# Patient Record
Sex: Male | Born: 1950
Health system: Southern US, Community
[De-identification: ages and names within clinical notes are randomized; demographics above are authoritative.]

## PROBLEM LIST (undated history)

## (undated) DIAGNOSIS — I639 Cerebral infarction, unspecified: Secondary | ICD-10-CM

## (undated) HISTORY — DX: Cerebral infarction, unspecified: I63.9

---

## 2019-12-31 ENCOUNTER — Emergency Department: Payer: Medicare Other

## 2019-12-31 ENCOUNTER — Inpatient Hospital Stay
Admission: EM | Admit: 2019-12-31 | Discharge: 2020-01-09 | DRG: 064 | Disposition: A | Discharge status: Rehabilitation Facility | Payer: Medicare Other | Source: Skilled Nursing Facility | Attending: Internal Medicine | Admitting: Internal Medicine

## 2019-12-31 ENCOUNTER — Other Ambulatory Visit: Payer: Self-pay

## 2019-12-31 DIAGNOSIS — J9811 Atelectasis: Secondary | ICD-10-CM | POA: Diagnosis present

## 2019-12-31 DIAGNOSIS — Z23 Encounter for immunization: Secondary | ICD-10-CM | POA: Diagnosis not present

## 2019-12-31 DIAGNOSIS — A09 Infectious gastroenteritis and colitis, unspecified: Secondary | ICD-10-CM | POA: Diagnosis present

## 2019-12-31 DIAGNOSIS — N179 Acute kidney failure, unspecified: Secondary | ICD-10-CM | POA: Diagnosis present

## 2019-12-31 DIAGNOSIS — H1032 Unspecified acute conjunctivitis, left eye: Secondary | ICD-10-CM | POA: Diagnosis present

## 2019-12-31 DIAGNOSIS — E872 Acidosis, unspecified: Secondary | ICD-10-CM

## 2019-12-31 DIAGNOSIS — N400 Enlarged prostate without lower urinary tract symptoms: Secondary | ICD-10-CM | POA: Diagnosis not present

## 2019-12-31 DIAGNOSIS — R338 Other retention of urine: Secondary | ICD-10-CM | POA: Diagnosis present

## 2019-12-31 DIAGNOSIS — G463 Brain stem stroke syndrome: Secondary | ICD-10-CM | POA: Diagnosis not present

## 2019-12-31 DIAGNOSIS — I63542 Cerebral infarction due to unspecified occlusion or stenosis of left cerebellar artery: Secondary | ICD-10-CM | POA: Diagnosis present

## 2019-12-31 DIAGNOSIS — R1312 Dysphagia, oropharyngeal phase: Secondary | ICD-10-CM | POA: Diagnosis not present

## 2019-12-31 DIAGNOSIS — N401 Enlarged prostate with lower urinary tract symptoms: Secondary | ICD-10-CM | POA: Diagnosis present

## 2019-12-31 DIAGNOSIS — E876 Hypokalemia: Secondary | ICD-10-CM | POA: Diagnosis present

## 2019-12-31 DIAGNOSIS — H532 Diplopia: Secondary | ICD-10-CM | POA: Diagnosis not present

## 2019-12-31 DIAGNOSIS — G510 Bell's palsy: Secondary | ICD-10-CM | POA: Diagnosis not present

## 2019-12-31 DIAGNOSIS — H9190 Unspecified hearing loss, unspecified ear: Secondary | ICD-10-CM | POA: Diagnosis not present

## 2019-12-31 DIAGNOSIS — H5500 Unspecified nystagmus: Secondary | ICD-10-CM | POA: Diagnosis present

## 2019-12-31 DIAGNOSIS — K5900 Constipation, unspecified: Secondary | ICD-10-CM | POA: Diagnosis present

## 2019-12-31 DIAGNOSIS — G9341 Metabolic encephalopathy: Secondary | ICD-10-CM | POA: Diagnosis present

## 2019-12-31 DIAGNOSIS — R414 Neurologic neglect syndrome: Secondary | ICD-10-CM | POA: Diagnosis present

## 2019-12-31 DIAGNOSIS — I69391 Dysphagia following cerebral infarction: Secondary | ICD-10-CM

## 2019-12-31 DIAGNOSIS — K529 Noninfective gastroenteritis and colitis, unspecified: Secondary | ICD-10-CM

## 2019-12-31 DIAGNOSIS — I2510 Atherosclerotic heart disease of native coronary artery without angina pectoris: Secondary | ICD-10-CM | POA: Diagnosis present

## 2019-12-31 DIAGNOSIS — I639 Cerebral infarction, unspecified: Secondary | ICD-10-CM

## 2019-12-31 DIAGNOSIS — F0150 Vascular dementia without behavioral disturbance: Secondary | ICD-10-CM | POA: Diagnosis not present

## 2019-12-31 DIAGNOSIS — I10 Essential (primary) hypertension: Secondary | ICD-10-CM

## 2019-12-31 DIAGNOSIS — R569 Unspecified convulsions: Secondary | ICD-10-CM | POA: Diagnosis present

## 2019-12-31 DIAGNOSIS — A419 Sepsis, unspecified organism: Secondary | ICD-10-CM

## 2019-12-31 DIAGNOSIS — I69354 Hemiplegia and hemiparesis following cerebral infarction affecting left non-dominant side: Secondary | ICD-10-CM | POA: Diagnosis present

## 2019-12-31 DIAGNOSIS — R471 Dysarthria and anarthria: Secondary | ICD-10-CM | POA: Diagnosis present

## 2019-12-31 DIAGNOSIS — R4182 Altered mental status, unspecified: Secondary | ICD-10-CM

## 2019-12-31 DIAGNOSIS — Z20822 Contact with and (suspected) exposure to covid-19: Secondary | ICD-10-CM | POA: Diagnosis present

## 2019-12-31 DIAGNOSIS — E785 Hyperlipidemia, unspecified: Secondary | ICD-10-CM

## 2019-12-31 DIAGNOSIS — I63531 Cerebral infarction due to unspecified occlusion or stenosis of right posterior cerebral artery: Secondary | ICD-10-CM | POA: Diagnosis not present

## 2019-12-31 DIAGNOSIS — R29724 NIHSS score 24: Secondary | ICD-10-CM | POA: Diagnosis not present

## 2019-12-31 DIAGNOSIS — I6389 Other cerebral infarction: Secondary | ICD-10-CM | POA: Diagnosis not present

## 2019-12-31 DIAGNOSIS — M79671 Pain in right foot: Secondary | ICD-10-CM | POA: Diagnosis not present

## 2019-12-31 DIAGNOSIS — K5901 Slow transit constipation: Secondary | ICD-10-CM | POA: Diagnosis not present

## 2019-12-31 DIAGNOSIS — R110 Nausea: Secondary | ICD-10-CM | POA: Diagnosis not present

## 2019-12-31 DIAGNOSIS — G450 Vertebro-basilar artery syndrome: Secondary | ICD-10-CM | POA: Diagnosis not present

## 2019-12-31 DIAGNOSIS — Z79899 Other long term (current) drug therapy: Secondary | ICD-10-CM

## 2019-12-31 DIAGNOSIS — E860 Dehydration: Secondary | ICD-10-CM

## 2019-12-31 DIAGNOSIS — I69318 Other symptoms and signs involving cognitive functions following cerebral infarction: Secondary | ICD-10-CM | POA: Diagnosis not present

## 2019-12-31 DIAGNOSIS — R739 Hyperglycemia, unspecified: Secondary | ICD-10-CM | POA: Diagnosis present

## 2019-12-31 DIAGNOSIS — Z7982 Long term (current) use of aspirin: Secondary | ICD-10-CM

## 2019-12-31 DIAGNOSIS — Z7902 Long term (current) use of antithrombotics/antiplatelets: Secondary | ICD-10-CM

## 2019-12-31 DIAGNOSIS — H510 Palsy (spasm) of conjugate gaze: Secondary | ICD-10-CM | POA: Diagnosis present

## 2019-12-31 DIAGNOSIS — N39498 Other specified urinary incontinence: Secondary | ICD-10-CM | POA: Diagnosis not present

## 2019-12-31 DIAGNOSIS — R000 Tachycardia, unspecified: Secondary | ICD-10-CM | POA: Diagnosis not present

## 2019-12-31 DIAGNOSIS — I69320 Aphasia following cerebral infarction: Secondary | ICD-10-CM | POA: Diagnosis not present

## 2019-12-31 DIAGNOSIS — G8929 Other chronic pain: Secondary | ICD-10-CM | POA: Diagnosis not present

## 2019-12-31 DIAGNOSIS — R5381 Other malaise: Secondary | ICD-10-CM | POA: Diagnosis present

## 2019-12-31 DIAGNOSIS — I69328 Other speech and language deficits following cerebral infarction: Secondary | ICD-10-CM | POA: Diagnosis not present

## 2019-12-31 DIAGNOSIS — R6520 Severe sepsis without septic shock: Secondary | ICD-10-CM | POA: Diagnosis present

## 2019-12-31 DIAGNOSIS — I69398 Other sequelae of cerebral infarction: Secondary | ICD-10-CM | POA: Diagnosis not present

## 2019-12-31 DIAGNOSIS — A4151 Sepsis due to Escherichia coli [E. coli]: Secondary | ICD-10-CM | POA: Diagnosis present

## 2019-12-31 DIAGNOSIS — H5589 Other irregular eye movements: Secondary | ICD-10-CM | POA: Diagnosis present

## 2019-12-31 DIAGNOSIS — D62 Acute posthemorrhagic anemia: Secondary | ICD-10-CM | POA: Diagnosis not present

## 2019-12-31 DIAGNOSIS — I69322 Dysarthria following cerebral infarction: Secondary | ICD-10-CM | POA: Diagnosis not present

## 2019-12-31 LAB — CBC
HCT: 42.5 % (ref 39.0–52.0)
Hemoglobin: 14.6 g/dL (ref 13.0–17.0)
MCH: 30.6 pg (ref 26.0–34.0)
MCHC: 34.4 g/dL (ref 30.0–36.0)
MCV: 89.1 fL (ref 80.0–100.0)
Platelets: 382 10*3/uL (ref 150–400)
RBC: 4.77 MIL/uL (ref 4.22–5.81)
RDW: 12.8 % (ref 11.5–15.5)
WBC: 10.5 10*3/uL (ref 4.0–10.5)
nRBC: 0 % (ref 0.0–0.2)

## 2019-12-31 LAB — APTT: aPTT: 29 seconds (ref 24–36)

## 2019-12-31 LAB — COMPREHENSIVE METABOLIC PANEL
ALT: 19 U/L (ref 0–44)
AST: 22 U/L (ref 15–41)
Albumin: 3.7 g/dL (ref 3.5–5.0)
Alkaline Phosphatase: 98 U/L (ref 38–126)
Anion gap: 12 (ref 5–15)
BUN: 23 mg/dL (ref 8–23)
CO2: 19 mmol/L — ABNORMAL LOW (ref 22–32)
Calcium: 9.6 mg/dL (ref 8.9–10.3)
Chloride: 108 mmol/L (ref 98–111)
Creatinine, Ser: 1.48 mg/dL — ABNORMAL HIGH (ref 0.61–1.24)
GFR, Estimated: 51 mL/min — ABNORMAL LOW (ref >60)
Glucose, Bld: 169 mg/dL — ABNORMAL HIGH (ref 70–99)
Potassium: 3.7 mmol/L (ref 3.5–5.1)
Sodium: 139 mmol/L (ref 135–145)
Total Bilirubin: 1 mg/dL (ref 0.3–1.2)
Total Protein: 7.9 g/dL (ref 6.5–8.1)

## 2019-12-31 LAB — URINALYSIS, COMPLETE (UACMP) WITH MICROSCOPIC
Bacteria, UA: NONE SEEN
Bilirubin Urine: NEGATIVE
Glucose, UA: 50 mg/dL — AB
Ketones, ur: 20 mg/dL — AB
Leukocytes,Ua: NEGATIVE
Nitrite: NEGATIVE
Protein, ur: NEGATIVE mg/dL
Specific Gravity, Urine: 1.04 — ABNORMAL HIGH (ref 1.005–1.030)
Squamous Epithelial / HPF: NONE SEEN (ref 0–5)
pH: 5 (ref 5.0–8.0)

## 2019-12-31 LAB — LACTIC ACID, PLASMA: Lactic Acid, Venous: 4.4 mmol/L (ref 0.5–1.9)

## 2019-12-31 LAB — RESP PANEL BY RT-PCR (FLU A&B, COVID) ARPGX2
Influenza A by PCR: NEGATIVE
Influenza B by PCR: NEGATIVE
SARS Coronavirus 2 by RT PCR: NEGATIVE

## 2019-12-31 LAB — AMMONIA: Ammonia: 13 umol/L (ref 9–35)

## 2019-12-31 LAB — PROTIME-INR
INR: 1.1 (ref 0.8–1.2)
Prothrombin Time: 14 seconds (ref 11.4–15.2)

## 2019-12-31 LAB — LIPASE, BLOOD: Lipase: 33 U/L (ref 11–51)

## 2019-12-31 MED ORDER — LEVETIRACETAM IN NACL 1000 MG/100ML IV SOLN
1000.0000 mg | Freq: Once | INTRAVENOUS | Status: AC
  Administered 2019-12-31: 1000 mg via INTRAVENOUS
  Filled 2019-12-31: qty 100

## 2019-12-31 MED ORDER — ONDANSETRON HCL 4 MG PO TABS: 4.0000 mg | ORAL_TABLET | Freq: Four times a day (QID) | ORAL | Status: DC | PRN

## 2019-12-31 MED ORDER — METRONIDAZOLE 500 MG PO TABS
500.0000 mg | ORAL_TABLET | Freq: Three times a day (TID) | ORAL | Status: DC
  Administered 2020-01-01: 500 mg via ORAL
  Filled 2019-12-31: qty 1

## 2019-12-31 MED ORDER — LACTATED RINGERS IV BOLUS
1000.0000 mL | Freq: Once | INTRAVENOUS | Status: AC
  Administered 2019-12-31: 1000 mL via INTRAVENOUS

## 2019-12-31 MED ORDER — ACETAMINOPHEN 650 MG RE SUPP: 650.0000 mg | Freq: Four times a day (QID) | RECTAL | Status: DC | PRN

## 2019-12-31 MED ORDER — ERYTHROMYCIN 5 MG/GM OP OINT
TOPICAL_OINTMENT | Freq: Once | OPHTHALMIC | Status: DC
  Filled 2019-12-31: qty 1

## 2019-12-31 MED ORDER — IOHEXOL 300 MG/ML  SOLN
100.0000 mL | Freq: Once | INTRAMUSCULAR | Status: AC | PRN
  Administered 2019-12-31: 100 mL via INTRAVENOUS

## 2019-12-31 MED ORDER — ACETAMINOPHEN 325 MG PO TABS: 650.0000 mg | ORAL_TABLET | Freq: Four times a day (QID) | ORAL | Status: DC | PRN

## 2019-12-31 MED ORDER — SODIUM CHLORIDE 0.9 % IV SOLN
2.0000 g | INTRAVENOUS | Status: AC
  Administered 2020-01-01: 2 g via INTRAVENOUS
  Filled 2019-12-31: qty 2

## 2019-12-31 MED ORDER — SODIUM CHLORIDE 0.9 % IV SOLN: INTRAVENOUS | Status: DC

## 2019-12-31 MED ORDER — ONDANSETRON HCL 4 MG/2ML IJ SOLN: 4.0000 mg | Freq: Four times a day (QID) | INTRAMUSCULAR | Status: DC | PRN

## 2019-12-31 MED ORDER — IPRATROPIUM-ALBUTEROL 0.5-2.5 (3) MG/3ML IN SOLN
3.0000 mL | Freq: Four times a day (QID) | RESPIRATORY_TRACT | Status: DC | PRN
  Filled 2019-12-31 (×2): qty 3

## 2019-12-31 MED ORDER — ENOXAPARIN SODIUM 40 MG/0.4ML ~~LOC~~ SOLN
40.0000 mg | SUBCUTANEOUS | Status: DC
  Administered 2020-01-01 – 2020-01-09 (×9): 40 mg via SUBCUTANEOUS
  Filled 2019-12-31 (×9): qty 0.4

## 2019-12-31 MED ORDER — METRONIDAZOLE IN NACL 5-0.79 MG/ML-% IV SOLN
500.0000 mg | Freq: Once | INTRAVENOUS | Status: AC
  Administered 2020-01-01: 500 mg via INTRAVENOUS
  Filled 2019-12-31: qty 100

## 2019-12-31 NOTE — ED Provider Notes (Addendum)
[facility] Emergency Department Provider Note  ____________________________________________   First MD Initiated Contact with Patient 12/31/19 2039     (approximate)  I have reviewed the triage vital signs and the nursing notes.   HISTORY  Chief Complaint Abdominal Pain and Constipation   HPI Alexander Byrd is a 69 y.o. 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  Patient is altered on arrival and unable to write any verbal history.  He is accompanied by his sister who states that typically his able to speak and only has weakness in his left side.  She states that when he attempted to eat today he had some subsequent vomiting that she thought was little dark but not clearly bloody. No other history is immediately available from patient.         History reviewed. No pertinent past medical history.  Patient Active Problem List   Diagnosis Date Noted  . Sepsis (HCC) 12/31/2019      Prior to Admission medications   Medication Sig Start Date End Date Taking? Authorizing Provider  Amino Acids-Protein Hydrolys (FEEDING SUPPLEMENT, PRO-STAT SUGAR FREE 64,) LIQD Take 30 mLs by mouth daily.   Yes [provider]  amLODipine (NORVASC) 5 MG tablet Take 5 mg by mouth daily.   Yes [provider]  aspirin 81 MG chewable tablet Chew 81 mg by mouth daily.   Yes [provider]  atorvastatin (LIPITOR) 80 MG tablet Take 80 mg by mouth daily at 6 PM.   Yes [provider]  cromolyn (OPTICROM) 4 % ophthalmic solution Place 2 drops into the left eye 4 (four) times daily. 0600, 1200, 1800, 0000 12/28/19 01/12/20 Yes [provider]  fluticasone (FLONASE) 50 MCG/ACT nasal spray Place 2 sprays into both nostrils daily.   Yes [provider]  lisinopril (ZESTRIL) 40 MG tablet Take 40 mg by mouth daily.   Yes [provider]  loratadine (CLARITIN) 10 MG tablet Take 10 mg by mouth daily.   Yes [provider]  polyethylene glycol (MIRALAX / GLYCOLAX) 17 g packet Take 17 g by mouth daily. MIX IN 4-8 OZ OF FLUID   Yes [provider]  senna-docusate (SENOKOT-S) 8.6-50 MG tablet Take 2 tablets by mouth 2 (two) times daily.   Yes [provider]  clopidogrel (PLAVIX) 75 MG tablet Take 75 mg by mouth daily. Patient not taking: Reported on 12/31/2019    [provider]    Allergies Patient has no known allergies.  No family history on file.  Social History Social History   Tobacco Use  . Smoking status: Not on file  Substance Use Topics  . Alcohol use: Not on file  . Drug use: Not on file    Review of Systems  Review of Systems  Unable to perform ROS: Mental status change      ____________________________________________   PHYSICAL EXAM:  VITAL SIGNS: ED Triage Vitals  Enc Vitals Group     BP 12/31/19 2010 (!) 127/59     Pulse Rate 12/31/19 2010 (!) 123     Resp 12/31/19 2010 (!) 42     Temp 12/31/19 2010 98.9 F (37.2 C)     Temp Source 12/31/19 2010 Oral     SpO2 12/31/19 2010 100 %  Weight 12/31/19 2008 165 lb (74.8 kg)     Height --      Head Circumference --      Peak Flow --      Pain Score --      Pain Loc --      Pain Edu? --      Excl. in GC? --    Vitals:   01/01/20 1015 01/01/20 1030  BP: (!) 145/88   Pulse: 92   Resp: (!) 22 20  Temp:    SpO2: 99%    Physical Exam Vitals and nursing note reviewed.  Constitutional:      Appearance: He is well-developed. He is ill-appearing.  HENT:     Head: Normocephalic and atraumatic.     Right Ear: External ear normal.     Left Ear: External ear normal.     Nose: Nose normal.     Mouth/Throat:     Mouth: Mucous membranes are dry.  Eyes:     Conjunctiva/sclera: Conjunctivae  normal.  Cardiovascular:     Rate and Rhythm: Regular rhythm. Tachycardia present.     Heart sounds: No murmur heard.   Pulmonary:     Effort: Pulmonary effort is normal. No respiratory distress.     Breath sounds: Normal breath sounds.  Abdominal:     General: There is distension.     Tenderness: There is generalized abdominal tenderness. There is no right CVA tenderness or left CVA tenderness.     Hernia: No hernia is present.  Musculoskeletal:     Cervical back: Neck supple.     Right lower leg: No edema.     Left lower leg: No edema.  Skin:    General: Skin is warm and dry.     Capillary Refill: Capillary refill takes 2 to 3 seconds.  Neurological:     Mental Status: He is lethargic.     Cranial Nerves: Cranial nerve deficit present.     PERRLA. EOMI. left-sided facial droop which is sister states is chronic. Patient does have some purulent drainage from his left eye. He withdraws extremities to noxious stimuli and will moan to sternal rub but does not otherwise participate in neuro exam. ____________________________________________   LABS (all labs ordered are listed, but only abnormal results are displayed)  Labs Reviewed  GASTROINTESTINAL PANEL BY PCR, STOOL (REPLACES STOOL CULTURE) - Abnormal; Notable for the following components:      Result Value   Enteropathogenic E coli (EPEC) DETECTED (*)    All other components within normal limits  COMPREHENSIVE METABOLIC PANEL - Abnormal; Notable for the following components:   CO2 19 (*)    Glucose, Bld 169 (*)    Creatinine, Ser 1.48 (*)    GFR, Estimated 51 (*)    All other components within normal limits  URINALYSIS, COMPLETE (UACMP) WITH MICROSCOPIC - Abnormal; Notable for the following components:   Color, Urine YELLOW (*)    APPearance HAZY (*)    Specific Gravity, Urine 1.040 (*)    Glucose, UA 50 (*)    Hgb urine dipstick SMALL (*)    Ketones, ur 20 (*)    All other components within normal limits  LACTIC  ACID, PLASMA - Abnormal; Notable for the following components:   Lactic Acid, Venous 4.4 (*)    All other components within normal limits  LACTIC ACID, PLASMA - Abnormal; Notable for the following components:   Lactic Acid, Venous 6.0 (*)    All other components within  normal limits  COMPREHENSIVE METABOLIC PANEL - Abnormal; Notable for the following components:   CO2 18 (*)    Glucose, Bld 126 (*)    Creatinine, Ser 1.31 (*)    Albumin 3.1 (*)    GFR, Estimated 59 (*)    All other components within normal limits  CBC - Abnormal; Notable for the following components:   RBC 4.14 (*)    Hemoglobin 12.6 (*)    HCT 36.6 (*)    All other components within normal limits  LACTIC ACID, PLASMA - Abnormal; Notable for the following components:   Lactic Acid, Venous 4.5 (*)    All other components within normal limits  LACTIC ACID, PLASMA - Abnormal; Notable for the following components:   Lactic Acid, Venous 2.6 (*)    All other components within normal limits  RESP PANEL BY RT-PCR (FLU A&B, COVID) ARPGX2  C DIFFICILE QUICK SCREEN W PCR REFLEX  URINE CULTURE  CBC  LIPASE, BLOOD  PROTIME-INR  APTT  AMMONIA  HIV ANTIBODY (ROUTINE TESTING W REFLEX)   ____________________________________________  EKG  Sinus tachycardia with a ventricular rate of 119, normal axis, unremarkable intervals, PVCs, no clear evidence of acute ischemia. ____________________________________________  RADIOLOGY  ED MD interpretation: CT head is unremarkable for evidence of acute intracranial process.  CT abdomen pelvis shows evidence of intra-Tritus without other clear acute intra-abdominal pathology including evidence of pyelonephritis, SBO, appendicitis, pancreatitis or acute cholecystitis.  Chest x-ray shows no evidence of pneumonia or other acute intrathoracic process.  Official radiology report(s): CT Head Wo Contrast  Result Date: 12/31/2019 CLINICAL DATA:  Seizure nontraumatic. Unresponsive. Rhythmic  eye movements. EXAM: CT HEAD WITHOUT CONTRAST TECHNIQUE: Contiguous axial images were obtained from the base of the skull through the vertex without intravenous contrast. COMPARISON:  CT head 12/09/2019. FINDINGS: Brain: Patchy and confluent areas of decreased attenuation are noted throughout the deep and periventricular white matter of the cerebral hemispheres bilaterally, compatible with chronic microvascular ischemic disease. Redemonstration of a left lacunar infarction. No evidence of large-territorial acute infarction. No parenchymal hemorrhage. No mass lesion. No extra-axial collection. No mass effect or midline shift. No hydrocephalus. Basilar cisterns are patent. Vascular: No hyperdense vessel. Skull: No acute fracture or focal lesion. Sinuses/Orbits: Paranasal sinuses and mastoid air cells are clear. The orbits are unremarkable. Other: None. IMPRESSION: No acute intracranial abnormality. Electronically Signed   By: Tish Frederickson M.D.   On: 12/31/2019 21:50   MR BRAIN WO CONTRAST  Result Date: 01/01/2020 CLINICAL DATA:  Initial evaluation for acute seizure. EXAM: MRI HEAD WITHOUT CONTRAST TECHNIQUE: Multiplanar, multiecho pulse sequences of the brain and surrounding structures were obtained without intravenous contrast. COMPARISON:  Prior head CT from 12/31/2019. FINDINGS: Brain: Examination moderately degraded by motion artifact. Diffuse prominence of the CSF containing spaces compatible generalized age-related cerebral atrophy. Six patchy and confluent T2/FLAIR hyperintensity within the periventricular deep white matter both cerebral hemispheres most consistent with chronic small vessel ischemic disease, moderate in nature. Few scatter remote lacunar infarcts present about the left basal ganglia/corona radiata. There is subtly increased diffusion signal abnormality involving the pons, with extension into the left middle cerebellar peduncle (series 5, image 13). Additionally, there is subtly increased  diffuse diffusion abnormality within the adjacent left cerebellar hemisphere (series 5, images 11, 9). Subtly increased associated FLAIR signal abnormality seen as well (series 13, images 8, 7, 6). No significant regional mass effect or edema. Adjacent fourth ventricle remains widely patent as do the basilar cisterns. No associated susceptibility artifact  to suggest hemorrhage. Findings are nonspecific. No other abnormal foci of restricted diffusion to suggest acute or subacute ischemia. Gray-white matter differentiation otherwise maintained. No foci of susceptibility artifact to suggest acute or chronic intracranial hemorrhage. No mass lesion, mass effect, or midline shift. Ventricles normal size without hydrocephalus. No extra-axial fluid collection. Pituitary gland and suprasellar region within normal limits. Midline structures grossly intact. Vascular: Major intracranial vascular flow voids are grossly maintained at the skull base. Diminutive vertebrobasilar system noted, suggesting fetal type origin of the PCAs. Skull and upper cervical spine: Craniocervical junction grossly within normal limits. No focal marrow replacing lesion. No scalp soft tissue abnormality. Sinuses/Orbits: Globes and orbital soft tissues within normal limits. Paranasal sinuses are clear. No mastoid effusion. Inner ear structures grossly normal. Other: None. IMPRESSION: 1. Technically limited exam due to extensive motion artifact. 2. Subtly increased diffusion abnormality involving the pons, left middle cerebellar peduncle, and adjacent left cerebellar hemisphere as above. Findings are nonspecific, but could reflect changes related to acute seizure given provided history. Correlation with EEG suggested. Differential considerations include changes related to acute/subacute ischemia versus a nonspecific cerebritis/encephalitis. Correlation with LP and CSF values also suggested as clinically warranted. Changes of PRES are considered, although  felt to be less likely given the relative lack of additional changes elsewhere within the brain. 3. No other acute intracranial abnormality. 4. Age-related cerebral atrophy with moderate chronic small vessel ischemic disease, with a few scattered remote lacunar infarcts about the left basal ganglia/corona radiata. Electronically Signed   By: Rise Mu M.D.   On: 01/01/2020 01:56   CT ABDOMEN PELVIS W CONTRAST  Result Date: 12/31/2019 CLINICAL DATA:  Abdominal distension. EXAM: CT ABDOMEN AND PELVIS WITH CONTRAST TECHNIQUE: Multidetector CT imaging of the abdomen and pelvis was performed using the standard protocol following bolus administration of intravenous contrast. CONTRAST:  OMNIPAQUE IOHEXOL 300 MG/ML  SOLN COMPARISON:  None. FINDINGS: Lower chest: Linear atelectasis versus scarring within the right lower lobe. Otherwise no acute abnormality. Hepatobiliary: There is a 2.6 cm fluid density lesion within the right hepatic lobe that likely represents a simple hepatic cyst (2:12). There is a subjacent subcentimeter hypodensity that is too small to characterize. Pancreas: No focal lesion. Normal pancreatic contour. No surrounding inflammatory changes. No main pancreatic ductal dilatation. Spleen: Normal in size without focal abnormality. Adrenals/Urinary Tract: No adrenal nodule bilaterally. Bilateral kidneys enhance symmetrically. No hydronephrosis. No hydroureter. The urinary bladder is unremarkable. Stomach/Bowel: Stomach is within normal limits. Several loops of distal small bowel demonstrates circumferential bowel wall thickening as well as submucosal hyperenhancement. No small bowel dilatation. No pneumatosis. No evidence of large bowel wall thickening or dilatation. Appendix appears normal. Vascular/Lymphatic: No abdominal aorta or iliac aneurysm. No abdominal, pelvic, or inguinal lymphadenopathy. Reproductive: The prostate gland is enlarged measuring up to 5.3 cm. Other: No  intraperitoneal free fluid. No intraperitoneal free gas. No organized fluid collection. Musculoskeletal: No abdominal wall hernia or abnormality No suspicious lytic or blastic osseous lesions. Old healed bilateral anterior rib fractures. No acute displaced fracture. Multilevel degenerative changes of the spine with intervertebral disc space vacuum phenomenon endplate sclerosis at the L5-S1 level. IMPRESSION: 1. Findings suggestive of enteritis. 2. Prostatomegaly.  Consider correlating with PSA levels. Electronically Signed   By: Tish Frederickson M.D.   On: 12/31/2019 22:01   DG Chest Port 1 View  Result Date: 12/31/2019 CLINICAL DATA:  Unresponsive EXAM: PORTABLE CHEST 1 VIEW COMPARISON:  December 09, 2019 FINDINGS: The heart size and mediastinal contours are within  normal limits. Again noted is slight elevation of the right hemidiaphragm. Subsegmental atelectasis seen at the right lung base. The visualized skeletal structures are unremarkable. IMPRESSION: Subsegmental atelectasis at the right lung base. Electronically Signed   By: Jonna Clark M.D.   On: 12/31/2019 21:06    ____________________________________________   PROCEDURES  Procedure(s) performed (including Critical Care):  .Critical Care Performed by: Gilles Chiquito, MD Authorized by: Gilles Chiquito, MD   Critical care provider statement:    Critical care time (minutes):  45   Critical care time was exclusive of:  Separately billable procedures and treating other patients   Critical care was necessary to treat or prevent imminent or life-threatening deterioration of the following conditions:  Sepsis, shock and CNS failure or compromise   Critical care was time spent personally by me on the following activities:  Discussions with consultants, evaluation of patient's response to treatment, examination of patient, ordering and performing treatments and interventions, ordering and review of laboratory studies, ordering and review of  radiographic studies, pulse oximetry, re-evaluation of patient's condition, obtaining history from patient or surrogate and review of old charts     ____________________________________________   INITIAL IMPRESSION / ASSESSMENT AND PLAN / ED COURSE        Patient presents with [place] to history exam for assessment of some abdominal distention and pain as well as vomiting reported by sister for last couple days.  She also states that she thinks he may be constipated as she does not think he had a bowel movement.  On arrival patient is altered and unable provide any history.  Given he is tachycardic and tachypneic with a rate of 42 on arrival and altered broad work-up initiated. History or exam findings to suggest an acute traumatic injury. Low suspicion for toxic ingestion. CT head obtained shows no evidence of intracranial hemorrhage or other clear acute intracranial process. Chest x-ray shows no evidence of pneumonia or other acute intrathoracic process. CT abdomen pelvis shows findings concerning for enteritis as well as some prostamegaly. No evidence of SBO, intracranial mass, pancreatitis, acute cholecystitis, pneumatosis, pyelonephritis, or other clear acute intra-abdominal or pelvic process.  CMP remarkable for nongap acidosis with a bicarb of 19 as well as possible AKI with decreased kidney function with a creatinine of 1.48. Patient is also mildly hyperglycemic with a glucose of 169 but have a low suspicion for DKA at this time given anion gap of 12. Lipase is 33 not consistent with pancreatitis. Covid and influenza are negative. Initial lactic acid is 4.4. Ammonia is unremarkable.UA is not suggestive of urinary source for infection.   While undergoing evaluation emergency room patient was noted to have some rhythmic eye movements concerning for possible seizure by RN who witnessed this episode. My assessment patient has no abnormal movements and no seizure-like activity. However will give 1 g  of Keppra as possible difficult to rule out at this time. Given tachycardia and tachypnea with elevated lactic acid patient was 1x dose of given broad-spectrum antibioticsas well as IV fluidsI suspect tachycardia and lactic more likely secondary to possible seizure given no fever and no elevated of WBC count.   Drainage from left eye most consistent with bacterial conjunctivitis. Romycin ordered in ED.  Patient will be admitted to medicine service for further evaluation management of encephalopathy and possible seizure in the setting of enteritis.   ____________________________________________   FINAL CLINICAL IMPRESSION(S) / ED DIAGNOSES  Final diagnoses:  Altered mental status, unspecified altered mental status type  Acute bacterial conjunctivitis of left eye  Enteritis  Lactic acid acidosis  Dehydration    Medications  erythromycin ophthalmic ointment (0 application Left Eye Hold 01/01/20 0319)  enoxaparin (LOVENOX) injection 40 mg (has no administration in time range)  0.9 %  sodium chloride infusion ( Intravenous Rate/Dose Change 01/01/20 0319)  acetaminophen (TYLENOL) tablet 650 mg (has no administration in time range)    Or  acetaminophen (TYLENOL) suppository 650 mg (has no administration in time range)  ondansetron (ZOFRAN) tablet 4 mg (has no administration in time range)    Or  ondansetron (ZOFRAN) injection 4 mg (has no administration in time range)  metroNIDAZOLE (FLAGYL) tablet 500 mg (0 mg Oral Hold 01/01/20 0731)  aspirin chewable tablet 81 mg (has no administration in time range)  amLODipine (NORVASC) tablet 5 mg (has no administration in time range)  atorvastatin (LIPITOR) tablet 80 mg (has no administration in time range)  fluticasone (FLONASE) 50 MCG/ACT nasal spray 2 spray (has no administration in time range)  loratadine (CLARITIN) tablet 10 mg (has no administration in time range)  cromolyn (OPTICROM) 4 % ophthalmic solution 2 drop (has no administration in  time range)  ipratropium-albuterol (DUONEB) 0.5-2.5 (3) MG/3ML nebulizer solution 3 mL (has no administration in time range)  levETIRAcetam (KEPPRA) IVPB 1000 mg/100 mL premix (0 mg Intravenous Stopped 01/01/20 0832)  ceFEPIme (MAXIPIME) 2 g in sodium chloride 0.9 % 100 mL IVPB (0 g Intravenous Stopped 01/01/20 0909)  lactated ringers bolus 1,000 mL (0 mLs Intravenous Stopped 12/31/19 2247)  iohexol (OMNIPAQUE) 300 MG/ML solution 100 mL (100 mLs Intravenous Contrast Given 12/31/19 2128)  ceFEPIme (MAXIPIME) 2 g in sodium chloride 0.9 % 100 mL IVPB ( Intravenous Stopped 01/01/20 0047)  metroNIDAZOLE (FLAGYL) IVPB 500 mg ( Intravenous Stopped 01/01/20 0053)  lactated ringers bolus 1,000 mL (0 mLs Intravenous Stopped 01/01/20 0149)  levETIRAcetam (KEPPRA) IVPB 1000 mg/100 mL premix ( Intravenous Stopped 01/01/20 0000)  LORazepam (ATIVAN) injection 1 mg (1 mg Intravenous Given 01/01/20 0013)  lactated ringers bolus 500 mL (0 mLs Intravenous Stopped 01/01/20 0700)     ED Discharge Orders    None       Note:  This document was prepared using Dragon voice recognition software and may include unintentional dictation errors.   Gilles ChiquitoSmith, Scarlettrose Costilow P, MD 12/31/19 2257    Gilles ChiquitoSmith, Quantavius Humm P, MD 01/01/20 1027    Gilles ChiquitoSmith, Maricela Kawahara P, MD 01/01/20 1039

## 2019-12-31 NOTE — H&P (Addendum)
History and Physical    Alexander Byrd QPY:[id number] DOB: 1951/01/24 DOA: 12/31/2019  I have briefly reviewed the patient's prior medical records in [place]  PCP: Alexander Bill., MD  Patient coming from: SNF  Chief Complaint: Abdominal pain, nausea and vomiting  HPI: Alexander Byrd is a 69 y.o. 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  Patient sister is at bedside and she tells me that he was at his normal self yesterday when family visited, when she went in today he was little bit more confused, was intermittently complaining of abdominal pain and had an episode of nausea and vomiting.  She also mentions that his abdomen is slightly more distended than his normal.  There is no reported fever or chills, no reports of chest pain.  Sister also tells me that he is more confused today  ED Course: In the ED patient met criteria for sepsis with tachypnea and tachycardia, elevated lactic acid of 4.4.  His blood pressure is stable.  Blood work reveals a creatinine of 1.48, glucose 169, lactic acid 4.4.  His WBC is normal.  Covid testing is negative.  Had a chest x-ray which showed subsegmental atelectasis at the right lung base, CT scan of the head was unremarkable and a CT abdomen pelvis showed findings consistent with enteritis as well as prostatomegaly.  He was given antibiotics with cefepime, metronidazole, IV fluids and we are asked to admit.  Urinalysis does not show any bacteria, no leukocytes or nitrates.  Of note, bedside RN in the ED reports concern for seizure activity as patient had a right gaze deviation and fast beating nystagmus.  No seizure-like movements  Review of Systems: Unable to obtain full review of systems given confusion  Unable to obtain  medical history from the patient given confusion  Unable to obtain social history given confusion  Not on File  Unable to obtain family history given confusion  Prior to Admission medications   Not on File    Physical Exam: Vitals:   12/31/19 2115 12/31/19 2230 12/31/19 2231 12/31/19 2245  BP: 133/85 (!) 143/87  (!) 159/83  Pulse: (!) 108 (!) 105  (!) 103  Resp: (!) 27 (!) 27  (!) 32  Temp:      TempSrc:      SpO2: 100% 100%  100%  Weight:   74.8 kg   Height:   6' (1.829 m)     Constitutional: Appears tachypneic Eyes: No scleral icterus ENMT: Mucous membranes are dry Neck: normal, supple Respiratory: clear to auscultation bilaterally, no wheezing, no crackles. Normal respiratory effort.  Tachypneic Cardiovascular: Regular rate and rhythm, no murmurs / rubs / gallops. No extremity edema. Abdomen: Appears mildly distended, appears tender to palpation diffusely without guarding or rebound.  Bowel sounds positive. Musculoskeletal: no clubbing / cyanosis. Normal muscle tone.  Skin: no rashes, lesions, ulcers. No induration Neurologic: Mild left-sided weakness but otherwise nonfocal, patient follows commands well Psychiatric: Unable to assess as he is not answering questions  Labs on Admission: I have personally reviewed following labs and imaging studies  CBC: Recent Labs  Lab 12/31/19 2015  WBC 10.5  HGB 14.6  HCT 42.5  MCV 89.1  PLT 124   Basic Metabolic Panel: Recent Labs  Lab 12/31/19  2015  NA 139  K 3.7  CL 108  CO2 19*  GLUCOSE 169*  BUN 23  CREATININE 1.48*  CALCIUM 9.6   Liver Function Tests: Recent Labs  Lab 12/31/19 2015  AST 22  ALT 19  ALKPHOS 98  BILITOT 1.0  PROT 7.9  ALBUMIN 3.7   Coagulation Profile: Recent Labs  Lab 12/31/19 2048  INR 1.1   BNP (last 3 results) No results for input(s): PROBNP in the last 8760 hours. CBG: No results for input(s): GLUCAP in the last 168 hours. Thyroid Function Tests: No results for  input(s): TSH, T4TOTAL, FREET4, T3FREE, THYROIDAB in the last 72 hours. Urine analysis:    Component Value Date/Time   COLORURINE YELLOW (A) 12/31/2019 2015   APPEARANCEUR HAZY (A) 12/31/2019 2015   LABSPEC 1.040 (H) 12/31/2019 2015   PHURINE 5.0 12/31/2019 2015   GLUCOSEU 50 (A) 12/31/2019 2015   HGBUR SMALL (A) 12/31/2019 2015   BILIRUBINUR NEGATIVE 12/31/2019 2015   KETONESUR 20 (A) 12/31/2019 2015   PROTEINUR NEGATIVE 12/31/2019 2015   NITRITE NEGATIVE 12/31/2019 2015   LEUKOCYTESUR NEGATIVE 12/31/2019 2015     Radiological Exams on Admission: CT Head Wo Contrast  Result Date: 12/31/2019 CLINICAL DATA:  Seizure nontraumatic. Unresponsive. Rhythmic eye movements. EXAM: CT HEAD WITHOUT CONTRAST TECHNIQUE: Contiguous axial images were obtained from the base of the skull through the vertex without intravenous contrast. COMPARISON:  CT head 12/09/2019. FINDINGS: Brain: Patchy and confluent areas of decreased attenuation are noted throughout the deep and periventricular white matter of the cerebral hemispheres bilaterally, compatible with chronic microvascular ischemic disease. Redemonstration of a left lacunar infarction. No evidence of large-territorial acute infarction. No parenchymal hemorrhage. No mass lesion. No extra-axial collection. No mass effect or midline shift. No hydrocephalus. Basilar cisterns are patent. Vascular: No hyperdense vessel. Skull: No acute fracture or focal lesion. Sinuses/Orbits: Paranasal sinuses and mastoid air cells are clear. The orbits are unremarkable. Other: None. IMPRESSION: No acute intracranial abnormality. Electronically Signed   By: Iven Finn M.D.   On: 12/31/2019 21:50   CT ABDOMEN PELVIS W CONTRAST  Result Date: 12/31/2019 CLINICAL DATA:  Abdominal distension. EXAM: CT ABDOMEN AND PELVIS WITH CONTRAST TECHNIQUE: Multidetector CT imaging of the abdomen and pelvis was performed using the standard protocol following bolus administration of  intravenous contrast. CONTRAST:  162m OMNIPAQUE IOHEXOL 300 MG/ML  SOLN COMPARISON:  None. FINDINGS: Lower chest: Linear atelectasis versus scarring within the right lower lobe. Otherwise no acute abnormality. Hepatobiliary: There is a 2.6 cm fluid density lesion within the right hepatic lobe that likely represents a simple hepatic cyst (2:12). There is a subjacent subcentimeter hypodensity that is too small to characterize. Pancreas: No focal lesion. Normal pancreatic contour. No surrounding inflammatory changes. No main pancreatic ductal dilatation. Spleen: Normal in size without focal abnormality. Adrenals/Urinary Tract: No adrenal nodule bilaterally. Bilateral kidneys enhance symmetrically. No hydronephrosis. No hydroureter. The urinary bladder is unremarkable. Stomach/Bowel: Stomach is within normal limits. Several loops of distal small bowel demonstrates circumferential bowel wall thickening as well as submucosal hyperenhancement. No small bowel dilatation. No pneumatosis. No evidence of large bowel wall thickening or dilatation. Appendix appears normal. Vascular/Lymphatic: No abdominal aorta or iliac aneurysm. No abdominal, pelvic, or inguinal lymphadenopathy. Reproductive: The prostate gland is enlarged measuring up to 5.3 cm. Other: No intraperitoneal free fluid. No intraperitoneal free gas. No organized fluid collection. Musculoskeletal: No abdominal wall hernia or abnormality No suspicious lytic or blastic osseous lesions. Old healed bilateral anterior rib fractures. No acute  displaced fracture. Multilevel degenerative changes of the spine with intervertebral disc space vacuum phenomenon endplate sclerosis at the L5-S1 level. IMPRESSION: 1. Findings suggestive of enteritis. 2. Prostatomegaly.  Consider correlating with PSA levels. Electronically Signed   By: Iven Finn M.D.   On: 12/31/2019 22:01   DG Chest Port 1 View  Result Date: 12/31/2019 CLINICAL DATA:  Unresponsive EXAM: PORTABLE CHEST 1  VIEW COMPARISON:  December 09, 2019 FINDINGS: The heart size and mediastinal contours are within normal limits. Again noted is slight elevation of the right hemidiaphragm. Subsegmental atelectasis seen at the right lung base. The visualized skeletal structures are unremarkable. IMPRESSION: Subsegmental atelectasis at the right lung base. Electronically Signed   By: Prudencio Pair M.D.   On: 12/31/2019 21:06    EKG: Independently reviewed.  Sinus tachycardia  Assessment/Plan  Principal Problem Abdominal pain, nausea and vomiting in the setting of acute enteritis, sepsis, present on admission -CT scan of the abdomen and pelvis showed enteritis.  He was given cefepime along with metronidazole in the ED, continue.  Received IV fluids -Sister reports constipation, if he does have diarrhea sent for C. difficile as well as GI pathogen panel.  Active Problems Encephalopathy -acute septic encephalopathy, POA. At baseline, patient is able to carry a conversation, has difficulties getting the words out but 80% of the time he does.  Currently he is unable to talk to me.  Recent CVA -Keep n.p.o. for now given worsening mental status.  SLP consult in the morning -Resume aspirin, it appears that he is not taking the Plavix anymore -I attempted to pull records from [facility] where he was hospitalized however no matching patients were found  AKI -Unclear baseline creatinine, repeat renal function in the morning -Due to prostatomegaly on the CT scan will obtain serial bladder scan every 8 hours for the next 24 hours to rule out urinary retention  Essential hypertension -Hold lisinopril given elevation in creatinine, continue amlodipine  Hyperlipidemia -Continue statin  Concern for seizure-like activity -Due to concern for seizure-like activity and recent CVA, will obtain an MRI of the brain.  If MRI significantly abnormal or has recurrent seizure, consult neurology in the morning   DVT  prophylaxis: Lovenox  Code Status: Full code  Family Communication: sister at bedside  Disposition Plan: SNF when ready  Bed Type: progressive  Consults called: none   Obs/Inp: inpatient   At the time of admission, it appears that the appropriate admission status for this patient is INPATIENT as it is expected that patient will require hospital care > 2 midnights. This is judged to be reasonable and necessary in order to provide the required intensity of service to ensure the patient's safety given: presenting symptoms, initial radiographic and laboratory data and in the context of their chronic comorbidities. Together, these circumstances are felt to place patient at high at high risk for further clinical deterioration threatening life, limb, or organ.  Marzetta Board, MD, PhD [facility]  Contact via [email or web address]  12/31/2019, 11:00 PM

## 2019-12-31 NOTE — ED Notes (Signed)
MRI on the phone to screen patient via sister.

## 2019-12-31 NOTE — ED Triage Notes (Signed)
Pt in with co abd pain for a few days and no BM since yesterday. Pt has vomiting, staff states vomit was dark.

## 2019-12-31 NOTE — Progress Notes (Signed)
Code Sepsis initiated @ 2239 PM, ELINK following.

## 2019-12-31 NOTE — ED Notes (Signed)
Assisted pt into bed with 2 others to assist. Pt noted to be unresponsive to staff, rhythmic eye movements noted. Possible seizure. Sister at bedside states does not have a hx of seizures. Lasted approx 1 min. Answered to name and touch. Provider at bedside.

## 2019-12-31 NOTE — ED Notes (Signed)
Patient transported to CT 

## 2019-12-31 NOTE — ED Notes (Signed)
Date and time results received: 12/31/19 2200 (use smartphrase ".now" to insert current time)  Test: Lactic Acid Critical Value: 4.4  Name of Provider Notified: Katrinka Blazing, MD  Orders Received? Or Actions Taken?: Actions Taken: Provider aware

## 2020-01-01 ENCOUNTER — Inpatient Hospital Stay: Payer: Medicare Other

## 2020-01-01 DIAGNOSIS — E872 Acidosis, unspecified: Secondary | ICD-10-CM | POA: Insufficient documentation

## 2020-01-01 DIAGNOSIS — E860 Dehydration: Secondary | ICD-10-CM | POA: Insufficient documentation

## 2020-01-01 DIAGNOSIS — K529 Noninfective gastroenteritis and colitis, unspecified: Secondary | ICD-10-CM | POA: Insufficient documentation

## 2020-01-01 DIAGNOSIS — R569 Unspecified convulsions: Secondary | ICD-10-CM

## 2020-01-01 DIAGNOSIS — R4182 Altered mental status, unspecified: Secondary | ICD-10-CM | POA: Insufficient documentation

## 2020-01-01 LAB — GASTROINTESTINAL PANEL BY PCR, STOOL (REPLACES STOOL CULTURE)

## 2020-01-01 LAB — C DIFFICILE QUICK SCREEN W PCR REFLEX
C Diff antigen: NEGATIVE
C Diff interpretation: NOT DETECTED
C Diff toxin: NEGATIVE

## 2020-01-01 LAB — CBC
HCT: 36.6 % — ABNORMAL LOW (ref 39.0–52.0)
Hemoglobin: 12.6 g/dL — ABNORMAL LOW (ref 13.0–17.0)
MCH: 30.4 pg (ref 26.0–34.0)
MCHC: 34.4 g/dL (ref 30.0–36.0)
MCV: 88.4 fL (ref 80.0–100.0)
Platelets: 254 10*3/uL (ref 150–400)
RBC: 4.14 MIL/uL — ABNORMAL LOW (ref 4.22–5.81)
RDW: 12.8 % (ref 11.5–15.5)
WBC: 7.9 10*3/uL (ref 4.0–10.5)
nRBC: 0 % (ref 0.0–0.2)

## 2020-01-01 LAB — LACTIC ACID, PLASMA
Lactic Acid, Venous: 2.6 mmol/L (ref 0.5–1.9)
Lactic Acid, Venous: 4.5 mmol/L (ref 0.5–1.9)
Lactic Acid, Venous: 6 mmol/L (ref 0.5–1.9)

## 2020-01-01 LAB — COMPREHENSIVE METABOLIC PANEL
ALT: 16 U/L (ref 0–44)
AST: 21 U/L (ref 15–41)
Albumin: 3.1 g/dL — ABNORMAL LOW (ref 3.5–5.0)
Alkaline Phosphatase: 82 U/L (ref 38–126)
Anion gap: 9 (ref 5–15)
BUN: 21 mg/dL (ref 8–23)
CO2: 18 mmol/L — ABNORMAL LOW (ref 22–32)
Calcium: 8.9 mg/dL (ref 8.9–10.3)
Chloride: 110 mmol/L (ref 98–111)
Creatinine, Ser: 1.31 mg/dL — ABNORMAL HIGH (ref 0.61–1.24)
GFR, Estimated: 59 mL/min — ABNORMAL LOW (ref >60)
Glucose, Bld: 126 mg/dL — ABNORMAL HIGH (ref 70–99)
Potassium: 3.6 mmol/L (ref 3.5–5.1)
Sodium: 137 mmol/L (ref 135–145)
Total Bilirubin: 1 mg/dL (ref 0.3–1.2)
Total Protein: 6.5 g/dL (ref 6.5–8.1)

## 2020-01-01 LAB — HIV ANTIBODY (ROUTINE TESTING W REFLEX): HIV Screen 4th Generation wRfx: NONREACTIVE

## 2020-01-01 MED ORDER — AMLODIPINE BESYLATE 5 MG PO TABS
5.0000 mg | ORAL_TABLET | Freq: Every day | ORAL | Status: DC
  Administered 2020-01-01 – 2020-01-02 (×2): 5 mg via ORAL
  Filled 2020-01-01 (×2): qty 1

## 2020-01-01 MED ORDER — ASPIRIN 81 MG PO CHEW
81.0000 mg | CHEWABLE_TABLET | Freq: Every day | ORAL | Status: DC
  Administered 2020-01-01 – 2020-01-09 (×9): 81 mg via ORAL
  Filled 2020-01-01 (×9): qty 1

## 2020-01-01 MED ORDER — TAMSULOSIN HCL 0.4 MG PO CAPS
0.4000 mg | ORAL_CAPSULE | Freq: Every day | ORAL | Status: DC
  Administered 2020-01-02 – 2020-01-08 (×5): 0.4 mg via ORAL
  Filled 2020-01-01 (×5): qty 1

## 2020-01-01 MED ORDER — LORAZEPAM 2 MG/ML IJ SOLN: 1.0000 mg | Freq: Once | INTRAMUSCULAR | Status: AC

## 2020-01-01 MED ORDER — CROMOLYN SODIUM 4 % OP SOLN
2.0000 [drp] | Freq: Four times a day (QID) | OPHTHALMIC | Status: DC
  Administered 2020-01-01 – 2020-01-09 (×31): 2 [drp] via OPHTHALMIC
  Filled 2020-01-01: qty 10

## 2020-01-01 MED ORDER — SODIUM CHLORIDE 0.9 % IV SOLN
500.0000 mg | INTRAVENOUS | Status: AC
  Administered 2020-01-01 – 2020-01-03 (×3): 500 mg via INTRAVENOUS
  Filled 2020-01-01 (×4): qty 500

## 2020-01-01 MED ORDER — ATORVASTATIN CALCIUM 80 MG PO TABS
80.0000 mg | ORAL_TABLET | Freq: Every day | ORAL | Status: DC
  Administered 2020-01-02 – 2020-01-08 (×5): 80 mg via ORAL
  Filled 2020-01-01 (×5): qty 1

## 2020-01-01 MED ORDER — LORATADINE 10 MG PO TABS
10.0000 mg | ORAL_TABLET | Freq: Every day | ORAL | Status: DC
  Administered 2020-01-01 – 2020-01-09 (×9): 10 mg via ORAL
  Filled 2020-01-01 (×9): qty 1

## 2020-01-01 MED ORDER — LEVETIRACETAM 500 MG PO TABS
500.0000 mg | ORAL_TABLET | Freq: Two times a day (BID) | ORAL | Status: DC
  Filled 2020-01-01 (×2): qty 1

## 2020-01-01 MED ORDER — SODIUM CHLORIDE 0.9 % IV SOLN
2.0000 g | Freq: Two times a day (BID) | INTRAVENOUS | Status: DC
  Administered 2020-01-01: 2 g via INTRAVENOUS
  Filled 2020-01-01: qty 2

## 2020-01-01 MED ORDER — LEVETIRACETAM IN NACL 1000 MG/100ML IV SOLN
1000.0000 mg | Freq: Two times a day (BID) | INTRAVENOUS | Status: DC
  Administered 2020-01-01: 1000 mg via INTRAVENOUS
  Filled 2020-01-01: qty 100

## 2020-01-01 MED ORDER — LACTATED RINGERS IV BOLUS
500.0000 mL | Freq: Once | INTRAVENOUS | Status: AC
  Administered 2020-01-01: 500 mL via INTRAVENOUS

## 2020-01-01 MED ORDER — FLUTICASONE PROPIONATE 50 MCG/ACT NA SUSP
2.0000 | Freq: Every day | NASAL | Status: DC
  Administered 2020-01-01 – 2020-01-09 (×9): 2 via NASAL
  Filled 2020-01-01: qty 16

## 2020-01-01 MED ORDER — LORAZEPAM 2 MG/ML IJ SOLN
INTRAMUSCULAR | Status: AC
  Administered 2020-01-01: 1 mg via INTRAVENOUS
  Filled 2020-01-01: qty 1

## 2020-01-01 MED ORDER — LEVETIRACETAM IN NACL 500 MG/100ML IV SOLN
500.0000 mg | Freq: Two times a day (BID) | INTRAVENOUS | Status: DC
  Administered 2020-01-01 – 2020-01-03 (×4): 500 mg via INTRAVENOUS
  Filled 2020-01-01 (×8): qty 100

## 2020-01-01 NOTE — ED Notes (Signed)
Pt had large runny BM. Pt cleaned up and clean brief and chux placed. Pt pulled up in bed and given pillow. Pt repositioned and given blanket.

## 2020-01-01 NOTE — ED Notes (Signed)
Pt cleaned after large BM by this RN and Sam Charity fundraiser. Clean briefs and chux placed at this time.

## 2020-01-01 NOTE — ED Notes (Signed)
Pt cleaned and changed into clean brief and chux after small BM and saturating brief with urine. Pt pulled up in bed and is resting at this time.

## 2020-01-01 NOTE — ED Notes (Signed)
Pt had large runny BM. Pt cleaned up with EDT Melanie. Brief changed and clean chux placed. Pt pulled up in bed.

## 2020-01-01 NOTE — Progress Notes (Signed)
Elink Code Sepsis Completion Note:  Order placed by MD to not delay ABX if unable to obtain [facility], but there was not an order for blood cultures. Secure chatted ED RN and MD about the missing order for [facility], but no order was entered. Received IVF replacement adequate for body weight. LA went from 4.4 to 6.0 and back down to 4.5. Being admitted to Progressive Cardiac unit for further monitoring.   [place] Silveria DNP eLink RN

## 2020-01-01 NOTE — ED Notes (Signed)
Admit MD at bedside

## 2020-01-01 NOTE — Progress Notes (Addendum)
PROGRESS NOTE    Alexander Byrd  DXI:[id number] DOB: 1950-01-27 DOA: 12/31/2019 PCP: Jennette Bill., MD   Brief Narrative: Taken from H&P. Alexander Byrd is a 69 y.o. 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  Initially met severe sepsis criteria with tachypnea, tachycardia, lactic acidosis with lactic acid peaked at 6, and encephalopathy received IV fluid and broad-spectrum antibiotics.  GI pathogen with pathogenic E. Coli, blood and urine cultures pending. Apparently blood cultures were drawn after starting antibiotics. CT head without any acute changes but MRI brain with some questionable increased diffusion abnormality involving pons, left middle cerebellar peduncle and left cerebellar hemisphere, concern for seizures/encephalitis.  There is 1 nursing concern when patient with persistent right gaze and rapid nystagmus concerning for seizure and he was started on Keppra.  Neurology was consulted. CT abdomen with concern of enteritis. Speech evaluated him and recommending dysphagia 2 diet.  Subjective: Patient was resting comfortably when seen this morning.  Sister at bedside, stating that he was very irritable before and just slept so should not be waking him up.  Per sister he was little more alert this morning but still not at his baseline.  Assessment & Plan:   Active Problems:   Sepsis ([place])  Severe sepsis with acute enteritis.  POA. GI pathogen with pathogenic E. coli which does not normally require antibiotics.  He was started on broad-spectrum antibiotics as met sepsis criteria with markedly elevated lactic acid. Lactic acid improving, last check this morning and it was 2.3. Blood cultures were drawn after starting antibiotics so might not be very  helpful. UA is not very impressive for infection and urine cultures pending. -Discontinue cefepime and metronidazole and start him on Zithromax 500 mg daily for 3 days which is more recommended for pathogenic E. Coli. Cefepime can also cause seizures-per neurology try to avoid. -Monitor lactic acid. -Continue with gentle hydration for another day. -Continue with current management. -Continue with supportive care  Encephalopathy.POA/concern for seizure-like activity.  Seems improving but patient is still not at his baseline. Nursing concern about seizure-like activity with the right place and rapid nystagmus and MRI brain with some densities which can be due to seizures/encephalitis.  Neurology was consulted-we will appreciate their recommendations.  He was started on Keppra. -Ordered EEG -Continue with Keppra.  Recent CVA.  Had some dysphagia since his prior CVA.  Swallow evaluation was obtained and they are recommending dysphagia 2 diet. -Continue with aspirin and Lipitor.  AKI.  Unknown baseline.  Creatinine is improving with IV fluid. -Continue to monitor renal function. -Avoid nephrotoxins.  Partial urinary retention.  CT abdomen with prostatomegaly and bladder scan with more than 400 cc requiring one time in and out catheter.  There is some concern of urinary retention.  That can also contribute to AKI.  Apparently multiple soiled diapers, so most likely partial retention or incomplete emptying of bladder. -Continue with bladder scan-if continued to retain-he will need Foley catheter placement and urology consult. -Start him on Flomax  Essential hypertension.  Blood pressure mildly elevated. Home dose of lisinopril was held due to AKI. -Continue with amlodipine. -Lisinopril can be restarted if needed.  Hyperlipidemia. -Continue with statin  Objective: Vitals:   01/01/20 1145 01/01/20 1230 01/01/20 1245 01/01/20 1330  BP: Marland Kitchen)  148/91 (!) 141/84 (!) 157/82 135/86  Pulse: (!)  103 93 94 94  Resp: 17 20 (!) 21 (!) 24  Temp:      TempSrc:      SpO2: 96% 92% 98% 98%  Weight:      Height:        Intake/Output Summary (Last 24 hours) at 01/01/2020 1352 Last data filed at 01/01/2020 0149 Gross per 24 hour  Intake 1162.22 ml  Output 400 ml  Net 762.22 ml   Filed Weights   12/31/19 2008 12/31/19 2231  Weight: 74.8 kg 74.8 kg    Examination:  General exam: Appears calm and comfortable  Respiratory system: Clear to auscultation. Respiratory effort normal. Cardiovascular system: S1 & S2 heard, RRR. Gastrointestinal system: Soft, nontender, nondistended, bowel sounds positive. Central nervous system: Sleeping and unable to participate with exam at sister's request. Extremities: No edema, no cyanosis, pulses intact and symmetrical. Psychiatry: Unable to judge as patient was sleeping and sister does not want me to wake him up.   DVT prophylaxis: Lovenox Code Status: Full Family Communication: Discussed with sister at bedside Disposition Plan:  Status is: Inpatient  Remains inpatient appropriate because:Inpatient level of care appropriate due to severity of illness   Dispo: The patient is from: SNF              Anticipated d/c is to: To be determined              Anticipated d/c date is: 2 days              Patient currently is not medically stable to d/c.  Consultants:   Neurology  Procedures:  Antimicrobials:  Cefepime Metronidazole  Data Reviewed: I have personally reviewed following labs and imaging studies  CBC: Recent Labs  Lab 12/31/19 2015 01/01/20 0609  WBC 10.5 7.9  HGB 14.6 12.6*  HCT 42.5 36.6*  MCV 89.1 88.4  PLT 382 803   Basic Metabolic Panel: Recent Labs  Lab 12/31/19 2015 01/01/20 0609  NA 139 137  K 3.7 3.6  CL 108 110  CO2 19* 18*  GLUCOSE 169* 126*  BUN 23 21  CREATININE 1.48* 1.31*  CALCIUM 9.6 8.9   GFR: Estimated Creatinine Clearance: 55.5 mL/min (A) (by C-G formula based on SCr of 1.31 mg/dL  (H)). Liver Function Tests: Recent Labs  Lab 12/31/19 2015 01/01/20 0609  AST 22 21  ALT 19 16  ALKPHOS 98 82  BILITOT 1.0 1.0  PROT 7.9 6.5  ALBUMIN 3.7 3.1*   Recent Labs  Lab 12/31/19 2015  LIPASE 33   Recent Labs  Lab 12/31/19 2059  AMMONIA 13   Coagulation Profile: Recent Labs  Lab 12/31/19 2048  INR 1.1   Cardiac Enzymes: No results for input(s): CKTOTAL, CKMB, CKMBINDEX, TROPONINI in the last 168 hours. BNP (last 3 results) No results for input(s): PROBNP in the last 8760 hours. HbA1C: No results for input(s): HGBA1C in the last 72 hours. CBG: No results for input(s): GLUCAP in the last 168 hours. Lipid Profile: No results for input(s): CHOL, HDL, LDLCALC, TRIG, CHOLHDL, LDLDIRECT in the last 72 hours. Thyroid Function Tests: No results for input(s): TSH, T4TOTAL, FREET4, T3FREE, THYROIDAB in the last 72 hours. Anemia Panel: No results for input(s): VITAMINB12, FOLATE, FERRITIN, TIBC, IRON, RETICCTPCT in the last 72 hours. Sepsis Labs: Recent Labs  Lab 12/31/19 2048 12/31/19 2353 01/01/20 0317 01/01/20 0609  LATICACIDVEN 4.4* 6.0* 4.5* 2.6*    Recent Results (from the past  240 hour(s))  Resp Panel by RT-PCR (Flu A&B, Covid) Nasopharyngeal Swab     Status: None   Collection Time: 12/31/19  8:48 PM   Specimen: Nasopharyngeal Swab; Nasopharyngeal(NP) swabs in vial transport medium  Result Value Ref Range Status   SARS Coronavirus 2 by RT PCR NEGATIVE NEGATIVE Final    Comment: (NOTE) SARS-CoV-2 target nucleic acids are NOT DETECTED.  The SARS-CoV-2 RNA is generally detectable in upper respiratory specimens during the acute phase of infection. The lowest concentration of SARS-CoV-2 viral copies this assay can detect is 138 copies/mL. A negative result does not preclude SARS-Cov-2 infection and should not be used as the sole basis for treatment or other patient management decisions. A negative result may occur with  improper specimen  collection/handling, submission of specimen other than nasopharyngeal swab, presence of viral mutation(s) within the areas targeted by this assay, and inadequate number of viral copies(<138 copies/mL). A negative result must be combined with clinical observations, patient history, and epidemiological information. The expected result is Negative.  Fact Sheet for Patients:  EntrepreneurPulse.com.au  Fact Sheet for Healthcare Providers:  IncredibleEmployment.be  This test is no t yet approved or cleared by the [place] FDA and  has been authorized for detection and/or diagnosis of SARS-CoV-2 by FDA under an Emergency Use Authorization (EUA). This EUA will remain  in effect (meaning this test can be used) for the duration of the COVID-19 declaration under Section 564(b)(1) of the Act, 21 U.S.C.section 360bbb-3(b)(1), unless the authorization is terminated  or revoked sooner.       Influenza A by PCR NEGATIVE NEGATIVE Final   Influenza B by PCR NEGATIVE NEGATIVE Final    Comment: (NOTE) The Xpert Xpress SARS-CoV-2/FLU/RSV plus assay is intended as an aid in the diagnosis of influenza from Nasopharyngeal swab specimens and should not be used as a sole basis for treatment. Nasal washings and aspirates are unacceptable for Xpert Xpress SARS-CoV-2/FLU/RSV testing.  Fact Sheet for Patients: EntrepreneurPulse.com.au  Fact Sheet for Healthcare Providers: IncredibleEmployment.be  This test is not yet approved or cleared by the [place] FDA and has been authorized for detection and/or diagnosis of SARS-CoV-2 by FDA under an Emergency Use Authorization (EUA). This EUA will remain in effect (meaning this test can be used) for the duration of the COVID-19 declaration under Section 564(b)(1) of the Act, 21 U.S.C. section 360bbb-3(b)(1), unless the authorization is terminated or revoked.  Performed at [facility] [place], [place] [address]   C Difficile Quick Screen w PCR reflex     Status: None   Collection Time: 01/01/20  6:09 AM   Specimen: STOOL  Result Value Ref Range Status   C Diff antigen NEGATIVE NEGATIVE Final   C Diff toxin NEGATIVE NEGATIVE Final   C Diff interpretation No C. difficile detected.  Final    Comment: Performed at [facility], [address] [place], [place] [address]  Gastrointestinal Panel by PCR , Stool     Status: Abnormal   Collection Time: 01/01/20  6:09 AM   Specimen: STOOL  Result Value Ref Range Status   Campylobacter species NOT DETECTED NOT DETECTED Final   Plesimonas shigelloides NOT DETECTED NOT DETECTED Final   Salmonella species NOT DETECTED NOT DETECTED Final   Yersinia enterocolitica NOT DETECTED NOT DETECTED Final   Vibrio species NOT DETECTED NOT DETECTED Final   Vibrio cholerae NOT DETECTED NOT DETECTED Final   Enteroaggregative E coli (EAEC) NOT DETECTED NOT DETECTED Final  Enteropathogenic E coli (EPEC) DETECTED (A) NOT DETECTED Final    Comment: RESULT CALLED TO, READ BACK BY AND VERIFIED WITH: ISABELLA LAPIETRA ON 01/01/20 AT 0751 QSD    Enterotoxigenic E coli (ETEC) NOT DETECTED NOT DETECTED Final   Shiga like toxin producing E coli (STEC) NOT DETECTED NOT DETECTED Final   Shigella/Enteroinvasive E coli (EIEC) NOT DETECTED NOT DETECTED Final   Cryptosporidium NOT DETECTED NOT DETECTED Final   Cyclospora cayetanensis NOT DETECTED NOT DETECTED Final   Entamoeba histolytica NOT DETECTED NOT DETECTED Final   Giardia lamblia NOT DETECTED NOT DETECTED Final   Adenovirus F40/41 NOT DETECTED NOT DETECTED Final   Astrovirus NOT DETECTED NOT DETECTED Final   Norovirus GI/GII NOT DETECTED NOT DETECTED Final   Rotavirus A NOT DETECTED NOT DETECTED Final   Sapovirus (I, II, IV, and V) NOT DETECTED NOT DETECTED Final    Comment: Performed at [facility], [address], [place], [place] [address]      Radiology Studies: CT Head Wo Contrast  Result Date: 12/31/2019 CLINICAL DATA:  Seizure nontraumatic. Unresponsive. Rhythmic eye movements. EXAM: CT HEAD WITHOUT CONTRAST TECHNIQUE: Contiguous axial images were obtained from the base of the skull through the vertex without intravenous contrast. COMPARISON:  CT head 12/09/2019. FINDINGS: Brain: Patchy and confluent areas of decreased attenuation are noted throughout the deep and periventricular white matter of the cerebral hemispheres bilaterally, compatible with chronic microvascular ischemic disease. Redemonstration of a left lacunar infarction. No evidence of large-territorial acute infarction. No parenchymal hemorrhage. No mass lesion. No extra-axial collection. No mass effect or midline shift. No hydrocephalus. Basilar cisterns are patent. Vascular: No hyperdense vessel. Skull: No acute fracture or focal lesion. Sinuses/Orbits: Paranasal sinuses and mastoid air cells are clear. The orbits are unremarkable. Other: None. IMPRESSION: No acute intracranial abnormality. Electronically Signed   By: Iven Finn M.D.   On: 12/31/2019 21:50   MR BRAIN WO CONTRAST  Result Date: 01/01/2020 CLINICAL DATA:  Initial evaluation for acute seizure. EXAM: MRI HEAD WITHOUT CONTRAST TECHNIQUE: Multiplanar, multiecho pulse sequences of the brain and surrounding structures were obtained without intravenous contrast. COMPARISON:  Prior head CT from 12/31/2019. FINDINGS: Brain: Examination moderately degraded by motion artifact. Diffuse prominence of the CSF containing spaces compatible generalized age-related cerebral atrophy. Six patchy and confluent T2/FLAIR hyperintensity within the periventricular deep white matter both cerebral hemispheres most consistent with chronic small vessel ischemic disease, moderate in nature. Few scatter remote lacunar infarcts present about the left basal ganglia/corona radiata. There is subtly increased diffusion signal abnormality  involving the pons, with extension into the left middle cerebellar peduncle (series 5, image 13). Additionally, there is subtly increased diffuse diffusion abnormality within the adjacent left cerebellar hemisphere (series 5, images 11, 9). Subtly increased associated FLAIR signal abnormality seen as well (series 13, images 8, 7, 6). No significant regional mass effect or edema. Adjacent fourth ventricle remains widely patent as do the basilar cisterns. No associated susceptibility artifact to suggest hemorrhage. Findings are nonspecific. No other abnormal foci of restricted diffusion to suggest acute or subacute ischemia. Gray-white matter differentiation otherwise maintained. No foci of susceptibility artifact to suggest acute or chronic intracranial hemorrhage. No mass lesion, mass effect, or midline shift. Ventricles normal size without hydrocephalus. No extra-axial fluid collection. Pituitary gland and suprasellar region within normal limits. Midline structures grossly intact. Vascular: Major intracranial vascular flow voids are grossly maintained at the skull base. Diminutive vertebrobasilar system noted, suggesting fetal type origin of the PCAs. Skull and upper cervical  spine: Craniocervical junction grossly within normal limits. No focal marrow replacing lesion. No scalp soft tissue abnormality. Sinuses/Orbits: Globes and orbital soft tissues within normal limits. Paranasal sinuses are clear. No mastoid effusion. Inner ear structures grossly normal. Other: None. IMPRESSION: 1. Technically limited exam due to extensive motion artifact. 2. Subtly increased diffusion abnormality involving the pons, left middle cerebellar peduncle, and adjacent left cerebellar hemisphere as above. Findings are nonspecific, but could reflect changes related to acute seizure given provided history. Correlation with EEG suggested. Differential considerations include changes related to acute/subacute ischemia versus a nonspecific  cerebritis/encephalitis. Correlation with LP and CSF values also suggested as clinically warranted. Changes of PRES are considered, although felt to be less likely given the relative lack of additional changes elsewhere within the brain. 3. No other acute intracranial abnormality. 4. Age-related cerebral atrophy with moderate chronic small vessel ischemic disease, with a few scattered remote lacunar infarcts about the left basal ganglia/corona radiata. Electronically Signed   By: Jeannine Boga M.D.   On: 01/01/2020 01:56   CT ABDOMEN PELVIS W CONTRAST  Result Date: 12/31/2019 CLINICAL DATA:  Abdominal distension. EXAM: CT ABDOMEN AND PELVIS WITH CONTRAST TECHNIQUE: Multidetector CT imaging of the abdomen and pelvis was performed using the standard protocol following bolus administration of intravenous contrast. CONTRAST:  124m OMNIPAQUE IOHEXOL 300 MG/ML  SOLN COMPARISON:  None. FINDINGS: Lower chest: Linear atelectasis versus scarring within the right lower lobe. Otherwise no acute abnormality. Hepatobiliary: There is a 2.6 cm fluid density lesion within the right hepatic lobe that likely represents a simple hepatic cyst (2:12). There is a subjacent subcentimeter hypodensity that is too small to characterize. Pancreas: No focal lesion. Normal pancreatic contour. No surrounding inflammatory changes. No main pancreatic ductal dilatation. Spleen: Normal in size without focal abnormality. Adrenals/Urinary Tract: No adrenal nodule bilaterally. Bilateral kidneys enhance symmetrically. No hydronephrosis. No hydroureter. The urinary bladder is unremarkable. Stomach/Bowel: Stomach is within normal limits. Several loops of distal small bowel demonstrates circumferential bowel wall thickening as well as submucosal hyperenhancement. No small bowel dilatation. No pneumatosis. No evidence of large bowel wall thickening or dilatation. Appendix appears normal. Vascular/Lymphatic: No abdominal aorta or iliac aneurysm.  No abdominal, pelvic, or inguinal lymphadenopathy. Reproductive: The prostate gland is enlarged measuring up to 5.3 cm. Other: No intraperitoneal free fluid. No intraperitoneal free gas. No organized fluid collection. Musculoskeletal: No abdominal wall hernia or abnormality No suspicious lytic or blastic osseous lesions. Old healed bilateral anterior rib fractures. No acute displaced fracture. Multilevel degenerative changes of the spine with intervertebral disc space vacuum phenomenon endplate sclerosis at the L5-S1 level. IMPRESSION: 1. Findings suggestive of enteritis. 2. Prostatomegaly.  Consider correlating with PSA levels. Electronically Signed   By: MIven FinnM.D.   On: 12/31/2019 22:01   DG Chest Port 1 View  Result Date: 12/31/2019 CLINICAL DATA:  Unresponsive EXAM: PORTABLE CHEST 1 VIEW COMPARISON:  December 09, 2019 FINDINGS: The heart size and mediastinal contours are within normal limits. Again noted is slight elevation of the right hemidiaphragm. Subsegmental atelectasis seen at the right lung base. The visualized skeletal structures are unremarkable. IMPRESSION: Subsegmental atelectasis at the right lung base. Electronically Signed   By: BPrudencio PairM.D.   On: 12/31/2019 21:06    Scheduled Meds: . amLODipine  5 mg Oral Daily  . aspirin  81 mg Oral Daily  . atorvastatin  80 mg Oral q1800  . cromolyn  2 drop Left Eye QID  . enoxaparin (LOVENOX) injection  40 mg Subcutaneous  Q24H  . erythromycin   Left Eye Once  . fluticasone  2 spray Each Nare Daily  . loratadine  10 mg Oral Daily  . metroNIDAZOLE  500 mg Oral Q8H   Continuous Infusions: . sodium chloride 125 mL/hr at 01/01/20 1159  . ceFEPime (MAXIPIME) IV Stopped (01/01/20 0909)  . levETIRAcetam Stopped (01/01/20 0832)     LOS: 1 day   Time spent: 45 minutes.  Lorella Nimrod, MD [facility]  If 7PM-7AM, please contact night-coverage [email or web address]  01/01/2020, 1:52 PM   This record has been created using  Systems analyst. Errors have been sought and corrected,but may not always be located. Such creation errors do not reflect on the standard of care.

## 2020-01-01 NOTE — ED Notes (Signed)
Pt cleaned due to a small amount of stool on depends. No urine noted. Not enough stool noted on depends for sample to be sent.

## 2020-01-01 NOTE — ED Notes (Signed)
Oncoming RN Lupita Leash informed to call sister of pt with room updates, and that pt sister should be returning some time this afternoon.

## 2020-01-01 NOTE — ED Notes (Signed)
Pt started to make loud breath sounds while RN was at bedside and lifting left arm as though annoyed about something on his arm. Pt would not answer when staff and family asked him questions and started to clear his throat repeatedly. Pt then clenched his jaw and and both eyes flinched back and forth. MD made aware.

## 2020-01-01 NOTE — ED Notes (Signed)
Pt had large runny BM. Pt cleaned up with RN Jaclynn Guarneri and placed in clean brief and chux. Pt pulled up in bed and resting at this time.

## 2020-01-01 NOTE — Progress Notes (Signed)
Pharmacy Antibiotic Note  Alexander Byrd is a 69 y.o. male admitted on 12/31/2019 with enteritis.  Pharmacy has been consulted for Cefepime dosing.  Plan: Cefepime 2gm IV q12hrs  Height: 6' (182.9 cm) Weight: 74.8 kg (164 lb 14.5 oz) IBW/kg (Calculated) : 77.6  Temp (24hrs), Avg:98.9 F (37.2 C), Min:98.9 F (37.2 C), Max:98.9 F (37.2 C)  Recent Labs  Lab 12/31/19 2015 12/31/19 2048 12/31/19 2353  WBC 10.5  --   --   CREATININE 1.48*  --   --   LATICACIDVEN  --  4.4* 6.0*    Estimated Creatinine Clearance: 49.1 mL/min (A) (by C-G formula based on SCr of 1.48 mg/dL (H)).    No Known Allergies  Antimicrobials this admission:   >>    >>   Dose adjustments this admission:   Microbiology results:  BCx:   UCx:    Sputum:    MRSA PCR:   Thank you for allowing pharmacy to be a part of this patient's care.  Valrie Hart A 01/01/2020 3:33 AM

## 2020-01-01 NOTE — ED Notes (Addendum)
Pt had large runny BM. Pt cleaned up with EDT Melanie and clean brief and chux applied. Pt pulled up in bed and resting at this time.

## 2020-01-01 NOTE — ED Notes (Signed)
NS Infusion rate changed to 53mL/hr per admitting MD order at this time

## 2020-01-01 NOTE — ED Notes (Signed)
Pt has now had three bowel movements and is constantly urinating. Condom cath in place and another linens change.

## 2020-01-01 NOTE — Progress Notes (Signed)
   01/01/20 1839  Vitals  Temp 98.2 F (36.8 C)  Temp Source Oral  BP (!) 136/92  MAP (mmHg) 104  BP Location Right Arm  BP Method Automatic  Patient Position (if appropriate) Lying  Pulse Rate 95  Pulse Rate Source Monitor   Pt arrived to unit. Pt only stated his name. Any other questions the patient did not answer. Call bell within reach. Bed alarm on. Bed wheels locked.

## 2020-01-01 NOTE — Evaluation (Signed)
Clinical/Bedside Swallow Evaluation Patient Details  Name: Alexander Byrd MRN: [id number] Date of Birth: 1950-05-26  Today's Date: 01/01/2020 Time: SLP Start Time (ACUTE ONLY): 1025 SLP Stop Time (ACUTE ONLY): 1125 SLP Time Calculation (min) (ACUTE ONLY): 60 min  Past Medical History: History reviewed. No pertinent past medical history. No [facility] notes.  HPI:  Pt is a 69 y.o. 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 More confusion per family report as well. Patient is altered on arrival and unable to write any verbal history.  He is accompanied by his sister who states that typically his able to speak and only has weakness in his left side.  Unsure of pt's Baseline Cognitive-communication status or functional status for safe po intake, self-feeding as there were no notes in the chart in Care Everywhere from his recent CVA at "[facility]" ~3 weeks ago.    Assessment / Plan / Recommendation Clinical Impression  Pt appears to present w/ oropharyngeal phase dysphagia w/ oropharyngeal phase deficits and suspected Neuromuscular impact; also noted declined Cognitive status impacting his overall awareness/engagement during po taskswhich increases risk for aspiration. Pt required mod tactile/verbalcues for orientation to bolus presentation, follow through w/ tasks, and self-feeding support. Pt consumed trials of singe ice chips, Nectar liquids tsp/cup, purees, and minced solidsw/ no immediate, overt clinical s/s of aspiration noted; no decline in O2 sats (97%), no cough, and no decline in respiratory presentation during/post trials. Thin liquids trials were Not assessed d/t the degree of cueing required during po trials, and d/t pt's overall decreased awareness and attention to  task. Oral phase was c/b slow, deliberate bolus management and oral clearing of all boluses given. He required increased Time for A-P transfer and swallow -- unsure if related to overall weakness and decreased attention. He appeared to give an more effortful, audible swallow w/ some boluses. OM exam was minimal d/t pt's follow through but no unilateral weakness was noted; missing few Dentition. Recommend dysphagia level 2 diet(minced) w/ Nectar consistency liquids; aspiration precautions; Pills Crushed in puree; feeding support and supervision at meals, reduce Distractions during meals and check ofr oral clearing during/post intake. NSG/MD updated. SLP Visit Diagnosis: Dysphagia, oropharyngeal phase (R13.12)    Aspiration Risk  Risk for inadequate nutrition/hydration;Mild aspiration risk;Moderate aspiration risk    Diet Recommendation  Dysphagia level 2 (minced foods w/ gravies); Nectar consistency liquids via spoon, cup. Aspiration precautions. Feeding support at all meals.  Medication Administration: Crushed with puree (as able for safer swallowing)    Other  Recommendations Recommended Consults:  (Dietician f/u) Oral Care Recommendations: Oral care BID;Oral care before and after PO;Staff/trained caregiver to provide oral care Other Recommendations: Order thickener from pharmacy;Prohibited food (jello, ice cream, thin soups);Remove water pitcher;Have oral suction available   Follow up Recommendations Skilled Nursing facility      Frequency and Duration min 3x week  2 weeks       Prognosis Prognosis for Safe Diet Advancement: Fair Barriers to Reach Goals: Cognitive deficits;Time post onset;Severity of deficits;Language deficits;Behavior      Swallow Study   General Date of Onset: 12/31/19 HPI: Pt is a 69 y.o. 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 More confusion per family report as well. Patient is altered on arrival and unable to write any verbal history.  He is accompanied by his sister who states that typically his able to speak and only has weakness in his left side.  Unsure of pt's Baseline Cognitive-communication status or functional status for safe po intake, self-feeding as there were no notes in the chart in Care Everywhere from his recent CVA at "[facility]" ~3 weeks ago.  Type of Study: Bedside Swallow Evaluation Previous Swallow Assessment: unknown Diet Prior to this Study: NPO Temperature Spikes Noted: No (wbc 7.9) Respiratory Status: Room air History of Recent Intubation: No Behavior/Cognition: Cooperative;Pleasant mood;Confused;Distractible;Requires cueing (drowsy at first but then awakened) Oral Cavity Assessment: Within Functional Limits (grossly - he flinched to swab at gums) Oral Care Completed by SLP: Yes Oral Cavity - Dentition: Missing dentition (several) Vision:  (n/a) Self-Feeding Abilities: Total assist (able to hold cup w/ SLP) Patient Positioning: Upright in bed (needed full positioning) Baseline Vocal Quality: Normal (mumbled speech) Volitional Cough: Cognitively unable to elicit Volitional Swallow: Unable to elicit    Oral/Motor/Sensory Function Overall Oral Motor/Sensory Function: Generalized oral weakness (no unilateral weakness; no gag reflex) Facial ROM: Within Functional Limits Facial Symmetry: Within Functional Limits Lingual ROM: Within Functional Limits Lingual Symmetry: Within Functional Limits Lingual Strength:  (fair)   Ice Chips Ice chips: Within functional limits (grossly) Presentation: Spoon (fed; 5 trials) Other Comments: but min slow to respond overall w/ trials   Thin Liquid Thin Liquid: Not tested    Nectar Thick Nectar Thick Liquid: Impaired Presentation: Cup;Self Fed;Spoon (supported by  SLP; 8 trials via spoon, 3 trials via cup) Oral Phase Impairments: Reduced lingual movement/coordination Oral phase functional implications: Prolonged oral transit Pharyngeal Phase Impairments: Suspected delayed Swallow (audible swallows)   Honey Thick Honey Thick Liquid: Not tested   Puree Puree: Impaired Presentation: Spoon (fed; 8 trials) Oral Phase Impairments: Reduced lingual movement/coordination Oral Phase Functional Implications: Prolonged oral transit Pharyngeal Phase Impairments:  (none)   Solid     Solid: Impaired Presentation: Spoon (fed; 5 trials) Oral Phase Impairments: Impaired mastication;Reduced lingual movement/coordination Oral Phase Functional Implications: Prolonged oral transit Pharyngeal Phase Impairments:  (none)       Jerilynn Som, MS, CCC-SLP Speech Language Pathologist Rehab Services [phone] Dorena Dorfman 01/01/2020,11:50 AM

## 2020-01-01 NOTE — ED Notes (Signed)
Pt able to answer sister's questions and appears to be interacting more appropriately. Responsive to voice at this time. No oral trauma or loss of bowels noted.

## 2020-01-01 NOTE — ED Notes (Signed)
SLP at bedside.

## 2020-01-01 NOTE — Consult Note (Signed)
NEUROLOGY CONSULTATION NOTE   Date of service: January 01, 2020 Patient Name: Alexander Byrd MRN:  [id number] DOB:  1951-01-05 Reason for consult: "MRI with subtle diffusion restriction in left middle cerebellar peduncle and left cerebellar hemisphere" _ _ _   _ __   _ __ _ _  __ __   _ __   __ _  History of Present Illness  Alexander Byrd is a 69 y.o. 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 Found to have EPEC Gastroenteritis with AKI,  Tachycardia, tachypnea, elevated lactate and creatinine. He was noted to have right eye deviation with twitching eye movements concerning for a possible seizure. CTH was negative for an acute infarct. He was loaded with Keppra 1G IV once.  On my evaluation, patient is aslep, wakes up to tell me his name and follows commands and then goes back to bed. Unable to provide and meaningful history but endorses recent hisory of stroke and weak on the left as a result. Denies any hx of seizures. Speaking to the bedside RN, patient was awake, alert on the report that she got from previous RN.   ROS   Unable to obtain due to somnolence.  Past History  History reviewed. No pertinent past medical history. The histories are not reviewed yet. Please review them in the "History" navigator section and refresh this SmartLink. No family history on file. Social History   Socioeconomic History  . Marital status: Single    Spouse name: Not on file  . Number of children: Not on file  . Years of education: Not on file  . Highest education level: Not on file  Occupational History  . Not on file  Tobacco Use  . Smoking status: Not on file  Substance and Sexual Activity  . Alcohol use: Not on file  . Drug use: Not on file  . Sexual activity: Not on file  Other Topics Concern  . Not on file  Social History Narrative  . Not on  file   Social Determinants of Health   Financial Resource Strain:   . Difficulty of Paying Living Expenses: Not on file  Food Insecurity:   . Worried About Programme researcher, broadcasting/film/video in the Last Year: Not on file  . Ran Out of Food in the Last Year: Not on file  Transportation Needs:   . Lack of Transportation (Medical): Not on file  . Lack of Transportation (Non-Medical): Not on file  Physical Activity:   . Days of Exercise per Week: Not on file  . Minutes of Exercise per Session: Not on file  Stress:   . Feeling of Stress : Not on file  Social Connections:   . Frequency of Communication with Friends and Family: Not on file  . Frequency of Social Gatherings with Friends and Family: Not on file  . Attends Religious Services: Not on file  . Active Member of Clubs or Organizations: Not on file  . Attends Banker Meetings: Not on file  . Marital Status: Not on file   No Known Allergies  Medications  (Not in a hospital admission)    Vitals   Vitals:   01/01/20 1330 01/01/20 1400 01/01/20 1430 01/01/20 1445  BP: 135/86 118/85 133/86 127/89  Pulse: 94 (!) 143 97   Resp: (!) 24 20 20    Temp:      TempSrc:  SpO2: 98% 100% 98%   Weight:      Height:         Body mass index is 22.37 kg/m.  Physical Exam   General: Laying comfortably in bed; in no acute distress.  HENT: Normal oropharynx and mucosa. Normal external appearance of ears and nose.  Neck: Supple, no pain or tenderness  CV: No JVD. No peripheral edema.  Pulmonary: Symmetric Chest rise. Normal respiratory effort.  Abdomen: Soft to touch, non-tender.  Ext: No cyanosis, edema, or deformity  Skin: No rash. Normal palpation of skin.   Musculoskeletal: Normal digits and nails by inspection. No clubbing.   Neurologic Examination  Mental status/Cognition: eyes closed, somnolent, opens eye to voice, tracks with face, follows commands. Reports that he is tired and goes back to sleep Speech/language:  Dysarthric speech, Fluent, comprehension intact to simple commands with a lot of encouragement. Cranial nerves:   CN II Pupils equal and reactive to light, no VF deficits   CN III,IV,VI EOM intact, no gaze preference or deviation, no nystagmus   CN V normal sensation in V1, V2, and V3 segments bilaterally   CN VII no asymmetry, no nasolabial fold flattening   CN VIII normal hearing to speech   CN IX & X normal palatal elevation, no uvular deviation   CN XI    CN XII midline tongue protrusion   Motor:  Muscle bulk: poor, tone normal, no tremor. Unable to do detailed strength testing but hand grip is a 5/5 on the Right and about 4+ on the left. He is able to wiggles toes BL on command. Did not lift legs up on command.  Reflexes:  Right Left Comments  Pectoralis      Biceps (C5/6) 2 2   Brachioradialis (C5/6) 2 2    Triceps (C6/7) 2 2    Patellar (L3/4) 3 3    Achilles (S1) 2 2    Hoffman      Plantar     Jaw jerk    Sensation:  Light touch Intact throughout   Pin prick    Temperature    Vibration   Proprioception    Coordination/Complex Motor:  - Unable to assess due to somnolence, no obvious tremor or ataxia.  Labs   CBC:  Recent Labs  Lab 12/31/19 2015 01/01/20 0609  WBC 10.5 7.9  HGB 14.6 12.6*  HCT 42.5 36.6*  MCV 89.1 88.4  PLT 382 254    Basic Metabolic Panel:  Lab Results  Component Value Date   NA 137 01/01/2020   K 3.6 01/01/2020   CO2 18 (L) 01/01/2020   GLUCOSE 126 (H) 01/01/2020   BUN 21 01/01/2020   CREATININE 1.31 (H) 01/01/2020   CALCIUM 8.9 01/01/2020   GFRNONAA 59 (L) 01/01/2020   Lipid Panel: No results found for: LDLCALC HgbA1c: No results found for: HGBA1C Urine Drug Screen: No results found for: LABOPIA, COCAINSCRNUR, LABBENZ, AMPHETMU, THCU, LABBARB  Alcohol Level No results found for: Operating Room Services  MRI Brain  IMPRESSION: 1. Technically limited exam due to extensive motion artifact. 2. Subtly increased diffusion abnormality involving  the pons, left middle cerebellar peduncle, and adjacent left cerebellar hemisphere as above. Findings are nonspecific, but could reflect changes related to acute seizure given provided history. Correlation with EEG suggested. Differential considerations include changes related to acute/subacute ischemia versus a nonspecific cerebritis/encephalitis. Correlation with LP and CSF values also suggested as clinically warranted. Changes of PRES are considered, although felt to be less likely given the  relative lack of additional changes elsewhere within the brain. 3. No other acute intracranial abnormality. 4. Age-related cerebral atrophy with moderate chronic small vessel ischemic disease, with a few scattered remote lacunar infarcts about the left basal ganglia/corona radiata.  rEEG: pending.  Impression   Alexander Byrd is a 69 y.o. 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 He was noted to have right eye deviation with twitching eye movements concerning for a possible seizure. MRI shows subtle diffusion restriction in left cerebral hemisphere and left cerebellum which could potentially fit with the noted event with R gaze deviation.  I do think that his MRI changes are probably due to a seizure. We will get a repeat MRI Brain in 2-3 days to ensure resolution of the noted diffusion restriction which is important to rule out encephalitis/cerebritis.  Recommendations  - I ordered Keppra 500mg  BID - I ordered routine EEG - Recommend repeat MRI Brain with and without contrast in 2-3 days to ensure resolution of the diffusion restriction. - Recommend alternative to Cefepime if poosible due to its potential to cause seizures ______________________________________________________________________   Thank you  for the opportunity to take part in the care of this patient. If you have any further questions, please contact the neurology consultation attending.  Signed,  [facility] Pager Number Erick Blinks _ _ _   _ __   _ __ _ _  __ __   _ __   __ _

## 2020-01-01 NOTE — ED Notes (Signed)
Meal tray left at bedside at this time. Will attempt assisting with feeding when pt is more awake/alert

## 2020-01-01 NOTE — ED Notes (Signed)
Called lab to draw second set of cultures at this time. Per lab tech, will let someone know to come draw at this time.

## 2020-01-02 ENCOUNTER — Inpatient Hospital Stay: Payer: Medicare Other

## 2020-01-02 ENCOUNTER — Encounter: Payer: Self-pay | Admitting: Internal Medicine

## 2020-01-02 DIAGNOSIS — N179 Acute kidney failure, unspecified: Secondary | ICD-10-CM

## 2020-01-02 DIAGNOSIS — R4182 Altered mental status, unspecified: Secondary | ICD-10-CM

## 2020-01-02 DIAGNOSIS — R6520 Severe sepsis without septic shock: Secondary | ICD-10-CM

## 2020-01-02 DIAGNOSIS — A4151 Sepsis due to Escherichia coli [E. coli]: Secondary | ICD-10-CM

## 2020-01-02 LAB — CBC
HCT: 36.3 % — ABNORMAL LOW (ref 39.0–52.0)
Hemoglobin: 12.6 g/dL — ABNORMAL LOW (ref 13.0–17.0)
MCH: 30.9 pg (ref 26.0–34.0)
MCHC: 34.7 g/dL (ref 30.0–36.0)
MCV: 89 fL (ref 80.0–100.0)
Platelets: 226 10*3/uL (ref 150–400)
RBC: 4.08 MIL/uL — ABNORMAL LOW (ref 4.22–5.81)
RDW: 12.9 % (ref 11.5–15.5)
WBC: 4.9 10*3/uL (ref 4.0–10.5)
nRBC: 0 % (ref 0.0–0.2)

## 2020-01-02 LAB — URINE CULTURE: Culture: NO GROWTH

## 2020-01-02 LAB — BASIC METABOLIC PANEL
Anion gap: 7 (ref 5–15)
BUN: 14 mg/dL (ref 8–23)
CO2: 20 mmol/L — ABNORMAL LOW (ref 22–32)
Calcium: 8.8 mg/dL — ABNORMAL LOW (ref 8.9–10.3)
Chloride: 110 mmol/L (ref 98–111)
Creatinine, Ser: 1.19 mg/dL (ref 0.61–1.24)
GFR, Estimated: >60 mL/min (ref >60)
Glucose, Bld: 94 mg/dL (ref 70–99)
Potassium: 3.7 mmol/L (ref 3.5–5.1)
Sodium: 137 mmol/L (ref 135–145)

## 2020-01-02 LAB — LACTIC ACID, PLASMA: Lactic Acid, Venous: 1 mmol/L (ref 0.5–1.9)

## 2020-01-02 MED ORDER — LORAZEPAM 2 MG/ML IJ SOLN
1.0000 mg | INTRAMUSCULAR | Status: DC | PRN
  Administered 2020-01-02: 23:00:00 1 mg via INTRAVENOUS

## 2020-01-02 MED ORDER — LORAZEPAM 2 MG/ML IJ SOLN
INTRAMUSCULAR | Status: AC
  Filled 2020-01-02: qty 1

## 2020-01-02 MED ORDER — GADOBUTROL 1 MMOL/ML IV SOLN
7.0000 mL | Freq: Once | INTRAVENOUS | Status: AC | PRN
  Administered 2020-01-02: 7 mL via INTRAVENOUS

## 2020-01-02 NOTE — Procedures (Signed)
Patient Name: Alexander Byrd  MRN: [id number]  Epilepsy Attending: Charlsie Quest  Referring Physician/Provider: Dr Erick Blinks Date: 01/02/2020 Duration: 25.27 mins  Patient history: 69 y.o. 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 He was noted to have right eye deviation with twitching eye movements concerning for a possible seizure. EEG to evaluate for seizure  Level of alertness: Awake, asleep  AEDs during EEG study: LEV  Technical aspects: This EEG study was done with scalp electrodes positioned according to the 10-20 International system of electrode placement. Electrical activity was acquired at a sampling rate of 500Hz  and reviewed with a high frequency filter of 70Hz  and a low frequency filter of 1Hz . EEG data were recorded continuously and digitally stored.   Description: The posterior dominant rhythm consists of 8 Hz activity of moderate voltage (25-35 uV) seen predominantly in posterior head regions, symmetric and reactive to eye opening and eye closing. Sleep was characterized by vertex waves, sleep spindles (12 to 14 Hz), maximal frontocentral region.  EEG showed intermittent generalized 3 to 6 Hz theta-delta slowing. Hyperventilation and photic stimulation were not performed.     ABNORMALITY -Intermittent slow, generalized  IMPRESSION: This study is suggestive of mild diffuse encephalopathy, nonspecific etiology. No seizures or epileptiform discharges were seen throughout the recording.  Alexander Byrd 

## 2020-01-02 NOTE — Progress Notes (Signed)
PROGRESS NOTE    Alexander Byrd  YIR:[id number] DOB: 1950-04-26 DOA: 12/31/2019 PCP: Jennette Bill., MD   Brief Narrative: Taken from H&P. Alexander Byrd is a 69 y.o. 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  Initially met severe sepsis criteria with tachypnea, tachycardia, lactic acidosis with lactic acid peaked at 6, and encephalopathy received IV fluid and broad-spectrum antibiotics.  GI pathogen with pathogenic E. Coli, blood and urine cultures pending. Apparently blood cultures were drawn after starting antibiotics. CT head without any acute changes but MRI brain with some questionable increased diffusion abnormality involving pons, left middle cerebellar peduncle and left cerebellar hemisphere, concern for seizures/encephalitis.  There is 1 nursing concern when patient with persistent right gaze and rapid nystagmus concerning for seizure and he was started on Keppra.  Neurology was consulted. CT abdomen with concern of enteritis. Speech evaluated him and recommending dysphagia 2 diet.  Subjective: Patient was resting comfortably when seen this morning.  Responds to simple questions. Denies complaints.  Assessment & Plan:   Active Problems:   Sepsis ([place])  Severe sepsis with acute enteritis.  POA. GI pathogen with epec. c diff neg. Lactic acid has normalized. Ct showing enteritis. Urine culture ngtd. Blood cultures also ngtd. Is s/p cefepime/flagyl. Hemodynamically stable - continue azithromycin, 3rd and last dose will be 12/9 -Continue with gentle hydration for another day. -Continue with current management. -Continue with supportive care  Encephalopathy.POA/concern for seizure-like activity.  Seems improving but patient is still not at his  baseline. Nursing concern about seizure-like activity with the right place and rapid nystagmus and MRI brain with some densities which can be due to seizures/encephalitis.  Neurology was consulted, we appreciate their recommendations. EEG w/o active seizure activity. He was started on Keppra. - continue keppra - repeat MRI ordered for the morning  Recent CVA.  [facility]ospital[facility] 11[facility]2019 for ischemic left cerebellar stroke. Had some dysphagia since his prior CVA.  Swallow evaluation was obtained and they are recommending dysphagia 2 diet. -Continue with aspirin and Lipitor. - mri tomorrow morning  AKI.  Resolved. Creatinine recent [facility] hospitalization trended around 1.1. Creatinine is improving with IV fluid, today 1.19. PO remains poor -Continue to monitor renal function. -Avoid nephrotoxins. - one more day of IVF @ 75  Partial urinary retention.  CT abdomen with prostatomegaly and bladder scan with more than 400 cc requiring one time in and out catheter.  There is some concern of urinary retention.  That can also contribute to AKI.  Apparently multiple soiled diapers, so most likely partial retention or incomplete emptying of bladder. Does not cooperate for post-void residual scan. - started on flomax - monitor  Essential hypertension.  Blood pressure wnl today -Continue with amlodipine. -Lisinopril can be restarted if needed.  Hyperlipidemia. -Continue with statin  Objective: Vitals:   01/02/20 0543 01/02/20 0611 01/02/20 0755 01/02/20 1137  BP: (!) 132/101 136/86 (!) 175/103 (!) 143/91  Pulse: 89 83 (!) 101 91  Resp: $Remo'19  16 18  'BmpBU$ Temp: 98.6 F (37 C)  98 F (36.7 C) 98.4 F (36.9 C)  TempSrc: Oral  Oral Oral  SpO2: 98%  98% 100%  Weight:      Height:        Intake/Output Summary (Last 24 hours) at 01/02/2020  [place] filed at 01/02/2020 1138 Gross per 24 hour  Intake 240 ml  Output 1450 ml  Net -1210 ml   Filed Weights   12/31/19 2008 12/31/19 2231  Weight: 74.8 kg  74.8 kg    Examination:  General exam: Appears calm and comfortable  Respiratory system: Clear to auscultation. Respiratory effort normal. Cardiovascular system: S1 & S2 heard, RRR. Gastrointestinal system: Soft, nontender, nondistended, bowel sounds positive. Central nervous system: moves all 4 extremities Extremities: No edema, no cyanosis, pulses intact and symmetrical. Psychiatry: unable to assess   DVT prophylaxis: Lovenox Code Status: Full Family Communication: none @ bedside Disposition Plan:  Status is: Inpatient  Remains inpatient appropriate because:Inpatient level of care appropriate due to severity of illness   Dispo: The patient is from: SNF              Anticipated d/c is to: pt/ot consult pending, per nursing family says they want home with home health              Anticipated d/c date is: 2-3 days              Patient currently is not medically stable to d/c.  Consultants:   Neurology  Procedures:  Antimicrobials:  S/p cefepime/flagyl Azithromycin 12/7>  Data Reviewed: I have personally reviewed following labs and imaging studies  CBC: Recent Labs  Lab 12/31/19 2015 01/01/20 0609 01/02/20 0506  WBC 10.5 7.9 4.9  HGB 14.6 12.6* 12.6*  HCT 42.5 36.6* 36.3*  MCV 89.1 88.4 89.0  PLT 382 254 742   Basic Metabolic Panel: Recent Labs  Lab 12/31/19 2015 01/01/20 0609 01/02/20 0506  NA 139 137 137  K 3.7 3.6 3.7  CL 108 110 110  CO2 19* 18* 20*  GLUCOSE 169* 126* 94  BUN $Re'23 21 14  'khW$ CREATININE 1.48* 1.31* 1.19  CALCIUM 9.6 8.9 8.8*   GFR: Estimated Creatinine Clearance: 61.1 mL/min (by C-G formula based on SCr of 1.19 mg/dL). Liver Function Tests: Recent Labs  Lab 12/31/19 2015 01/01/20 0609  AST 22 21  ALT 19 16  ALKPHOS 98 82  BILITOT 1.0 1.0  PROT 7.9 6.5  ALBUMIN 3.7 3.1*   Recent Labs  Lab 12/31/19 2015  LIPASE 33   Recent Labs  Lab 12/31/19 2059  AMMONIA 13   Coagulation Profile: Recent Labs  Lab 12/31/19 2048   INR 1.1   Cardiac Enzymes: No results for input(s): CKTOTAL, CKMB, CKMBINDEX, TROPONINI in the last 168 hours. BNP (last 3 results) No results for input(s): PROBNP in the last 8760 hours. HbA1C: No results for input(s): HGBA1C in the last 72 hours. CBG: No results for input(s): GLUCAP in the last 168 hours. Lipid Profile: No results for input(s): CHOL, HDL, LDLCALC, TRIG, CHOLHDL, LDLDIRECT in the last 72 hours. Thyroid Function Tests: No results for input(s): TSH, T4TOTAL, FREET4, T3FREE, THYROIDAB in the last 72 hours. Anemia Panel: No results for input(s): VITAMINB12, FOLATE, FERRITIN, TIBC, IRON, RETICCTPCT in the last 72 hours. Sepsis Labs: Recent Labs  Lab 12/31/19 2353 01/01/20 0317 01/01/20 0609 01/02/20 0506  LATICACIDVEN 6.0* 4.5* 2.6* 1.0    Recent Results (from the past 240 hour(s))  Resp Panel by RT-PCR (Flu A&B, Covid) Nasopharyngeal Swab     Status: None   Collection Time: 12/31/19  8:48 PM   Specimen: Nasopharyngeal Swab; Nasopharyngeal(NP) swabs in vial transport medium  Result Value Ref Range Status   SARS Coronavirus 2 by RT PCR NEGATIVE NEGATIVE Final  Comment: (NOTE) SARS-CoV-2 target nucleic acids are NOT DETECTED.  The SARS-CoV-2 RNA is generally detectable in upper respiratory specimens during the acute phase of infection. The lowest concentration of SARS-CoV-2 viral copies this assay can detect is 138 copies/mL. A negative result does not preclude SARS-Cov-2 infection and should not be used as the sole basis for treatment or other patient management decisions. A negative result may occur with  improper specimen collection/handling, submission of specimen other than nasopharyngeal swab, presence of viral mutation(s) within the areas targeted by this assay, and inadequate number of viral copies(<138 copies/mL). A negative result must be combined with clinical observations, patient history, and epidemiological information. The expected result is  Negative.  Fact Sheet for Patients:  EntrepreneurPulse.com.au  Fact Sheet for Healthcare Providers:  IncredibleEmployment.be  This test is no t yet approved or cleared by the [place] FDA and  has been authorized for detection and/or diagnosis of SARS-CoV-2 by FDA under an Emergency Use Authorization (EUA). This EUA will remain  in effect (meaning this test can be used) for the duration of the COVID-19 declaration under Section 564(b)(1) of the Act, 21 U.S.C.section 360bbb-3(b)(1), unless the authorization is terminated  or revoked sooner.       Influenza A by PCR NEGATIVE NEGATIVE Final   Influenza B by PCR NEGATIVE NEGATIVE Final    Comment: (NOTE) The Xpert Xpress SARS-CoV-2/FLU/RSV plus assay is intended as an aid in the diagnosis of influenza from Nasopharyngeal swab specimens and should not be used as a sole basis for treatment. Nasal washings and aspirates are unacceptable for Xpert Xpress SARS-CoV-2/FLU/RSV testing.  Fact Sheet for Patients: EntrepreneurPulse.com.au  Fact Sheet for Healthcare Providers: IncredibleEmployment.be  This test is not yet approved or cleared by the [place] FDA and has been authorized for detection and/or diagnosis of SARS-CoV-2 by FDA under an Emergency Use Authorization (EUA). This EUA will remain in effect (meaning this test can be used) for the duration of the COVID-19 declaration under Section 564(b)(1) of the Act, 21 U.S.C. section 360bbb-3(b)(1), unless the authorization is terminated or revoked.  Performed at [facility], [address], [place], [place] [address]   Urine Culture     Status: None   Collection Time: 12/31/19  9:15 PM   Specimen: Urine, Random  Result Value Ref Range Status   Specimen Description   Final    URINE, RANDOM Performed at [facility], [address] [place], [place] [address]    Special Requests    Final    NONE Performed at [facility], [address] [place], [place] [address]    Culture   Final    NO GROWTH Performed at [facility], [place] [address], [place], [place] [address]    Report Status 01/02/2020 FINAL  Final  C Difficile Quick Screen w PCR reflex     Status: None   Collection Time: 01/01/20  6:09 AM   Specimen: STOOL  Result Value Ref Range Status   C Diff antigen NEGATIVE NEGATIVE Final   C Diff toxin NEGATIVE NEGATIVE Final   C Diff interpretation No C. difficile detected.  Final    Comment: Performed at [facility], [address] [place], [place] [address]  Gastrointestinal Panel by PCR , Stool     Status: Abnormal   Collection Time: 01/01/20  6:09 AM   Specimen: STOOL  Result Value Ref Range Status   Campylobacter species NOT DETECTED NOT DETECTED Final   Plesimonas shigelloides  NOT DETECTED NOT DETECTED Final   Salmonella species NOT DETECTED NOT DETECTED Final   Yersinia enterocolitica NOT DETECTED NOT DETECTED Final   Vibrio species NOT DETECTED NOT DETECTED Final   Vibrio cholerae NOT DETECTED NOT DETECTED Final   Enteroaggregative E coli (EAEC) NOT DETECTED NOT DETECTED Final   Enteropathogenic E coli (EPEC) DETECTED (A) NOT DETECTED Final    Comment: RESULT CALLED TO, READ BACK BY AND VERIFIED WITH: ISABELLA LAPIETRA ON 01/01/20 AT 0751 QSD    Enterotoxigenic E coli (ETEC) NOT DETECTED NOT DETECTED Final   Shiga like toxin producing E coli (STEC) NOT DETECTED NOT DETECTED Final   Shigella/Enteroinvasive E coli (EIEC) NOT DETECTED NOT DETECTED Final   Cryptosporidium NOT DETECTED NOT DETECTED Final   Cyclospora cayetanensis NOT DETECTED NOT DETECTED Final   Entamoeba histolytica NOT DETECTED NOT DETECTED Final   Giardia lamblia NOT DETECTED NOT DETECTED Final   Adenovirus F40/41 NOT DETECTED NOT DETECTED Final   Astrovirus NOT DETECTED NOT DETECTED Final   Norovirus GI/GII NOT DETECTED NOT DETECTED Final   Rotavirus A  NOT DETECTED NOT DETECTED Final   Sapovirus (I, II, IV, and V) NOT DETECTED NOT DETECTED Final    Comment: Performed at [facility], [address] [place], [place] [address]  Blood culture (routine x 2)     Status: None (Preliminary result)   Collection Time: 01/01/20 11:47 AM   Specimen: BLOOD  Result Value Ref Range Status   Specimen Description BLOOD LEFT ARM  Final   Special Requests   Final    BOTTLES DRAWN AEROBIC AND ANAEROBIC Blood Culture results may not be optimal due to an excessive volume of blood received in culture bottles   Culture   Final    NO GROWTH 1 DAY Performed at [facility], [address] [place], [place] [address]    Report Status PENDING  Incomplete  Blood culture (routine x 2)     Status: None (Preliminary result)   Collection Time: 01/01/20 12:36 PM   Specimen: BLOOD  Result Value Ref Range Status   Specimen Description BLOOD BLOOD RIGHT HAND  Final   Special Requests   Final    BOTTLES DRAWN AEROBIC ONLY Blood Culture results may not be optimal due to an inadequate volume of blood received in culture bottles   Culture   Final    NO GROWTH 1 DAY Performed at [facility], [address] [place], [place] [address]    Report Status PENDING  Incomplete     Radiology Studies: EEG  Result Date: 01/02/2020 Lora Havens, MD     01/02/2020 12:08 PM Patient Name: Jefrey Raburn MRN: [id number] Epilepsy Attending: Lora Havens Referring Physician/Provider: Dr Donnetta Simpers Date: 01/02/2020 Duration: 25.27 mins Patient history: 69 y.o. 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 He was noted to have right eye deviation with twitching eye movements concerning for a possible seizure. EEG to evaluate for seizure Level of alertness:  Awake, asleep AEDs during EEG study: LEV Technical aspects: This EEG study was done with scalp electrodes positioned according to the 10-20 International system of electrode placement. Electrical activity was acquired at a sampling rate of $Remov'500Hz'ueuahn$  and reviewed with a high frequency filter of $RemoveB'70Hz'dCfypcRR$  and a low frequency filter of $RemoveB'1Hz'sGSclkVy$ . EEG data were recorded continuously and digitally  stored. Description: The posterior dominant rhythm consists of 8 Hz activity of moderate voltage (25-35 uV) seen predominantly in posterior head regions, symmetric and reactive to eye opening and eye closing. Sleep was characterized by vertex waves, sleep spindles (12 to 14 Hz), maximal frontocentral region.  EEG showed intermittent generalized 3 to 6 Hz theta-delta slowing. Hyperventilation and photic stimulation were not performed.   ABNORMALITY -Intermittent slow, generalized IMPRESSION: This study is suggestive of mild diffuse encephalopathy, nonspecific etiology. No seizures or epileptiform discharges were seen throughout the recording. Lora Havens   CT Head Wo Contrast  Result Date: 12/31/2019 CLINICAL DATA:  Seizure nontraumatic. Unresponsive. Rhythmic eye movements. EXAM: CT HEAD WITHOUT CONTRAST TECHNIQUE: Contiguous axial images were obtained from the base of the skull through the vertex without intravenous contrast. COMPARISON:  CT head 12/09/2019. FINDINGS: Brain: Patchy and confluent areas of decreased attenuation are noted throughout the deep and periventricular white matter of the cerebral hemispheres bilaterally, compatible with chronic microvascular ischemic disease. Redemonstration of a left lacunar infarction. No evidence of large-territorial acute infarction. No parenchymal hemorrhage. No mass lesion. No extra-axial collection. No mass effect or midline shift. No hydrocephalus. Basilar cisterns are patent. Vascular: No hyperdense vessel. Skull: No acute fracture or focal lesion. Sinuses/Orbits: Paranasal sinuses  and mastoid air cells are clear. The orbits are unremarkable. Other: None. IMPRESSION: No acute intracranial abnormality. Electronically Signed   By: Iven Finn M.D.   On: 12/31/2019 21:50   MR BRAIN WO CONTRAST  Result Date: 01/01/2020 CLINICAL DATA:  Initial evaluation for acute seizure. EXAM: MRI HEAD WITHOUT CONTRAST TECHNIQUE: Multiplanar, multiecho pulse sequences of the brain and surrounding structures were obtained without intravenous contrast. COMPARISON:  Prior head CT from 12/31/2019. FINDINGS: Brain: Examination moderately degraded by motion artifact. Diffuse prominence of the CSF containing spaces compatible generalized age-related cerebral atrophy. Six patchy and confluent T2/FLAIR hyperintensity within the periventricular deep white matter both cerebral hemispheres most consistent with chronic small vessel ischemic disease, moderate in nature. Few scatter remote lacunar infarcts present about the left basal ganglia/corona radiata. There is subtly increased diffusion signal abnormality involving the pons, with extension into the left middle cerebellar peduncle (series 5, image 13). Additionally, there is subtly increased diffuse diffusion abnormality within the adjacent left cerebellar hemisphere (series 5, images 11, 9). Subtly increased associated FLAIR signal abnormality seen as well (series 13, images 8, 7, 6). No significant regional mass effect or edema. Adjacent fourth ventricle remains widely patent as do the basilar cisterns. No associated susceptibility artifact to suggest hemorrhage. Findings are nonspecific. No other abnormal foci of restricted diffusion to suggest acute or subacute ischemia. Gray-white matter differentiation otherwise maintained. No foci of susceptibility artifact to suggest acute or chronic intracranial hemorrhage. No mass lesion, mass effect, or midline shift. Ventricles normal size without hydrocephalus. No extra-axial fluid collection. Pituitary gland and  suprasellar region within normal limits. Midline structures grossly intact. Vascular: Major intracranial vascular flow voids are grossly maintained at the skull base. Diminutive vertebrobasilar system noted, suggesting fetal type origin of the PCAs. Skull and upper cervical spine: Craniocervical junction grossly within normal limits. No focal marrow replacing lesion. No scalp soft tissue abnormality. Sinuses/Orbits: Globes and orbital soft tissues within normal limits. Paranasal sinuses are clear. No mastoid effusion. Inner ear structures grossly normal. Other: None. IMPRESSION: 1. Technically limited exam due to extensive motion artifact. 2. Subtly increased diffusion abnormality involving the pons, left middle cerebellar peduncle, and adjacent left cerebellar hemisphere as above. Findings are nonspecific, but could reflect  changes related to acute seizure given provided history. Correlation with EEG suggested. Differential considerations include changes related to acute/subacute ischemia versus a nonspecific cerebritis/encephalitis. Correlation with LP and CSF values also suggested as clinically warranted. Changes of PRES are considered, although felt to be less likely given the relative lack of additional changes elsewhere within the brain. 3. No other acute intracranial abnormality. 4. Age-related cerebral atrophy with moderate chronic small vessel ischemic disease, with a few scattered remote lacunar infarcts about the left basal ganglia/corona radiata. Electronically Signed   By: Jeannine Boga M.D.   On: 01/01/2020 01:56   CT ABDOMEN PELVIS W CONTRAST  Result Date: 12/31/2019 CLINICAL DATA:  Abdominal distension. EXAM: CT ABDOMEN AND PELVIS WITH CONTRAST TECHNIQUE: Multidetector CT imaging of the abdomen and pelvis was performed using the standard protocol following bolus administration of intravenous contrast. CONTRAST:  176mL OMNIPAQUE IOHEXOL 300 MG/ML  SOLN COMPARISON:  None. FINDINGS: Lower  chest: Linear atelectasis versus scarring within the right lower lobe. Otherwise no acute abnormality. Hepatobiliary: There is a 2.6 cm fluid density lesion within the right hepatic lobe that likely represents a simple hepatic cyst (2:12). There is a subjacent subcentimeter hypodensity that is too small to characterize. Pancreas: No focal lesion. Normal pancreatic contour. No surrounding inflammatory changes. No main pancreatic ductal dilatation. Spleen: Normal in size without focal abnormality. Adrenals/Urinary Tract: No adrenal nodule bilaterally. Bilateral kidneys enhance symmetrically. No hydronephrosis. No hydroureter. The urinary bladder is unremarkable. Stomach/Bowel: Stomach is within normal limits. Several loops of distal small bowel demonstrates circumferential bowel wall thickening as well as submucosal hyperenhancement. No small bowel dilatation. No pneumatosis. No evidence of large bowel wall thickening or dilatation. Appendix appears normal. Vascular/Lymphatic: No abdominal aorta or iliac aneurysm. No abdominal, pelvic, or inguinal lymphadenopathy. Reproductive: The prostate gland is enlarged measuring up to 5.3 cm. Other: No intraperitoneal free fluid. No intraperitoneal free gas. No organized fluid collection. Musculoskeletal: No abdominal wall hernia or abnormality No suspicious lytic or blastic osseous lesions. Old healed bilateral anterior rib fractures. No acute displaced fracture. Multilevel degenerative changes of the spine with intervertebral disc space vacuum phenomenon endplate sclerosis at the L5-S1 level. IMPRESSION: 1. Findings suggestive of enteritis. 2. Prostatomegaly.  Consider correlating with PSA levels. Electronically Signed   By: Iven Finn M.D.   On: 12/31/2019 22:01   DG Chest Port 1 View  Result Date: 12/31/2019 CLINICAL DATA:  Unresponsive EXAM: PORTABLE CHEST 1 VIEW COMPARISON:  December 09, 2019 FINDINGS: The heart size and mediastinal contours are within normal  limits. Again noted is slight elevation of the right hemidiaphragm. Subsegmental atelectasis seen at the right lung base. The visualized skeletal structures are unremarkable. IMPRESSION: Subsegmental atelectasis at the right lung base. Electronically Signed   By: Prudencio Pair M.D.   On: 12/31/2019 21:06    Scheduled Meds: . amLODipine  5 mg Oral Daily  . aspirin  81 mg Oral Daily  . atorvastatin  80 mg Oral q1800  . cromolyn  2 drop Left Eye QID  . enoxaparin (LOVENOX) injection  40 mg Subcutaneous Q24H  . erythromycin   Left Eye Once  . fluticasone  2 spray Each Nare Daily  . levETIRAcetam  500 mg Oral BID  . loratadine  10 mg Oral Daily  . tamsulosin  0.4 mg Oral QPC supper   Continuous Infusions: . sodium chloride 75 mL/hr at 01/02/20 0615  . azithromycin 500 mg (01/01/20 1716)  . levETIRAcetam 500 mg (01/02/20 0617)     LOS: 2  days   Time spent: 45 minutes.  Desma Maxim, MD [facility]  If 7PM-7AM, please contact night-coverage [email or web address]  01/02/2020, 2:25 PM

## 2020-01-02 NOTE — Plan of Care (Signed)

## 2020-01-02 NOTE — Progress Notes (Signed)
T/C to the patients sister [name] to inform patient sister that the patient is moving to room 111. Pt sister aware.

## 2020-01-02 NOTE — Progress Notes (Addendum)
Speech Language Pathology Treatment: Dysphagia  Patient Details Name: Alexander Byrd MRN: [id number] DOB: 11-16-1950 Today's Date: 01/02/2020 Time: 1420-1510 SLP Time Calculation (min) (ACUTE ONLY): 50 min  Assessment / Plan / Recommendation Clinical Impression  Pt seen today for ongoing assessment of toleration of diet. Pt is currently on a Dysphagia level 2 w/ Nectar liquids; added purees at meals. NSG reported good toleration of diet today; and Pills Crushed in Puree. Pt requires feeding assistance but will hold Cup when drinking w/ SLP.  Pt continues to present w/ oropharyngeal phase dysphagia w/ suspected Neuromuscular impact; recent CVA ~1 month ago. Also noted declined Cognitive status impacting his overall awareness/engagement during po tasks; he requires Mod verbal/tactile cues for follow through w/ tasks. Pt has noted "Age-related cerebral atrophy with moderate chronic small vessel ischemic disease" per MRI. Pt's overall presentation increases risk for aspiration thus Pulmonary decline.  Pt required full support to position upright for po's; then mod tactile/verbalcues for orientation to bolus presentation, follow through w/ tasks, and self-feeding support. Pt consumed trials of singe Nectar liquids via cup helping to hold, purees, and minced solidsw/ no immediate, overt clinical s/s of aspiration noted; no decline in respiratory effort, no cough. Audible swallows noted intermittently. Oral phase was c/b slow, deliberate bolus management and oral clearing of all boluses given. He required increased Time for A-P transfer and swallow w/ all consistencies. A Munching pattern was noted w/ trials of minced foods, but w/ Time, he was able to prep the boluses for A-P transfer, swallow, and clear orally. Alternating foods/liquids were helpful in fully clearing shredded chicken x3 trials.  Recommend dysphagia level 2 diet(minced) w/ Nectar consistency liquids; aspiration precautions; Pills Crushed  in puree; feeding support and supervision at meals, reduce Distractions during meals, alternate foods/liquids, and check for oral clearing during/post intake. Thin liquids trials were Not assessed d/t the degree of cueing required during all po trials, and d/t pt's overall decreased awareness and attention to task. D/t Baseline Dysphagia dx in chart notes, suspect pt may be at/near his Baseline. Would recommend continue w/ a dysphagia diet at D/C for safety of oral intake and reducing risk for aspiration, pneumonia. ST services can be available for any education w/ family as needed at D/C, handouts, ordering info, and thickened liquids. NSG/MD updated.    HPI HPI: Pt is a 69 y.o. 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 More confusion per family report as well. Patient is altered on arrival and unable to write any verbal history.  He is accompanied by his sister who states that typically his able to speak and only has weakness in his left side.  Unsure of pt's Baseline Cognitive-communication status or functional status for safe po intake, self-feeding as there were no notes in the chart in Care Everywhere from his recent CVA at "[facility]" ~3 weeks ago.       SLP Plan  All goals met       Recommendations  Diet recommendations: Dysphagia 2 (fine chop);Nectar-thick liquid (w/ added purees) Liquids provided via: Cup;No straw Medication Administration: Crushed with puree (for safer swallowing) Supervision: Staff to assist with self feeding;Full supervision/cueing for compensatory strategies (pt to hold cup) Compensations: Minimize environmental distractions;Slow rate;Small sips/bites;Lingual sweep for clearance of pocketing;Follow solids with  liquid;Multiple dry swallows  after each bite/sip Postural Changes and/or Swallow Maneuvers: Upright 30-60 min after meal;Seated upright 90 degrees;Out of bed for meals                General recommendations:  (Dietician f/u; Palliative care consult for [place]) Oral Care Recommendations: Oral care BID;Oral care before and after PO;Staff/trained caregiver to provide oral care Follow up Recommendations: Skilled Nursing facility (TBD) SLP Visit Diagnosis: Dysphagia, oropharyngeal phase (R13.12) (baseline CVA) Plan: All goals met       GO                 Alexander Kenner, MS, CCC-SLP Speech Language Pathologist Rehab Services [phone] [facility] 01/02/2020, 4:17 PM

## 2020-01-02 NOTE — Progress Notes (Signed)
EEG completed, results pending. 

## 2020-01-02 NOTE — Progress Notes (Signed)
NEUROLOGY CONSULTATION PROGRESS NOTE   Date of service: January 02, 2020 Patient Name: Alexander Byrd MRN:  [id number] DOB:  1950-12-17  Brief HPI   Alexander Byrd is a 69 y.o. 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 He was noted to have right eye deviation with twitching eye movements concerning for a possible seizure. MRI shows subtle diffusion restriction in left cerebral hemisphere and left cerebellum consistent with a seizure.   Interval Hx   He is resting today. Wakes up and answers my questions and goes back to sleep.  Vitals   Vitals:   01/02/20 0543 01/02/20 0611 01/02/20 0755 01/02/20 1137  BP: (!) 132/101 136/86 (!) 175/103 (!) 143/91  Pulse: 89 83 (!) 101 91  Resp: 19  16 18   Temp: 98.6 F (37 C)  98 F (36.7 C) 98.4 F (36.9 C)  TempSrc: Oral  Oral Oral  SpO2: 98%  98% 100%  Weight:      Height:         Body mass index is 22.37 kg/m.  Physical Exam   General: Laying comfortably in bed; in no acute distress. HENT: Normal oropharynx and mucosa. Normal external appearance of ears and nose. Neck: Supple, no pain or tenderness  CV: No JVD. No peripheral edema.  Pulmonary: Symmetric Chest rise. Normal respiratory effort.  Abdomen: Soft to touch, non-tender.  Ext: No cyanosis, edema, or deformity  Skin: No rash. Normal palpation of skin.   Musculoskeletal: Normal digits and nails by inspection. No clubbing.   Neurologic Examination  Mental status/Cognition: Somnolent, wakes up to answer questions. oriented to self, place and goes back to sleep  Speech/language: dysarthric speech, fluent, comprehension intact, object naming intact, repetition intact. Cranial nerves:   CN II Pupils equal and reactive to light, no VF deficits    CN III,IV,VI EOM intact, no gaze  preference or deviation, no nystagmus    CN V normal sensation in V1, V2, and V3 segments bilaterally    CN VII no asymmetry, no nasolabial fold flattening    CN VIII normal hearing to speech    CN IX & X normal palatal elevation, no uvular deviation    CN XI    CN XII midline tongue protrusion   Motor:  Muscle bulk: poor, tone normal, pronator drift yes in LUE tremor none Does not participate in full strength testing but grip strength is 4 on the left and 4+ on the right.  Reflexes:  Right Left Comments  Pectoralis      Biceps (C5/6) 2 2   Brachioradialis (C5/6) 2 2    Triceps (C6/7) 2 2    Patellar (L3/4) 2 2    Achilles (S1)      Hoffman      Plantar     Jaw jerk    Sensation:  Light touch Intact throughout   Pin prick    Temperature    Vibration   Proprioception    Coordination/Complex Motor:  - Finger to Nose intact BL - Heel to shin could not get him to do due to poor participation.  Labs   Basic Metabolic Panel:  Lab Results  Component Value Date   NA 137 01/02/2020   K 3.7 01/02/2020   CO2 20 (L) 01/02/2020   GLUCOSE 94 01/02/2020   BUN 14 01/02/2020   CREATININE  1.19 01/02/2020   CALCIUM 8.8 (L) 01/02/2020   GFRNONAA >60 01/02/2020   HbA1c: No results found for: HGBA1C LDL: No results found for: [facility] Urine Drug Screen: No results found for: LABOPIA, COCAINSCRNUR, LABBENZ, AMPHETMU, THCU, LABBARB  Alcohol Level No results found for: ETH No results found for: PHENYTOIN, ZONISAMIDE, LAMOTRIGINE, LEVETIRACETA No results found for: PHENYTOIN, PHENOBARB, VALPROATE, CBMZ  Imaging and Diagnostic studies  Results for orders placed during the hospital encounter of 12/31/19  MR BRAIN WO CONTRAST 1. Technically limited exam due to extensive motion artifact. 2. Subtly increased diffusion abnormality involving the pons, left middle cerebellar peduncle, and adjacent left cerebellar hemisphere as above. Findings are nonspecific, but could reflect  changes related to acute seizure given provided history. Correlation with EEG suggested. Differential considerations include changes related to acute/subacute ischemia versus a nonspecific cerebritis/encephalitis. Correlation with LP and CSF values also suggested as clinically warranted. Changes of PRES are considered, although felt to be less likely given the relative lack of additional changes elsewhere within the brain. 3. No other acute intracranial abnormality. 4. Age-related cerebral atrophy with moderate chronic small vessel ischemic disease, with a few scattered remote lacunar infarcts about the left basal ganglia/corona radiata.  REEG: Normal, no epileptogeni discharges, no seizures.  Impression   Alexander Byrd is a 69 y.o. 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 MRI with subtle DWI changes in L hemisphere which probably correlate with the noted R Eye deviation and R eye twitching noted by RN.  However, sublt cortical DWI changes can also be seen in other inflammatory, infectious and autoimmune processing and if this is truly just from seizure, this should resolved on repeat MRI which is ordered for tomorrow.  My suspicion for a CNS infection is low due to no fever, no leukocytosis, no nuchal rigidity, no brudzinski or kernigs sign, no headache endorsed by patient.  Recommendations  - routine EEG with no seizures, no epileptiform dischagres - Repeat MRI Brain with and without contrast is pending. - Continue Keppra for now @ 500mg  BID. Will discuss maintenance Keppra after repeat MRI. Suspect that seizure was probably provoked due to enteritis. ______________________________________________________________________   Thank you for the opportunity to take part in the care of this patient. If you have any further questions, please contact the neurology  consultation attending.  Signed,  [facility] Pager Number Erick Blinks

## 2020-01-02 NOTE — TOC Progression Note (Signed)
Transition of Care [facility]) - Progression Note    Patient Details  Name: Alexander Byrd MRN: [id number] Date of Birth: 1950/02/03  Transition of Care [facility]) CM/SW Contact  Hetty Ely, RN Phone Number: 01/02/2020, 3:53 PM  Clinical Narrative:   Spoke with patients sister, [name], she is requesting that patient be discharged in her care. She states patient will have round the clock care and will be safe in receiving the care needed. I did agree to assist as needed with discharge care orders. [name] was given my phone number to call if needed.         Expected Discharge Plan and Services                                                 Social Determinants of Health (SDOH) Interventions    Readmission Risk Interventions No flowsheet data found.

## 2020-01-02 NOTE — Progress Notes (Signed)
patients sister [name] [phone]). Pt sister stated the patient came from [facility]. Pt sister stated that the patient can not go back to that facility. Pt sister stated that they would like for the patient to go home. Pt sister stated that she is in the process of getting the patient care in his home. Pt sister stated she would like to speak to the case manager. Case manager Levi Aland Ceaser notified via secure chat.

## 2020-01-02 NOTE — Progress Notes (Signed)
   01/02/20 0757  Neurological  Neuro (WDL) X  Orientation Level Oriented to person;Oriented to place;Oriented to situation;Disoriented to time (Pt stated he knew he was in  and in the hospital.)  Pt stated he wanted to know who signed him in the hospital and why.

## 2020-01-03 ENCOUNTER — Inpatient Hospital Stay (HOSPITAL_COMMUNITY): Admission: AD | Admit: 2020-01-03 | Payer: Medicare Other | Admitting: Neurology

## 2020-01-03 ENCOUNTER — Encounter: Payer: Self-pay | Admitting: Internal Medicine

## 2020-01-03 ENCOUNTER — Inpatient Hospital Stay: Payer: Medicare Other

## 2020-01-03 DIAGNOSIS — A09 Infectious gastroenteritis and colitis, unspecified: Secondary | ICD-10-CM

## 2020-01-03 DIAGNOSIS — G450 Vertebro-basilar artery syndrome: Secondary | ICD-10-CM

## 2020-01-03 LAB — MRSA PCR SCREENING: MRSA by PCR: NEGATIVE

## 2020-01-03 LAB — BASIC METABOLIC PANEL
Anion gap: 8 (ref 5–15)
BUN: 12 mg/dL (ref 8–23)
CO2: 22 mmol/L (ref 22–32)
Calcium: 8.5 mg/dL — ABNORMAL LOW (ref 8.9–10.3)
Chloride: 108 mmol/L (ref 98–111)
Creatinine, Ser: 1.05 mg/dL (ref 0.61–1.24)
GFR, Estimated: >60 mL/min (ref >60)
Glucose, Bld: 98 mg/dL (ref 70–99)
Potassium: 3.4 mmol/L — ABNORMAL LOW (ref 3.5–5.1)
Sodium: 138 mmol/L (ref 135–145)

## 2020-01-03 LAB — GLUCOSE, CAPILLARY
Glucose-Capillary: 87 mg/dL (ref 70–99)
Glucose-Capillary: 81 mg/dL (ref 70–99)

## 2020-01-03 LAB — HEMOGLOBIN A1C
Hgb A1c MFr Bld: 5.9 % — ABNORMAL HIGH (ref 4.8–5.6)
Mean Plasma Glucose: 122.63 mg/dL

## 2020-01-03 LAB — LIPID PANEL
Cholesterol: 84 mg/dL (ref 0–200)
HDL: 28 mg/dL — ABNORMAL LOW (ref >40)
LDL Cholesterol: 42 mg/dL (ref 0–99)
Total CHOL/HDL Ratio: 3 RATIO
Triglycerides: 68 mg/dL (ref <150)
VLDL: 14 mg/dL (ref 0–40)

## 2020-01-03 MED ORDER — CLOPIDOGREL BISULFATE 75 MG PO TABS
75.0000 mg | ORAL_TABLET | Freq: Every day | ORAL | Status: DC
  Administered 2020-01-03 – 2020-01-09 (×7): 75 mg via ORAL
  Filled 2020-01-03 (×7): qty 1

## 2020-01-03 MED ORDER — STROKE: EARLY STAGES OF RECOVERY BOOK: Freq: Once | Status: AC

## 2020-01-03 MED ORDER — CLOPIDOGREL BISULFATE 75 MG PO TABS
300.0000 mg | ORAL_TABLET | Freq: Once | ORAL | Status: AC
  Administered 2020-01-03: 300 mg via ORAL
  Filled 2020-01-03: qty 4

## 2020-01-03 MED ORDER — IOHEXOL 350 MG/ML SOLN
75.0000 mL | Freq: Once | INTRAVENOUS | Status: AC | PRN
  Administered 2020-01-03: 75 mL via INTRAVENOUS

## 2020-01-03 MED ORDER — CHLORHEXIDINE GLUCONATE CLOTH 2 % EX PADS
6.0000 | MEDICATED_PAD | Freq: Every day | CUTANEOUS | Status: DC
  Administered 2020-01-03 – 2020-01-08 (×4): 6 via TOPICAL

## 2020-01-03 NOTE — Progress Notes (Signed)
SLP Cancellation Note  Patient Details Name: Alexander Byrd MRN: [id number] DOB: Jul 12, 1950   Cancelled treatment:       Reason Eval/Treat Not Completed: Medical issues which prohibited therapy;Patient not medically ready (chart reviewed; consulted NSG re: pt's status). Per chart orders, pt and HOB to be flat; pt is now NPO. Per further discussion w/ NSG, pt is being transferred to CCU currently.  ST services will hold on services today pending pt's decline in status and need for lying flat per orders.  Recommend frequent oral care and hygiene for stimulation of swallowing; aspiration precautions. ST services will f/u tomorrow. NSG agreed.     Jerilynn Som, MS, CCC-SLP Speech Language Pathologist Rehab Services [phone] [facility] 01/03/2020, 11:23 AM

## 2020-01-03 NOTE — Progress Notes (Signed)
    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

## 2020-01-03 NOTE — Progress Notes (Addendum)
SLP Cancellation Note  Patient Details Name: Alexander Byrd MRN: [id number] DOB: Jun 19, 1950   Cancelled treatment:       Reason Eval/Treat Not Completed: Medical issues which prohibited therapy;Patient not medically ready (chart reviewed; MD notes). Currently, pt is being followed by ST services for Dysphagia and has been tolerating a dysphagia diet including Nectar liquids via tsp/cup w/ Supervision by NSG. 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 CTA head and neck reviewed and shows diminutive and severely diseased vertebrobasilar system. Pt exhibits significant Cognitive-communication deficits c/b aphasia and dysarthria -- this has been apparent since admit. Unsure of pt's Baseline Cognitive status and degree of language deficits s/p his CVA ~4 weeks ago; pt's presentation has not significantly changed during this admit. Per report, pt was receiving services at SNF w/ dx'd dysphagia. He is arousable/alert to follow through w/ min basic instructions during po trials w/ SLP/NSG; following some instructions w/ NSG/MD. Recommend f/u w/ skilled ST services at D/C for cognitive-communication deficits when pt's acute medical and Neurological status' have stabilized. This need can be met at next venue of care. Current focus is dysphagia w/ modified diet consistency. NSG agreed.    [name], MS, CCC-SLP Speech Language Pathologist Rehab Services [phone] [facility] 01/03/2020, 7:45 AM

## 2020-01-03 NOTE — Progress Notes (Addendum)
CH visited pt. while rounding on ICU; pt. lying down in bed resting, sister Bee at bedside.  Sister shared pt. is scheduled for transfer to [facility] for further observation/tests; pt. has been having strokes, per sister, and second opinion is requested.  Sister says she and pt. live adjacent to one another in [place] and that they also have an additional sister, [place], who is close by.  Sister requested prayer for pt.'s smooth transition to [facility] and for healing; CH prayed for sister and pt.  CH remains available as needed.

## 2020-01-03 NOTE — Progress Notes (Signed)
PROGRESS NOTE    Alexander Byrd  KTG:[id number] DOB: [birth year] DOA: 12/31/2019 PCP: Jennette Bill., MD   Brief Narrative: Taken from H&P. Alexander Byrd is a 69 y.o. 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  Initially met severe sepsis criteria with tachypnea, tachycardia, lactic acidosis with lactic acid peaked at 6, and encephalopathy received IV fluid and broad-spectrum antibiotics.  GI pathogen with pathogenic E. Coli, blood and urine cultures pending. Apparently blood cultures were drawn after starting antibiotics. CT head without any acute changes but MRI brain with some questionable increased diffusion abnormality involving pons, left middle cerebellar peduncle and left cerebellar hemisphere, concern for seizures/encephalitis.  There is 1 nursing concern when patient with persistent right gaze and rapid nystagmus concerning for seizure and he was started on Keppra.  Neurology was consulted. CT abdomen with concern of enteritis. Speech evaluated him and recommending dysphagia 2 diet. AM of 12/8 abnormal MRI showing subacute cerebellar stroke  Subjective: Patient was resting comfortably when seen this morning.  Responds to simple questions. Moving all 4 extemities.   Assessment & Plan:   Active Problems:   Sepsis ([place])  CVA Ischemic left cerebellar stroke 11/21 hospitalized at [facility], since then dysphagia and left sided weakness. Repeat MRI performed this morning with L cerebellar PICA stroke. Seen by neurology, out of window for fibrinolytics, discussed w/ stroke team at [facility] who do not think needs acute intervention at this time.    - neuro following closely, appreciate their recis - f/u TTE - ICU status for q2 neuro checks for 24 hours, then  q4 -Continue with aspirin and Lipitor - cont tele - plavix loaded, will need to continue 75 daily for 3 months - bed rest with head of bed flat - permissive htn, hold amlodipine - IVF @ 100 - slp swallow eval ordered - ct head w/o contrast and ct angio head/neck for indications outlined in neuro consult note  Severe sepsis with acute enteritis.  POA. GI pathogen with epec. c diff neg. Lactic acid has normalized. Ct showing enteritis. Urine culture ngtd. Blood cultures also ngtd. Is s/p cefepime/flagyl. Hemodynamically stable. Stool output has decreased.  - continue azithromycin, 3rd and last dose will be today -Continue with gentle hydration   Encephalopathy. POA Likely 2/2 series of CVAs, acute infection likely contributory. Stable - monitor AKI.  Resolved. Creatinine recent [facility] hospitalization trended around 1.1. Creatinine is improving with IV fluid, today 1.19. PO remains poor -Continue to monitor renal function. -Avoid nephrotoxins. - one more day of IVF @ 75  Partial urinary retention.  CT abdomen with prostatomegaly and bladder scan with more than 400 cc requiring one time in and out catheter.  There is some concern of urinary retention.  That can also contribute to AKI.  Apparently multiple soiled diapers, so most likely partial retention or incomplete emptying of bladder. Does not cooperate for post-void residual scan. - started on flomax - monitor  Essential hypertension.  Blood pressure wnl today - hold amlodipine and lisinopril, plan for permissive htn.  Hyperlipidemia. -Continue with statin  Objective: Vitals:   01/03/20 0101 01/03/20 0535 01/03/20 0650 01/03/20 0752  BP: 117/71 129/78 120/67 135/84  Pulse: 87 81 80 83  Resp: _0 Temp: 98 F (36.7  C) 97.9 F (36.6 C) 98 F (36.7 C) 98 F (36.7 C)  TempSrc:  Oral  Oral  SpO2: 97% 97% 97% (!) 86%  Weight:      Height:        Intake/Output Summary (Last 24 hours) at 01/03/2020 1103 Last data filed  at 01/03/2020 0500 Gross per 24 hour  Intake 3643.81 ml  Output 1900 ml  Net 1743.81 ml   Filed Weights   12/31/19 2008 12/31/19 2231  Weight: 74.8 kg 74.8 kg    Examination:  General exam: Appears calm and comfortable  Respiratory system: Clear to auscultation. Respiratory effort normal. Cardiovascular system: S1 & S2 heard, RRR. Gastrointestinal system: Soft, nontender, nondistended, bowel sounds positive. Central nervous system: moves all 4 extremities Extremities: No edema, no cyanosis, pulses intact and symmetrical. Psychiatry: unable to assess   DVT prophylaxis: Lovenox Code Status: Full Family Communication: none @ bedside Disposition Plan:  Status is: Inpatient  Remains inpatient appropriate because:Inpatient level of care appropriate due to severity of illness   Dispo: The patient is from: SNF              Anticipated d/c is to: pt/ot consult on hold, per nursing family says they want home with home health              Anticipated d/c date is: 2-3 days              Patient currently is not medically stable to d/c.  Consultants:   Neurology  Procedures:  Antimicrobials:  S/p cefepime/flagyl Azithromycin 12/7>12/9  Data Reviewed: I have personally reviewed following labs and imaging studies  CBC: Recent Labs  Lab 12/31/19 2015 01/01/20 0609 01/02/20 0506  WBC 10.5 7.9 4.9  HGB 14.6 12.6* 12.6*  HCT 42.5 36.6* 36.3*  MCV 89.1 88.4 89.0  PLT 382 254 480   Basic Metabolic Panel: Recent Labs  Lab 12/31/19 2015 01/01/20 0609 01/02/20 0506 01/03/20 0521  NA 139 137 137 138  K 3.7 3.6 3.7 3.4*  CL 108 110 110 108  CO2 19* 18* 20* 22  GLUCOSE 169* 126* 94 98  BUN _0 CREATININE 1.48* 1.31* 1.19 1.05  CALCIUM 9.6 8.9 8.8* 8.5*   GFR: Estimated Creatinine Clearance: 69.3 mL/min (by C-G formula based on SCr of 1.05 mg/dL). Liver Function Tests: Recent Labs  Lab 12/31/19 2015 01/01/20 0609  AST 22 21  ALT 19 16  ALKPHOS 98 82   BILITOT 1.0 1.0  PROT 7.9 6.5  ALBUMIN 3.7 3.1*   Recent Labs  Lab 12/31/19 2015  LIPASE 33   Recent Labs  Lab 12/31/19 2059  AMMONIA 13   Coagulation Profile: Recent Labs  Lab 12/31/19 2048  INR 1.1   Cardiac Enzymes: No results for input(s): CKTOTAL, CKMB, CKMBINDEX, TROPONINI in the last 168 hours. BNP (last 3 results) No results for input(s): PROBNP in the last 8760 hours. HbA1C: Recent Labs    01/03/20 0521  HGBA1C 5.9*   CBG: No results for input(s): GLUCAP in the last 168 hours. Lipid Profile: Recent Labs    01/03/20 0521  CHOL 84  HDL 28*  LDLCALC 42  TRIG 68  CHOLHDL 3.0   Thyroid Function Tests: No results for input(s): TSH, T4TOTAL, FREET4, T3FREE, THYROIDAB in the last 72 hours. Anemia Panel: No results for input(s): VITAMINB12, FOLATE, FERRITIN, TIBC, IRON, RETICCTPCT in the last 72 hours. Sepsis Labs: Recent Labs  Lab 12/31/19 2353 01/01/20 1655 01/01/20 3748 01/02/20 2707  LATICACIDVEN 6.0* 4.5* 2.6* 1.0    Recent Results (from the past 240 hour(s))  Resp Panel by RT-PCR (Flu A&B, Covid) Nasopharyngeal Swab     Status: None   Collection Time: 12/31/19  8:48 PM   Specimen: Nasopharyngeal Swab; Nasopharyngeal(NP) swabs in vial transport medium  Result Value Ref Range Status   SARS Coronavirus 2 by RT PCR NEGATIVE NEGATIVE Final    Comment: (NOTE) SARS-CoV-2 target nucleic acids are NOT DETECTED.  The SARS-CoV-2 RNA is generally detectable in upper respiratory specimens during the acute phase of infection. The lowest concentration of SARS-CoV-2 viral copies this assay can detect is 138 copies/mL. A negative result does not preclude SARS-Cov-2 infection and should not be used as the sole basis for treatment or other patient management decisions. A negative result may occur with  improper specimen collection/handling, submission of specimen other than nasopharyngeal swab, presence of viral mutation(s) within the areas targeted by  this assay, and inadequate number of viral copies(<138 copies/mL). A negative result must be combined with clinical observations, patient history, and epidemiological information. The expected result is Negative.  Fact Sheet for Patients:  EntrepreneurPulse.com.au  Fact Sheet for Healthcare Providers:  IncredibleEmployment.be  This test is no t yet approved or cleared by the [place] FDA and  has been authorized for detection and/or diagnosis of SARS-CoV-2 by FDA under an Emergency Use Authorization (EUA). This EUA will remain  in effect (meaning this test can be used) for the duration of the COVID-19 declaration under Section 564(b)(1) of the Act, 21 U.S.C.section 360bbb-3(b)(1), unless the authorization is terminated  or revoked sooner.       Influenza A by PCR NEGATIVE NEGATIVE Final   Influenza B by PCR NEGATIVE NEGATIVE Final    Comment: (NOTE) The Xpert Xpress SARS-CoV-2/FLU/RSV plus assay is intended as an aid in the diagnosis of influenza from Nasopharyngeal swab specimens and should not be used as a sole basis for treatment. Nasal washings and aspirates are unacceptable for Xpert Xpress SARS-CoV-2/FLU/RSV testing.  Fact Sheet for Patients: EntrepreneurPulse.com.au  Fact Sheet for Healthcare Providers: IncredibleEmployment.be  This test is not yet approved or cleared by the [place] FDA and has been authorized for detection and/or diagnosis of SARS-CoV-2 by FDA under an Emergency Use Authorization (EUA). This EUA will remain in effect (meaning this test can be used) for the duration of the COVID-19 declaration under Section 564(b)(1) of the Act, 21 U.S.C. section 360bbb-3(b)(1), unless the authorization is terminated or revoked.  Performed at [facility], [address], [place], [place] [address]   Urine Culture     Status: None   Collection Time: 12/31/19  9:15 PM    Specimen: Urine, Random  Result Value Ref Range Status   Specimen Description   Final    URINE, RANDOM Performed at [facility], [address], [place], [place] [address]    Special Requests   Final    NONE Performed at [facility], [address] [place], [place] [address]    Culture   Final    NO GROWTH Performed at [facility], [place] [address], [place], [place] [address]    Report Status 01/02/2020 FINAL  Final  C Difficile Quick Screen w PCR reflex     Status: None   Collection Time: 01/01/20  6:09 AM   Specimen: STOOL  Result Value Ref Range Status   C Diff antigen NEGATIVE NEGATIVE Final   C Diff toxin NEGATIVE NEGATIVE Final  C Diff interpretation No C. difficile detected.  Final    Comment: Performed at [facility], [address] [place], [place] [address]  Gastrointestinal Panel by PCR , Stool     Status: Abnormal   Collection Time: 01/01/20  6:09 AM   Specimen: STOOL  Result Value Ref Range Status   Campylobacter species NOT DETECTED NOT DETECTED Final   Plesimonas shigelloides NOT DETECTED NOT DETECTED Final   Salmonella species NOT DETECTED NOT DETECTED Final   Yersinia enterocolitica NOT DETECTED NOT DETECTED Final   Vibrio species NOT DETECTED NOT DETECTED Final   Vibrio cholerae NOT DETECTED NOT DETECTED Final   Enteroaggregative E coli (EAEC) NOT DETECTED NOT DETECTED Final   Enteropathogenic E coli (EPEC) DETECTED (A) NOT DETECTED Final    Comment: RESULT CALLED TO, READ BACK BY AND VERIFIED WITH: ISABELLA LAPIETRA ON 01/01/20 AT 0751 QSD    Enterotoxigenic E coli (ETEC) NOT DETECTED NOT DETECTED Final   Shiga like toxin producing E coli (STEC) NOT DETECTED NOT DETECTED Final   Shigella/Enteroinvasive E coli (EIEC) NOT DETECTED NOT DETECTED Final   Cryptosporidium NOT DETECTED NOT DETECTED Final   Cyclospora cayetanensis NOT DETECTED NOT DETECTED Final   Entamoeba histolytica NOT DETECTED NOT DETECTED Final    Giardia lamblia NOT DETECTED NOT DETECTED Final   Adenovirus F40/41 NOT DETECTED NOT DETECTED Final   Astrovirus NOT DETECTED NOT DETECTED Final   Norovirus GI/GII NOT DETECTED NOT DETECTED Final   Rotavirus A NOT DETECTED NOT DETECTED Final   Sapovirus (I, II, IV, and V) NOT DETECTED NOT DETECTED Final    Comment: Performed at [facility], [address] [place], [place] [address]  Blood culture (routine x 2)     Status: None (Preliminary result)   Collection Time: 01/01/20 11:47 AM   Specimen: BLOOD  Result Value Ref Range Status   Specimen Description BLOOD LEFT ARM  Final   Special Requests   Final    BOTTLES DRAWN AEROBIC AND ANAEROBIC Blood Culture results may not be optimal due to an excessive volume of blood received in culture bottles   Culture   Final    NO GROWTH 2 DAYS Performed at [facility], [address] [place], [place] [address]    Report Status PENDING  Incomplete  Blood culture (routine x 2)     Status: None (Preliminary result)   Collection Time: 01/01/20 12:36 PM   Specimen: BLOOD  Result Value Ref Range Status   Specimen Description BLOOD BLOOD RIGHT HAND  Final   Special Requests   Final    BOTTLES DRAWN AEROBIC ONLY Blood Culture results may not be optimal due to an inadequate volume of blood received in culture bottles   Culture   Final    NO GROWTH 2 DAYS Performed at [facility], [address] [place], [place] [address]    Report Status PENDING  Incomplete     Radiology Studies: EEG  Result Date: 01/02/2020 Lora Havens, MD     01/02/2020 12:08 PM Patient Name: Glen Kesinger MRN: [id number] Epilepsy Attending: Lora Havens Referring Physician/Provider: Dr Donnetta Simpers Date: 01/02/2020 Duration: 25.27 mins Patient history: 69 y.o. male with PMH significant for HTN, HLD, dysphagia, CAD, HTN, prior stroke about 3 weeks ago with residual left hemiparesis who is admitted with EPEC Gastroenteritis with AKI,   Tachycardia, tachypnea, elevated lactate and creatinine. He was noted to have right eye deviation with twitching eye movements concerning for a possible  seizure. EEG to evaluate for seizure Level of alertness: Awake, asleep AEDs during EEG study: LEV Technical aspects: This EEG study was done with scalp electrodes positioned according to the 10-20 International system of electrode placement. Electrical activity was acquired at a sampling rate of _0  and reviewed with a high frequency filter of _1  and a low frequency filter of _2 . EEG data were recorded continuously and digitally stored. Description: The posterior dominant rhythm consists of 8 Hz activity of moderate voltage (25-35 uV) seen predominantly in posterior head regions, symmetric and reactive to eye opening and eye closing. Sleep was characterized by vertex waves, sleep spindles (12 to 14 Hz), maximal frontocentral region.  EEG showed intermittent generalized 3 to 6 Hz theta-delta slowing. Hyperventilation and photic stimulation were not performed.   ABNORMALITY -Intermittent slow, generalized IMPRESSION: This study is suggestive of mild diffuse encephalopathy, nonspecific etiology. No seizures or epileptiform discharges were seen throughout the recording. Priyanka Barbra Sarks   CT ANGIO HEAD W OR WO CONTRAST  Result Date: 01/03/2020 CLINICAL DATA:  Initial evaluation for acute ataxia, follow-up exam for stroke. EXAM: CT ANGIOGRAPHY HEAD AND NECK TECHNIQUE: Multidetector CT imaging of the head and neck was performed using the standard protocol during bolus administration of intravenous contrast. Multiplanar CT image reconstructions and MIPs were obtained to evaluate the vascular anatomy. Carotid stenosis measurements (when applicable) are obtained utilizing NASCET criteria, using the distal internal carotid diameter as the denominator. CONTRAST:  66m OMNIPAQUE IOHEXOL 350 MG/ML SOLN COMPARISON:  Prior brain MRI from 01/02/2020. FINDINGS: CT HEAD  FINDINGS Brain: Age-related cerebral atrophy with moderate chronic microvascular ischemic disease. Remote lacunar infarcts noted at the left basal ganglia/corona radiata. Evolving cytotoxic edema involving the inferior left cerebellum consistent with previously identified left PICA territory infarcts. Additional previously described ischemic change about the left middle cerebellar peduncle and medulla not well visualized by CT. No evidence for hemorrhagic transformation or significant regional mass effect. No other acute large vessel territory infarct. No mass lesion, midline shift or mass effect. No hydrocephalus or extra-axial fluid collection. Vascular: No hyperdense vessel. Scattered vascular calcifications noted within the carotid siphons. Skull: Scalp soft tissues and calvarium within normal limits. Sinuses: Paranasal sinuses and mastoid air cells are clear. Orbits: Globes and orbital soft tissues within normal limits. CTA NECK FINDINGS Aortic arch: Visualized aortic arch of normal caliber with normal 3 vessel morphology. No hemodynamically significant stenosis about the origin of the great vessels. Right carotid system: Right common and internal carotid arteries widely patent without stenosis, dissection or occlusion. Left carotid system: Left common and internal carotid arteries widely patent without stenosis, dissection or occlusion. Vertebral arteries: Both vertebral arteries arise from the subclavian arteries. No proximal subclavian artery stenosis. Vertebral arteries are diffusely hypoplastic bilaterally but remain patent within the neck without stenosis, dissection or occlusion. Skeleton: No acute osseous finding. No discrete or worrisome osseous lesions. Moderate cervical spondylosis noted at C5-6 and C6-7. Poor dentition noted. Other neck: No other acute soft tissue abnormality within the neck. No mass or adenopathy. Upper chest: Visualized upper chest demonstrates no acute finding. Review of the MIP  images confirms the above findings CTA HEAD FINDINGS Anterior circulation: Petrous and cavernous segments patent bilaterally. Atheromatous irregularity within the supraclinoid ICAs without hemodynamically significant stenosis. A1 segments patent bilaterally. Normal anterior communicating artery complex. Anterior cerebral arteries patent to their distal aspects without high-grade stenosis. Left M1 segment widely patent. Short-segment moderate stenosis noted at the mid right M1 segment (series 15, image 64). Normal left  MCA bifurcation. Right MCA trifurcations. Moderate atheromatous irregularity throughout the MCA branches distally. No proximal M2 branch occlusion. Posterior circulation: Right vertebral artery patent as it courses into the cranial vault. Right PICA origin patent and perfused. Right V4 segment essentially occludes just beyond the takeoff of the right PICA. Left vertebral artery essentially occludes at the cranial vault, with only scant thready flow seen distally. Left PICA not visualized, likely occluded. Basilar artery is diffusely diminutive with markedly irregular and scant thready flow. Perfusion of the basilar tip via collateralization from the anterior circulation. Superior cerebral arteries are perfused bilaterally. Predominant fetal type origin of the PCAs. PCAs are irregular but remain patent to their distal aspects without high-grade stenosis. Venous sinuses: Patent allowing for timing of the contrast bolus. Anatomic variants: Predominant fetal type origin of the PCAs with overall diminutive vertebrobasilar system. Review of the MIP images confirms the above findings IMPRESSION: CT HEAD IMPRESSION: 1. Continued interval evolution of scattered left PICA and posterior fossa infarcts, grossly stable from previous MRI. No evidence for hemorrhagic transformation or significant regional mass effect. 2. No other new acute intracranial abnormality. 3. Underlying atrophy with chronic small vessel  ischemic disease. CTA HEAD AND NECK IMPRESSION: 1. 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 The left PICA is not visualized, likely occluded. Both PCAs and SCAs remain perfused via the anterior circulation. 2. Short-segment moderate stenosis involving the mid right M1 segment. 3. Wide patency of the major arterial vasculature in the neck. Results were discussed by telephone at the time of interpretation on 01/03/2020 at 4:40 am with provider [facility]. Electronically Signed   By: Rise Mu M.D.   On: 01/03/2020 04:49   CT ANGIO NECK W OR WO CONTRAST  Result Date: 01/03/2020 CLINICAL DATA:  Initial evaluation for acute ataxia, follow-up exam for stroke. EXAM: CT ANGIOGRAPHY HEAD AND NECK TECHNIQUE: Multidetector CT imaging of the head and neck was performed using the standard protocol during bolus administration of intravenous contrast. Multiplanar CT image reconstructions and MIPs were obtained to evaluate the vascular anatomy. Carotid stenosis measurements (when applicable) are obtained utilizing NASCET criteria, using the distal internal carotid diameter as the denominator. CONTRAST:  58mL OMNIPAQUE IOHEXOL 350 MG/ML SOLN COMPARISON:  Prior brain MRI from 01/02/2020. FINDINGS: CT HEAD FINDINGS Brain: Age-related cerebral atrophy with moderate chronic microvascular ischemic disease. Remote lacunar infarcts noted at the left basal ganglia/corona radiata. Evolving cytotoxic edema involving the inferior left cerebellum consistent with previously identified left PICA territory infarcts. Additional previously described ischemic change about the left middle cerebellar peduncle and medulla not well visualized by CT. No evidence for hemorrhagic transformation or  significant regional mass effect. No other acute large vessel territory infarct. No mass lesion, midline shift or mass effect. No hydrocephalus or extra-axial fluid collection. Vascular: No hyperdense vessel. Scattered vascular calcifications noted within the carotid siphons. Skull: Scalp soft tissues and calvarium within normal limits. Sinuses: Paranasal sinuses and mastoid air cells are clear. Orbits: Globes and orbital soft tissues within normal limits. CTA NECK FINDINGS Aortic arch: Visualized aortic arch of normal caliber with normal 3 vessel morphology. No hemodynamically significant stenosis about the origin of the great vessels. Right carotid system: Right common and internal carotid arteries widely patent without stenosis, dissection or occlusion. Left carotid system: Left common and internal carotid arteries widely  patent without stenosis, dissection or occlusion. Vertebral arteries: Both vertebral arteries arise from the subclavian arteries. No proximal subclavian artery stenosis. Vertebral arteries are diffusely hypoplastic bilaterally but remain patent within the neck without stenosis, dissection or occlusion. Skeleton: No acute osseous finding. No discrete or worrisome osseous lesions. Moderate cervical spondylosis noted at C5-6 and C6-7. Poor dentition noted. Other neck: No other acute soft tissue abnormality within the neck. No mass or adenopathy. Upper chest: Visualized upper chest demonstrates no acute finding. Review of the MIP images confirms the above findings CTA HEAD FINDINGS Anterior circulation: Petrous and cavernous segments patent bilaterally. Atheromatous irregularity within the supraclinoid ICAs without hemodynamically significant stenosis. A1 segments patent bilaterally. Normal anterior communicating artery complex. Anterior cerebral arteries patent to their distal aspects without high-grade stenosis. Left M1 segment widely patent. Short-segment moderate stenosis noted at the mid right  M1 segment (series 15, image 64). Normal left MCA bifurcation. Right MCA trifurcations. Moderate atheromatous irregularity throughout the MCA branches distally. No proximal M2 branch occlusion. Posterior circulation: Right vertebral artery patent as it courses into the cranial vault. Right PICA origin patent and perfused. Right V4 segment essentially occludes just beyond the takeoff of the right PICA. Left vertebral artery essentially occludes at the cranial vault, with only scant thready flow seen distally. Left PICA not visualized, likely occluded. Basilar artery is diffusely diminutive with markedly irregular and scant thready flow. Perfusion of the basilar tip via collateralization from the anterior circulation. Superior cerebral arteries are perfused bilaterally. Predominant fetal type origin of the PCAs. PCAs are irregular but remain patent to their distal aspects without high-grade stenosis. Venous sinuses: Patent allowing for timing of the contrast bolus. Anatomic variants: Predominant fetal type origin of the PCAs with overall diminutive vertebrobasilar system. Review of the MIP images confirms the above findings IMPRESSION: CT HEAD IMPRESSION: 1. Continued interval evolution of scattered left PICA and posterior fossa infarcts, grossly stable from previous MRI. No evidence for hemorrhagic transformation or significant regional mass effect. 2. No other new acute intracranial abnormality. 3. Underlying atrophy with chronic small vessel ischemic disease. CTA HEAD AND NECK IMPRESSION: 1. 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 The left PICA is not visualized, likely occluded. Both PCAs and SCAs remain perfused via the anterior circulation. 2. Short-segment moderate stenosis  involving the mid right M1 segment. 3. Wide patency of the major arterial vasculature in the neck. Results were discussed by telephone at the time of interpretation on 01/03/2020 at 4:40 am with provider [facility]. Electronically Signed   By: Jeannine Boga M.D.   On: 01/03/2020 04:49   MR BRAIN W WO CONTRAST  Addendum Date: 01/03/2020   ADDENDUM REPORT: 01/03/2020 06:12 ADDENDUM: Results communicated by telephone to nurse practitioner Rufina Falco at 1:20 a.m. on 01/02/2020. Electronically Signed   By: Jeannine Boga M.D.   On: 01/03/2020 06:12   Result Date: 01/03/2020 CLINICAL DATA:  Initial evaluation for bacterial meningitis. Evaluate for resolution of previously seen subtle diffusion abnormality from prior exam. EXAM: MRI HEAD WITHOUT AND WITH CONTRAST TECHNIQUE: Multiplanar, multiecho pulse sequences of the brain and surrounding structures were obtained without and with intravenous contrast. CONTRAST:  12m GADAVIST GADOBUTROL 1 MMOL/ML IV SOLN COMPARISON:  Prior MRI from 01/01/2020. FINDINGS: Brain: Examination mildly degraded by motion artifact, particularly the postcontrast sequences. Generalized age-related  cerebral atrophy. Patchy and confluent T2/FLAIR hyperintensity within the periventricular and deep white matter both cerebral hemispheres most consistent with chronic small vessel ischemic disease, moderate in nature. Few small remote lacunar infarcts noted at the left basal ganglia/corona radiata. Previously identified diffusion abnormality involving the left cerebellum again seen, markedly more intense on today's exam as compared to previous (series 6, image 11). Scattered patchy involvement of the left middle cerebellar peduncle and ventral medulla (series 6, images 15, 13). Associated mildly decreased signal on corresponding ADC map, with prominent T2/FLAIR hyperintensity within the areas affected. Following contrast administration, there is patchy and somewhat linear  postcontrast enhancement throughout the areas involved (series 22, images 8, 9). Given appearance and distribution, findings felt to be most consistent with acute to subacute ischemic infarcts, predominantly left PICA territory. Suspected faint petechial hemorrhage at the inferior left cerebellum without hemorrhagic transformation (series 13, image 14). No other new diffusion abnormality to suggest acute or subacute ischemia seen elsewhere within the brain. Gray-white matter differentiation otherwise maintained. No other areas of chronic cortical infarction. No other foci of susceptibility artifact to suggest acute or chronic intracranial hemorrhage. No mass lesion, midline shift or mass effect. No hydrocephalus or extra-axial fluid collection. Pituitary gland and suprasellar region within normal limits. Midline structures intact. No other abnormal enhancement to suggest meningitis or other intracranial infection. No intrinsic temporal lobe abnormality. Vascular: Intracranial flow voids within the bilateral V4 segments and basilar artery are not well seen, and could be partially occluded given the posterior circulation infarcts (series 10, images 5, 8). There is suspected predominant fetal type origin of the PCAs. Major intracranial vascular flow voids are otherwise maintained. Skull and upper cervical spine: Craniocervical junction within normal limits. Bone marrow signal intensity normal. No scalp soft tissue abnormality. Sinuses/Orbits: Globes and orbital soft tissues within normal limits. Mild mucosal thickening noted within the ethmoidal air cells and maxillary sinuses. Paranasal sinuses are otherwise clear. No mastoid effusion. Inner ear structures grossly normal. Other: None. IMPRESSION: 1. Persistent and increased prominence of previously identified left cerebellar diffusion abnormality, with additional patchy involvement of the left middle cerebellar peduncle and medulla. Given appearance and distribution  on today's exam, findings felt to be most consistent with acute to subacute ischemic infarcts, predominantly left PICA territory. Suspected faint petechial hemorrhage at the inferior left cerebellum without hemorrhagic transformation. 2. Poorly visualized and potentially absent flow voids within the distal V4 segments and basilar artery, which could be occluded given the posterior circulation infarcts. Correlation with dedicated CTA suggested. 3. No other acute intracranial abnormality. No other abnormal enhancement to suggest meningitis or other intracranial infection. No other findings of acute seizure. 4. Underlying age-related cerebral atrophy with moderate chronic microvascular ischemic disease. Current attempt is being made to contact the covering physician regarding these findings, results will be communicated as soon as possible. Electronically Signed: By: Jeannine Boga M.D. On: 01/03/2020 00:41    Scheduled Meds: . aspirin  81 mg Oral Daily  . atorvastatin  80 mg Oral q1800  . clopidogrel  75 mg Oral Daily  . cromolyn  2 drop Left Eye QID  . enoxaparin (LOVENOX) injection  40 mg Subcutaneous Q24H  . erythromycin   Left Eye Once  . fluticasone  2 spray Each Nare Daily  . loratadine  10 mg Oral Daily  . tamsulosin  0.4 mg Oral QPC supper   Continuous Infusions: . sodium chloride 75 mL/hr at 01/02/20 1652  . azithromycin Stopped (01/02/20 1759)  LOS: 3 days   Time spent: 35 minutes.  Desma Maxim, MD [facility]  If 7PM-7AM, please contact night-coverage [email or web address]  01/03/2020, 11:03 AM

## 2020-01-03 NOTE — Progress Notes (Signed)
    BRIEF OVERNIGHT PROGRESS REPORT    SUBJECTIVE: Notified by [facility] Radiologist of abnormal MRI of the brain showing acute to subacute ischemic infarcts involving the left PICA territory with faint petechial hemorrhage at the inferior left cerebellum without hemorrhagic transformation.  OBJECTIVE: On bedside assessment, he was afebrile with blood pressure 129/78 mm Hg and pulse rate 81 beats/min. Neuro assessment as below.  Interval: Other (Comment) (12/09 0500) Level of Consciousness (1a.)   : Not alert, but arousable by minor stimulation to obey, answer, or respond (12/09 0500) LOC Questions (1b. )   +: Answers neither question correctly (12/09 0500) LOC Commands (1c. )   + : Performs both tasks correctly (12/09 0500) Best Gaze (2. )  +: Partial gaze palsy (12/09 0500) Visual (3. )  +: Partial hemianopia (12/09 0500) Facial Palsy (4. )    : Minor paralysis (12/09 0500) Motor Arm, Left (5a. )   +: Some effort against gravity (12/09 0500) Motor Arm, Right (5b. )   +: No effort against gravity (12/09 0500) Motor Leg, Left (6a. )   +: Some effort against gravity (12/09 0500) Motor Leg, Right (6b. )   +: No movement (12/09 0500) Limb Ataxia (7. ): Absent (12/09 0500) Sensory (8. )   +: Mild-to-moderate sensory loss, patient feels pinprick is less sharp or is dull on the affected side, or there is a loss of superficial pain with pinprick, but patient is aware of being touched (12/09 0500) Best Language (9. )   +: Severe aphasia (12/09 0500) Dysarthria (10. 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 Initial concerns for seizures however repeat MRI brain, with findings as above consistent with left cerebellar strokes in a patient with recent hx of stroke with left residual deficit on the left.  -CTA head and neck reviewed and shows diminutive and severely diseased vertebrobasilar system. 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 -Check HgbA1c, fasting lipid panel -PT consult, OT consult, Speech consult -Echocardiogram -Patient was ASA 81mg  PO daily prior to this event.  Will add Plavix 75 mg PO daily -NPO until RN stroke swallow screen -Telemetry monitoring -Frequent neuro checks -Discussed with on call Neurologist at [facility] who recommends transfer to [facility] for further management and monitoring. Patient at high risk for decompensation with imminent death would also consider higher level of care as appropriate.  Currently no beds at [facility] on wait list.   Family communication: Discussed findings and plan of care with patient's sister Tod, Abrahamsen who is in agreement with transfer.     Johny Shock, BSN, MSN, DNP, [facility]  [facility] Nurse Practitioner  [facility]

## 2020-01-03 NOTE — Progress Notes (Signed)
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 He was noted to have an right eye deviation with twitching eye movements concerning for a possible seizure which prompted an MRI which demosntrated a L Cerebellar PICA stroke which was not as prominent. Repeat MRI Brain demonstrated more obvious DWI changes consistent with a L PICA stroke and associated evolving changes.   Interval Hx   He had CT angio head and neck overnight which shows BL fetal PCAs. He has near completed occlusion of the vertebrobasilar system with scant flow through Bilateral V4 segments of the Verts and through the basilar artery with L PICA occlusion.  This was discussed with overnight Neurohospitalist at [facility] and was recommended transfer for closer monitoring.  On my evaluation today, he is sleeping. He wakes up, follows commands, answers questions and participates in NIHSS but goes back to sleep.  Vitals   Vitals:   01/03/20 0101 01/03/20 0535 01/03/20 0650 01/03/20 0752  BP: 117/71 129/78 120/67 135/84  Pulse: 87 81 80 83  Resp: 18 16 16 16   Temp: 98 F (36.7 C) 97.9 F (36.6 C) 98 F (36.7 C) 98 F (36.7 C)  TempSrc:  Oral  Oral  SpO2: 97% 97% 97% (!) 86%  Weight:      Height:         Body mass index is 22.37 kg/m.  Physical Exam   General: Laying comfortably in bed; in no acute distress. HENT: Normal oropharynx and mucosa. Normal external appearance of ears and nose. Neck: Supple, no pain or tenderness CV: No JVD. No peripheral edema. Pulmonary: Symmetric Chest rise. Normal respiratory effort. Abdomen: Soft to touch,  non-tender. Ext: No cyanosis, edema, or deformity Skin: No rash. Normal palpation of skin.  Musculoskeletal: Normal digits and nails by inspection. No clubbing.  Neurologic Examination  Mental status/Cognition: Somnolent but awakens up from sleep with voice, oriented to self, place, month. Poor attention. Speech/language: Dysarthric speech, fluent, comprehension intact, object naming intact, repetition intact. Cranial nerves:   CN II Pupils equal and reactive to light, no VF deficits   CN III,IV,VI EOM intact, no gaze preference or deviation, no nystagmus   CN V normal sensation in V1, V2, and V3 segments bilaterally   CN VII no asymmetry, no nasolabial fold flattening   CN VIII normal hearing to speech   CN IX & X    CN XI    CN XII midline tongue protrusion   Motor:  Muscle bulk: poor, tone normal, pronator drift none tremor none Mvmt Root Nerve  Muscle Right Left Comments  SA C5/6 Ax Deltoid 5 5   EF C5/6 Mc Biceps 5 5   EE C6/7/8 Rad Triceps 5 5   WF C6/7 Med FCR 5 5   WE C7/8 PIN ECU 5 5   F Ab C8/T1 U ADM/FDI 5 5   HF L1/2/3 Fem Illopsoas 5 5   KE L2/3/4 Fem Quad     DF L4/5 D Peron Tib Ant 5 5   PF S1/2 Tibial Grc/Sol 5 5    Reflexes:  Right Left Comments  Pectoralis      Biceps (C5/6) 1 1   Brachioradialis (C5/6) 1  1    Triceps (C6/7) 1 1    Patellar (L3/4) 1 1    Achilles (S1)      Hoffman      Plantar     Jaw jerk    Sensation:  Light touch Intact in all extremities and face.   Pin prick    Temperature    Vibration   Proprioception    Coordination/Complex Motor:  - Finger to Nose with mild ataxia in LUE - Heel to shin unable to get him to do. - Rapid alternating movement are slowed bilaterally   NIHSS components Score: Comment  1a Level of Conscious 0[]  1[x]  2[]  3[]    Wakes up to participate in the exam thou  1b LOC Questions 0[x]  1[]  2[]       1c LOC Commands 0[x]  1[]  2[]       2 Best Gaze 0[x]  1[]  2[]       3 Visual 0[x]  1[]  2[]  3[]      4 Facial  Palsy 0[x]  1[]  2[]  3[]      5a Motor Arm - left 0[x]  1[]  2[]  3[]  4[]  UN[]    5b Motor Arm - Right 0[x]  1[]  2[]  3[]  4[]  UN[]    6a Motor Leg - Left 0[x]  1[]  2[]  3[]  4[]  UN[]    6b Motor Leg - Right 0[x]  1[]  2[]  3[]  4[]  UN[]    7 Limb Ataxia 0[]  1[x]  2[]  3[]  UN[]     8 Sensory 0[x]  1[]  2[]  UN[]      9 Best Language 0[x]  1[]  2[]  3[]      10 Dysarthria 0[]  1[]  2[x]  UN[]      11 Extinct. and Inattention 0[x]  1[]  2[]       TOTAL: 4     Labs   Basic Metabolic Panel:  Lab Results  Component Value Date   NA 138 01/03/2020   K 3.4 (L) 01/03/2020   CO2 22 01/03/2020   GLUCOSE 98 01/03/2020   BUN 12 01/03/2020   CREATININE 1.05 01/03/2020   CALCIUM 8.5 (L) 01/03/2020   GFRNONAA >60 01/03/2020   HbA1c: No results found for: HGBA1C LDL:  Lab Results  Component Value Date   LDLCALC 42 01/03/2020   Urine Drug Screen: No results found for: LABOPIA, COCAINSCRNUR, LABBENZ, AMPHETMU, THCU, LABBARB  Alcohol Level No results found for: ETH No results found for: PHENYTOIN, ZONISAMIDE, LAMOTRIGINE, LEVETIRACETA No results found for: PHENYTOIN, PHENOBARB, VALPROATE, CBMZ  Imaging and Diagnostic studies   MRI Brain without contrast: 1. Persistent and increased prominence of previously identified left cerebellar diffusion abnormality, with additional patchy involvement of the left middle cerebellar peduncle and medulla. Given appearance and distribution on today's exam, findings felt to be most consistent with acute to subacute ischemic infarcts, predominantly left PICA territory. Suspected faint petechial hemorrhage at the inferior left cerebellum without hemorrhagic transformation. 2. Poorly visualized and potentially absent flow voids within the distal V4 segments and basilar artery, which could be occluded given the posterior circulation infarcts. Correlation with dedicated CTA suggested. 3. No other acute intracranial abnormality. No other abnormal enhancement to suggest meningitis or other  intracranial infection. No other findings of acute seizure. 4. Underlying age-related cerebral atrophy with moderate chronic microvascular ischemic disease.  CT Angio head and neck: 1. 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 The left PICA is not visualized, likely occluded. Both PCAs and SCAs remain  perfused via the anterior circulation. 2. Short-segment moderate stenosis involving the mid right M1 segment. 3. Wide patency of the major arterial vasculature in the neck.  Impression   Lucien Budney is a 69 y.o. 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 MRI with L Cerebellar PICA stroke, CTA with severely diseased vertebrobasilar system with scant flow throgh BL intracranial vertebral arteries and basilar artery with L PICA occlusion. He has BL fetal PCAs. Discussed findings with the stroke team at [facility] and agree that he appears to have chronically diminished posterior circulation and probably has compensated for that with colaterals. As such, he is unlikely to benefit from any acute intervention at this time. His NIHSS is a 4 for dysarthria, mild LUE ataxia and somnolence but he wakes up easily. We will keep a close eye on him in the Progressive Care Unit and if there is any worsening exam or mentation, we should get a repeat CT Angio and discuss potential emergent transfer at that time. It is unlikely that he will get to that point given that he is already 3 days out from the noted stroke.  We will keep other measures in place including permissive hypertension,  bedrest with head of bed flat.  Recommendations  - I ordered Q2H Neuro checks for 24 hours then Q4Hours. - Agree with obtaining TTE - Agree with Lipid panel with LDL - continue home Atorvastastin 80mg  daily - HbA1c of 5.9. - Antithrombotic  - I ordered Plavix load of 300mg  once, continue Plavix 75mg  daily x 3 months, then stop.  - continue Aspirin 81mg  daily - Recommend DVT ppx - SBP goal - permissive hypertension first 24 h < 220/110. Held home meds.  - Recommend Telemetry monitoring for arrythmia - Recommend bedside swallow screen prior to PO intake. - Stroke education booklet - I discontinued PT,OT consult at this time and ordered strict bedrest with head of bed flat. - I increased maintenance IV Fluids to 151ml/Hr - Recommend monitoring in Progressive Care Unit. - Please obtain CT Head without contrast and CT angio head and neck for worsening mentation, headache, nausea, vomiting, pupillary abnormality, change in NIHSS of 3 or more. If there is complete occlusion of the basilar, please contact neurologist on call immediately.  ______________________________________________________________________   Thank you for the opportunity to take part in the care of this patient. If you have any further questions, please contact the neurology consultation attending.  Signed,  [facility] Pager Number 

## 2020-01-03 NOTE — Progress Notes (Signed)
PT Cancellation Note  Patient Details Name: Alexander Byrd MRN: [id number] DOB: 10-Apr-1950   Cancelled Treatment:    Reason Eval/Treat Not Completed: Medical issues which prohibited therapy Spoke with nurse who reports that pt is still having evolving stroke and will be transferring to CCU.  Will hold PT and complete orders, he will need new PT orders when stable and appropriate for PT.   Malachi Pro, DPT 01/03/2020, 11:14 AM

## 2020-01-03 NOTE — Plan of Care (Signed)
  Problem: Education: Goal: Knowledge of General Education information will improve Description Including pain rating scale, medication(s)/side effects and non-pharmacologic comfort measures Outcome: Progressing   

## 2020-01-03 NOTE — TOC Initial Note (Signed)
Transition of Care [facility]) - Initial/Assessment Note    Patient Details  Name: Alexander Byrd MRN: [id number] Date of Birth: 1950/09/10  Transition [facility] CM/SW Contact:    Allayne Butcher, RN Phone Number: 01/03/2020, 10:14 AM  Clinical Narrative:                 Patient admitted to the hospital with altered mental status and sepsis.  Patient has a history of recent CVA hospitalized at [facility].  Patient has been at [facility] since discharge from [facility].  [name] is the patient's sister and RNCM was able to speak with her via phone.  [name] would like to take the patient home at discharge, she does not want patient to go back to [facility].  Patient is from home where he lives with his girlfriend, when he goes home he will have round the clock care and home health services can be arranged.  List of home health agencies provided to patient's sister for review.  Patient will need a hospital bed he already has a walker and ramp at home.  Patient may need EMS for transport depending on progress during hospitalization with physical therapy.  Patient is current with his PCP at [facility] in [place].   TOC will cont to follow and assist with discharge.   Expected Discharge Plan: Home w Home Health Services Barriers to Discharge: Continued Medical Work up   Patient Goals and CMS Choice Patient states their goals for this hospitalization and ongoing recovery are:: Patient's sister [name] would like to take the patient home and care for him there CMS Medicare.gov Compare Post Acute Care list provided to:: Patient Represenative (must comment) Choice offered to / list presented to : Sibling  Expected Discharge Plan and Services Expected Discharge Plan: Home w Home Health Services   Discharge Planning Services: CM Consult Post Acute Care Choice: Durable Medical Equipment,Home Health Living arrangements for the past 2 months: Single Family Home                 DME Arranged:  Hospital bed,3-N-1 DME Agency: [employer]       HH Arranged: RN,PT,OT,Nurse's Aide          Prior Living Arrangements/Services Living arrangements for the past 2 months: Single Family Home Lives with:: Significant Other Patient language and need for interpreter reviewed:: Yes Do you feel safe going back to the place where you live?: Yes      Need for Family Participation in Patient Care: Yes (Comment) (stroke) Care giver support system in place?: Yes (comment) (sisters) Current home services: DME (rolling walker) Criminal Activity/Legal Involvement Pertinent to Current Situation/Hospitalization: No - Comment as needed  Activities of Daily Living Home Assistive Devices/Equipment: None ADL Screening (condition at time of admission) Patient's cognitive ability adequate to safely complete daily activities?: Yes Is the patient deaf or have difficulty hearing?: No Does the patient have difficulty seeing, even when wearing glasses/contacts?: No Does the patient have difficulty concentrating, remembering, or making decisions?: Yes Patient able to express need for assistance with ADLs?: No Does the patient have difficulty dressing or bathing?: Yes Independently performs ADLs?: Yes (appropriate for developmental age) Does the patient have difficulty walking or climbing stairs?: Yes Weakness of Legs: Left Weakness of Arms/Hands: Left  Permission Sought/Granted Permission sought to share information with : Case Manager,Facility Contact Representative,Family Supports Permission granted to share information with : Yes, Verbal Permission Granted  Share Information with NAME: [name]  Permission granted to share  info w AGENCY: home health agency of choice  Permission granted to share info w Relationship: sister     Emotional Assessment Appearance:: Appears stated age     Orientation: : Oriented to Self Alcohol / Substance Use: Not Applicable Psych Involvement: No  (comment)  Admission diagnosis:  Dehydration [E86.0] Enteritis [K52.9] Lactic acid acidosis [E87.2] Sepsis (HCC) [A41.9] Acute bacterial conjunctivitis of left eye [H10.32] Altered mental status, unspecified altered mental status type [R41.82] Sepsis, due to unspecified organism, unspecified whether acute organ dysfunction present [facility]) [A41.9] Patient Active Problem List   Diagnosis Date Noted  . Altered mental status   . Enteritis   . Lactic acid acidosis   . Dehydration   . Sepsis (HCC) 12/31/2019   PCP:  Collier Bullock., MD Pharmacy:  No Pharmacies Listed    Social Determinants of Health (SDOH) Interventions    Readmission Risk Interventions No flowsheet data found.

## 2020-01-03 NOTE — Progress Notes (Incomplete)
PT is more lucid, continues to have delayed mumbled responses with improvement noted. I was able to understand 1 complete question after having pt repeat himself at a slower pace. Pt asked " are you taking these lines out or are they gonna do it?"  Sister Bea at bedside. Pt asking to go home. Nodded understanding that he could not at this time. Pleasant and cooperative follows commands.

## 2020-01-03 NOTE — Progress Notes (Signed)
Shift note: Pt transferred to ICU 18 at 11:30am. Pt was alert, lethargic. opened eyes briefly when spoken to then back to sleep.  Mildly responsive, with delay and mumbling noted. Unable to be understood. Able to move all 4 extremities, followed commands as able. NPO at this time.

## 2020-01-04 ENCOUNTER — Inpatient Hospital Stay (HOSPITAL_COMMUNITY)
Admit: 2020-01-04 | Discharge: 2020-01-04 | Disposition: A | Discharge status: Home / Self Care | Payer: Medicare Other | Attending: Nurse Practitioner | Admitting: Nurse Practitioner

## 2020-01-04 DIAGNOSIS — I6389 Other cerebral infarction: Secondary | ICD-10-CM

## 2020-01-04 DIAGNOSIS — I639 Cerebral infarction, unspecified: Secondary | ICD-10-CM

## 2020-01-04 LAB — BASIC METABOLIC PANEL
Anion gap: 14 (ref 5–15)
BUN: 10 mg/dL (ref 8–23)
CO2: 17 mmol/L — ABNORMAL LOW (ref 22–32)
Calcium: 8.8 mg/dL — ABNORMAL LOW (ref 8.9–10.3)
Chloride: 106 mmol/L (ref 98–111)
Creatinine, Ser: 0.78 mg/dL (ref 0.61–1.24)
GFR, Estimated: >60 mL/min (ref >60)
Glucose, Bld: 72 mg/dL (ref 70–99)
Potassium: 3.4 mmol/L — ABNORMAL LOW (ref 3.5–5.1)
Sodium: 137 mmol/L (ref 135–145)

## 2020-01-04 LAB — ECHOCARDIOGRAM COMPLETE
AR max vel: 3.24 cm2
AV Area VTI: 3.08 cm2
AV Area mean vel: 3.35 cm2
AV Mean grad: 3 mmHg
AV Peak grad: 4.7 mmHg
Ao pk vel: 1.08 m/s
Area-P 1/2: 8.92 cm2
Height: 72 in
S' Lateral: 3.48 cm
Weight: 2564.39 oz

## 2020-01-04 MED ORDER — PERFLUTREN LIPID MICROSPHERE
1.0000 mL | INTRAVENOUS | Status: AC | PRN
  Administered 2020-01-04: 2 mL via INTRAVENOUS
  Filled 2020-01-04: qty 10

## 2020-01-04 MED ORDER — INFLUENZA VAC A&B SA ADJ QUAD 0.5 ML IM PRSY
0.5000 mL | PREFILLED_SYRINGE | INTRAMUSCULAR | Status: DC
  Filled 2020-01-04: qty 0.5

## 2020-01-04 NOTE — Progress Notes (Signed)
SLP Cancellation Note  Patient Details Name: Alexander Byrd MRN: [id number] DOB: 1950/06/22   Cancelled treatment:       Reason Eval/Treat Not Completed: Patient not medically ready;Medical issues which prohibited therapy (chart reviewed; consulted NSG). Pt has been transferred to CCU w/ orders for [facility] [facility] [employer] to be flat still today per Neurologist. Request by Neurologist is for ST services to f/u tomorrow w/ BSE, per NSG.  Recommend frequent oral care for hygiene and stimulation of swallowing; aspiration precautions. NSG agreed.     Jerilynn Som, MS, CCC-SLP Speech Language Pathologist Rehab Services [phone] [facility] 01/04/2020, 1:09 PM

## 2020-01-04 NOTE — Progress Notes (Addendum)
PROGRESS NOTE    Alexander Byrd  DUK:[id number] DOB: November 24, 1950 DOA: 12/31/2019 PCP: Jennette Bill., MD   Brief Narrative: Taken from H&P. Alexander Byrd is a 69 y.o. 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  Initially met severe sepsis criteria with tachypnea, tachycardia, lactic acidosis with lactic acid peaked at 6, and encephalopathy received IV fluid and broad-spectrum antibiotics.  GI pathogen with pathogenic E. Coli, blood and urine cultures pending. Apparently blood cultures were drawn after starting antibiotics. CT head without any acute changes but MRI brain with some questionable increased diffusion abnormality involving pons, left middle cerebellar peduncle and left cerebellar hemisphere, concern for seizures/encephalitis.  There is 1 nursing concern when patient with persistent right gaze and rapid nystagmus concerning for seizure and he was started on Keppra.  Neurology was consulted. CT abdomen with concern of enteritis. Speech evaluated him and recommending dysphagia 2 diet. AM of 12/8 abnormal MRI showing subacute cerebellar stroke, transferred to ICU for close monitoring, neuro advising no interventions  Subjective: Patient was resting comfortably when seen this morning.  Responds to simple questions. Moving all 4 extemities.   Assessment & Plan:   Active Problems:   Sepsis ([place])  CVA Ischemic left cerebellar stroke 11/21 hospitalized at [facility], since then dysphagia and left sided weakness. Repeat MRI performed 12/9 with L cerebellar PICA stroke. Seen by neurology, out of window for fibrinolytics, discussed w/ stroke team at [facility] who do not think needs acute intervention at this time. Stable neuro exam - neuro following closely,  appreciate their recis - f/u TTE, read pending - transfer to progressive bed, q4 neuro checks - Continue with aspirin and Lipitor - cont tele - plavix loaded, will need to continue 75 daily for 3 months - bed rest  - permissive htn, holding amlodipine - IVF @ 100 - slp swallow eval ordered, they will see again today - ct head w/o contrast and ct angio head/neck for indications outlined in neuro consult note  Severe sepsis with acute enteritis.  POA. GI pathogen with epec. c diff neg. Lactic acid has normalized. Ct showing enteritis. Urine culture ngtd. Blood cultures also ngtd. Is s/p cefepime/flagyl. Hemodynamically stable. Stool output has decreased.  - s/p 3 days azithromycin - Continue with gentle hydration   Encephalopathy. POA Likely 2/2 series of CVAs, acute infection likely contributory. Stable - monitor  AKI.  Resolved. Creatinine recent [facility] hospitalization trended around 1.1. PO remains poor - Continue to monitor renal function. - Avoid nephrotoxins. - continue IVF  Partial urinary retention.  CT abdomen with prostatomegaly and bladder scan with more than 400 cc requiring one time in and out catheter.  There is some concern of urinary retention.  That can also contribute to AKI.  Apparently multiple soiled diapers, so most likely partial retention or incomplete emptying of bladder. Does not cooperate for post-void residual scan. - started on flomax - monitor  Essential hypertension.  Blood pressure wnl today - hold amlodipine and lisinopril, plan for permissive htn.  Hyperlipidemia. -Continue with statin  Objective: Vitals:   01/04/20 0700 01/04/20 0800 01/04/20 0900 01/04/20 1000  BP: 136/90 (!) 144/88 (!) 176/95 (!) 148/91  Pulse:  90  90  Resp: 15 17 (!) 25 19  Temp:  98 F (36.7  C)    TempSrc:  Oral    SpO2:    99%  Weight:      Height:        Intake/Output Summary (Last 24 hours) at 01/04/2020 1054 Last data filed at 01/04/2020 0903 Gross per 24 hour   Intake 3078.84 ml  Output 1025 ml  Net 2053.84 ml   Filed Weights   12/31/19 2008 12/31/19 2231 01/03/20 1129  Weight: 74.8 kg 74.8 kg 72.7 kg    Examination:  General exam: Appears calm and comfortable  Respiratory system: Clear to auscultation. Respiratory effort normal. Cardiovascular system: S1 & S2 heard, RRR. Gastrointestinal system: Soft, nontender, nondistended, bowel sounds positive. Central nervous system: moves all 4 extremities Extremities: No edema, no cyanosis, pulses intact and symmetrical. Psychiatry: unable to assess   DVT prophylaxis: Lovenox Code Status: Full Family Communication: sister updated telephonically 12/10 Disposition Plan:  Status is: Inpatient  Remains inpatient appropriate because:Inpatient level of care appropriate due to severity of illness   Dispo: The patient is from: SNF              Anticipated d/c is to: pt/ot consult on hold, per nursing family says they want home with home health              Anticipated d/c date is: 2-3 days              Patient currently is not medically stable to d/c.  Consultants:   Neurology  Procedures:  Antimicrobials:  S/p cefepime/flagyl Azithromycin 12/7>12/9  Data Reviewed: I have personally reviewed following labs and imaging studies  CBC: Recent Labs  Lab 12/31/19 2015 01/01/20 0609 01/02/20 0506  WBC 10.5 7.9 4.9  HGB 14.6 12.6* 12.6*  HCT 42.5 36.6* 36.3*  MCV 89.1 88.4 89.0  PLT 382 254 626   Basic Metabolic Panel: Recent Labs  Lab 12/31/19 2015 01/01/20 0609 01/02/20 0506 01/03/20 0521 01/04/20 0646  NA 139 137 137 138 137  K 3.7 3.6 3.7 3.4* 3.4*  CL 108 110 110 108 106  CO2 19* 18* 20* 22 17*  GLUCOSE 169* 126* 94 98 72  BUN _0 CREATININE 1.48* 1.31* 1.19 1.05 0.78  CALCIUM 9.6 8.9 8.8* 8.5* 8.8*   GFR: Estimated Creatinine Clearance: 88.4 mL/min (by C-G formula based on SCr of 0.78 mg/dL). Liver Function Tests: Recent Labs  Lab 12/31/19 2015  01/01/20 0609  AST 22 21  ALT 19 16  ALKPHOS 98 82  BILITOT 1.0 1.0  PROT 7.9 6.5  ALBUMIN 3.7 3.1*   Recent Labs  Lab 12/31/19 2015  LIPASE 33   Recent Labs  Lab 12/31/19 2059  AMMONIA 13   Coagulation Profile: Recent Labs  Lab 12/31/19 2048  INR 1.1   Cardiac Enzymes: No results for input(s): CKTOTAL, CKMB, CKMBINDEX, TROPONINI in the last 168 hours. BNP (last 3 results) No results for input(s): PROBNP in the last 8760 hours. HbA1C: Recent Labs    01/03/20 0521  HGBA1C 5.9*   CBG: Recent Labs  Lab 01/03/20 1127 01/03/20 2006  GLUCAP 87 81   Lipid Profile: Recent Labs    01/03/20 0521  CHOL 84  HDL 28*  LDLCALC 42  TRIG 68  CHOLHDL 3.0   Thyroid Function Tests: No results for input(s): TSH, T4TOTAL, FREET4, T3FREE, THYROIDAB in the last 72 hours. Anemia Panel: No results for input(s): VITAMINB12, FOLATE, FERRITIN, TIBC, IRON, RETICCTPCT in the last 72 hours. Sepsis Labs: Recent Labs  Lab  12/31/19 2353 01/01/20 0317 01/01/20 0609 01/02/20 0506  LATICACIDVEN 6.0* 4.5* 2.6* 1.0    Recent Results (from the past 240 hour(s))  Resp Panel by RT-PCR (Flu A&B, Covid) Nasopharyngeal Swab     Status: None   Collection Time: 12/31/19  8:48 PM   Specimen: Nasopharyngeal Swab; Nasopharyngeal(NP) swabs in vial transport medium  Result Value Ref Range Status   SARS Coronavirus 2 by RT PCR NEGATIVE NEGATIVE Final    Comment: (NOTE) SARS-CoV-2 target nucleic acids are NOT DETECTED.  The SARS-CoV-2 RNA is generally detectable in upper respiratory specimens during the acute phase of infection. The lowest concentration of SARS-CoV-2 viral copies this assay can detect is 138 copies/mL. A negative result does not preclude SARS-Cov-2 infection and should not be used as the sole basis for treatment or other patient management decisions. A negative result may occur with  improper specimen collection/handling, submission of specimen other than nasopharyngeal  swab, presence of viral mutation(s) within the areas targeted by this assay, and inadequate number of viral copies(<138 copies/mL). A negative result must be combined with clinical observations, patient history, and epidemiological information. The expected result is Negative.  Fact Sheet for Patients:  EntrepreneurPulse.com.au  Fact Sheet for Healthcare Providers:  IncredibleEmployment.be  This test is no t yet approved or cleared by the [place] FDA and  has been authorized for detection and/or diagnosis of SARS-CoV-2 by FDA under an Emergency Use Authorization (EUA). This EUA will remain  in effect (meaning this test can be used) for the duration of the COVID-19 declaration under Section 564(b)(1) of the Act, 21 U.S.C.section 360bbb-3(b)(1), unless the authorization is terminated  or revoked sooner.       Influenza A by PCR NEGATIVE NEGATIVE Final   Influenza B by PCR NEGATIVE NEGATIVE Final    Comment: (NOTE) The Xpert Xpress SARS-CoV-2/FLU/RSV plus assay is intended as an aid in the diagnosis of influenza from Nasopharyngeal swab specimens and should not be used as a sole basis for treatment. Nasal washings and aspirates are unacceptable for Xpert Xpress SARS-CoV-2/FLU/RSV testing.  Fact Sheet for Patients: EntrepreneurPulse.com.au  Fact Sheet for Healthcare Providers: IncredibleEmployment.be  This test is not yet approved or cleared by the [place] FDA and has been authorized for detection and/or diagnosis of SARS-CoV-2 by FDA under an Emergency Use Authorization (EUA). This EUA will remain in effect (meaning this test can be used) for the duration of the COVID-19 declaration under Section 564(b)(1) of the Act, 21 U.S.C. section 360bbb-3(b)(1), unless the authorization is terminated or revoked.  Performed at [facility], [address], [place], [place] [address]   Urine  Culture     Status: None   Collection Time: 12/31/19  9:15 PM   Specimen: Urine, Random  Result Value Ref Range Status   Specimen Description   Final    URINE, RANDOM Performed at [facility], [address] [place], [place] [address]    Special Requests   Final    NONE Performed at [facility], [address] [place], [place] [address]    Culture   Final    NO GROWTH Performed at [facility], [place] [address], [place], [place] [address]    Report Status 01/02/2020 FINAL  Final  C Difficile Quick Screen w PCR reflex     Status: None   Collection Time: 01/01/20  6:09 AM   Specimen: STOOL  Result Value Ref Range Status   C Diff antigen NEGATIVE NEGATIVE Final  C Diff toxin NEGATIVE NEGATIVE Final   C Diff interpretation No C. difficile detected.  Final    Comment: Performed at [facility], [address] [place], [place] [address]  Gastrointestinal Panel by PCR , Stool     Status: Abnormal   Collection Time: 01/01/20  6:09 AM   Specimen: STOOL  Result Value Ref Range Status   Campylobacter species NOT DETECTED NOT DETECTED Final   Plesimonas shigelloides NOT DETECTED NOT DETECTED Final   Salmonella species NOT DETECTED NOT DETECTED Final   Yersinia enterocolitica NOT DETECTED NOT DETECTED Final   Vibrio species NOT DETECTED NOT DETECTED Final   Vibrio cholerae NOT DETECTED NOT DETECTED Final   Enteroaggregative E coli (EAEC) NOT DETECTED NOT DETECTED Final   Enteropathogenic E coli (EPEC) DETECTED (A) NOT DETECTED Final    Comment: RESULT CALLED TO, READ BACK BY AND VERIFIED WITH: ISABELLA LAPIETRA ON 01/01/20 AT 0751 QSD    Enterotoxigenic E coli (ETEC) NOT DETECTED NOT DETECTED Final   Shiga like toxin producing E coli (STEC) NOT DETECTED NOT DETECTED Final   Shigella/Enteroinvasive E coli (EIEC) NOT DETECTED NOT DETECTED Final   Cryptosporidium NOT DETECTED NOT DETECTED Final   Cyclospora cayetanensis NOT DETECTED NOT DETECTED  Final   Entamoeba histolytica NOT DETECTED NOT DETECTED Final   Giardia lamblia NOT DETECTED NOT DETECTED Final   Adenovirus F40/41 NOT DETECTED NOT DETECTED Final   Astrovirus NOT DETECTED NOT DETECTED Final   Norovirus GI/GII NOT DETECTED NOT DETECTED Final   Rotavirus A NOT DETECTED NOT DETECTED Final   Sapovirus (I, II, IV, and V) NOT DETECTED NOT DETECTED Final    Comment: Performed at [facility], [address] [place], [place] [address]  Blood culture (routine x 2)     Status: None (Preliminary result)   Collection Time: 01/01/20 11:47 AM   Specimen: BLOOD  Result Value Ref Range Status   Specimen Description BLOOD LEFT ARM  Final   Special Requests   Final    BOTTLES DRAWN AEROBIC AND ANAEROBIC Blood Culture results may not be optimal due to an excessive volume of blood received in culture bottles   Culture   Final    NO GROWTH 3 DAYS Performed at [facility], [address] [place], [place] [address]    Report Status PENDING  Incomplete  Blood culture (routine x 2)     Status: None (Preliminary result)   Collection Time: 01/01/20 12:36 PM   Specimen: BLOOD  Result Value Ref Range Status   Specimen Description BLOOD BLOOD RIGHT HAND  Final   Special Requests   Final    BOTTLES DRAWN AEROBIC ONLY Blood Culture results may not be optimal due to an inadequate volume of blood received in culture bottles   Culture   Final    NO GROWTH 3 DAYS Performed at [facility], [address] [place], [place] [address]    Report Status PENDING  Incomplete  MRSA PCR Screening     Status: None   Collection Time: 01/03/20  1:33 PM   Specimen: Nasal Mucosa; Nasopharyngeal  Result Value Ref Range Status   MRSA by PCR NEGATIVE NEGATIVE Final    Comment:        The GeneXpert MRSA Assay (FDA approved for NASAL specimens only), is one component of a comprehensive MRSA colonization surveillance program. It is not intended to diagnose MRSA infection nor  to guide or monitor treatment for MRSA infections. Performed at [facility], [address], [place], [place] [address]      Radiology Studies: EEG  Result Date: 01/02/2020 Lora Havens, MD     01/02/2020 12:08 PM Patient Name: Amier Hoyt MRN: [id number] Epilepsy Attending: Lora Havens Referring Physician/Provider: Dr Donnetta Simpers Date: 01/02/2020 Duration: 25.27 mins Patient history: 69 y.o. 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 He was noted to have right eye deviation with twitching eye movements concerning for a possible seizure. EEG to evaluate for seizure Level of alertness: Awake, asleep AEDs during EEG study: LEV Technical aspects: This EEG study was done with scalp electrodes positioned according to the 10-20 International system of electrode placement. Electrical activity was acquired at a sampling rate of _0  and reviewed with a high frequency filter of _1  and a low frequency filter of _2 . EEG data were recorded continuously and digitally stored. Description: The posterior dominant rhythm consists of 8 Hz activity of moderate voltage (25-35 uV) seen predominantly in posterior head regions, symmetric and reactive to eye opening and eye closing. Sleep was characterized by vertex waves, sleep spindles (12 to 14 Hz), maximal frontocentral region.  EEG showed intermittent generalized 3 to 6 Hz theta-delta slowing. Hyperventilation and photic stimulation were not performed.   ABNORMALITY -Intermittent slow, generalized IMPRESSION: This study is suggestive of mild diffuse encephalopathy, nonspecific etiology. No seizures or epileptiform discharges were seen throughout the recording. Priyanka Barbra Sarks   CT ANGIO HEAD W OR WO CONTRAST  Result Date: 01/03/2020 CLINICAL DATA:   Initial evaluation for acute ataxia, follow-up exam for stroke. EXAM: CT ANGIOGRAPHY HEAD AND NECK TECHNIQUE: Multidetector CT imaging of the head and neck was performed using the standard protocol during bolus administration of intravenous contrast. Multiplanar CT image reconstructions and MIPs were obtained to evaluate the vascular anatomy. Carotid stenosis measurements (when applicable) are obtained utilizing NASCET criteria, using the distal internal carotid diameter as the denominator. CONTRAST:  60m OMNIPAQUE IOHEXOL 350 MG/ML SOLN COMPARISON:  Prior brain MRI from 01/02/2020. FINDINGS: CT HEAD FINDINGS Brain: Age-related cerebral atrophy with moderate chronic microvascular ischemic disease. Remote lacunar infarcts noted at the left basal ganglia/corona radiata. Evolving cytotoxic edema involving the inferior left cerebellum consistent with previously identified left PICA territory infarcts. Additional previously described ischemic change about the left middle cerebellar peduncle and medulla not well visualized by CT. No evidence for hemorrhagic transformation or significant regional mass effect. No other acute large vessel territory infarct. No mass lesion, midline shift or mass effect. No hydrocephalus or extra-axial fluid collection. Vascular: No hyperdense vessel. Scattered vascular calcifications noted within the carotid siphons. Skull: Scalp soft tissues and calvarium within normal limits. Sinuses: Paranasal sinuses and mastoid air cells are clear. Orbits: Globes and orbital soft tissues within normal limits. CTA NECK FINDINGS Aortic arch: Visualized aortic arch of normal caliber with normal 3 vessel morphology. No hemodynamically significant stenosis about the origin of the great vessels. Right carotid system: Right common and internal carotid arteries widely patent without stenosis, dissection or occlusion. Left carotid system: Left common and internal carotid arteries widely patent without stenosis,  dissection or occlusion. Vertebral arteries: Both vertebral arteries arise from the subclavian arteries. No proximal subclavian artery stenosis. Vertebral arteries are diffusely hypoplastic bilaterally but remain patent within the neck without stenosis, dissection or occlusion. Skeleton: No acute osseous finding. No discrete or worrisome osseous  lesions. Moderate cervical spondylosis noted at C5-6 and C6-7. Poor dentition noted. Other neck: No other acute soft tissue abnormality within the neck. No mass or adenopathy. Upper chest: Visualized upper chest demonstrates no acute finding. Review of the MIP images confirms the above findings CTA HEAD FINDINGS Anterior circulation: Petrous and cavernous segments patent bilaterally. Atheromatous irregularity within the supraclinoid ICAs without hemodynamically significant stenosis. A1 segments patent bilaterally. Normal anterior communicating artery complex. Anterior cerebral arteries patent to their distal aspects without high-grade stenosis. Left M1 segment widely patent. Short-segment moderate stenosis noted at the mid right M1 segment (series 15, image 64). Normal left MCA bifurcation. Right MCA trifurcations. Moderate atheromatous irregularity throughout the MCA branches distally. No proximal M2 branch occlusion. Posterior circulation: Right vertebral artery patent as it courses into the cranial vault. Right PICA origin patent and perfused. Right V4 segment essentially occludes just beyond the takeoff of the right PICA. Left vertebral artery essentially occludes at the cranial vault, with only scant thready flow seen distally. Left PICA not visualized, likely occluded. Basilar artery is diffusely diminutive with markedly irregular and scant thready flow. Perfusion of the basilar tip via collateralization from the anterior circulation. Superior cerebral arteries are perfused bilaterally. Predominant fetal type origin of the PCAs. PCAs are irregular but remain patent to  their distal aspects without high-grade stenosis. Venous sinuses: Patent allowing for timing of the contrast bolus. Anatomic variants: Predominant fetal type origin of the PCAs with overall diminutive vertebrobasilar system. Review of the MIP images confirms the above findings IMPRESSION: CT HEAD IMPRESSION: 1. Continued interval evolution of scattered left PICA and posterior fossa infarcts, grossly stable from previous MRI. No evidence for hemorrhagic transformation or significant regional mass effect. 2. No other new acute intracranial abnormality. 3. Underlying atrophy with chronic small vessel ischemic disease. CTA HEAD AND NECK IMPRESSION: 1. 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 The left PICA is not visualized, likely occluded. Both PCAs and SCAs remain perfused via the anterior circulation. 2. Short-segment moderate stenosis involving the mid right M1 segment. 3. Wide patency of the major arterial vasculature in the neck. Results were discussed by telephone at the time of interpretation on 01/03/2020 at 4:40 am with provider [facility]. Electronically Signed   By: Jeannine Boga M.D.   On: 01/03/2020 04:49   CT ANGIO NECK W OR WO CONTRAST  Result Date: 01/03/2020 CLINICAL DATA:  Initial evaluation for acute ataxia, follow-up exam for stroke. EXAM: CT ANGIOGRAPHY HEAD AND NECK TECHNIQUE: Multidetector CT imaging of the head and neck was performed using the standard protocol during bolus administration of intravenous contrast. Multiplanar CT image reconstructions and MIPs were obtained to evaluate the vascular anatomy. Carotid stenosis measurements (when applicable) are obtained utilizing NASCET criteria, using the distal internal carotid diameter as the  denominator. CONTRAST:  32m OMNIPAQUE IOHEXOL 350 MG/ML SOLN COMPARISON:  Prior brain MRI from 01/02/2020. FINDINGS: CT HEAD FINDINGS Brain: Age-related cerebral atrophy with moderate chronic microvascular ischemic disease. Remote lacunar infarcts noted at the left basal ganglia/corona radiata. Evolving cytotoxic edema involving the inferior left cerebellum consistent with previously identified left PICA territory infarcts. Additional previously described ischemic change about the left middle cerebellar peduncle and medulla not well visualized by CT. No evidence for hemorrhagic transformation or significant regional mass effect. No other acute large vessel territory  infarct. No mass lesion, midline shift or mass effect. No hydrocephalus or extra-axial fluid collection. Vascular: No hyperdense vessel. Scattered vascular calcifications noted within the carotid siphons. Skull: Scalp soft tissues and calvarium within normal limits. Sinuses: Paranasal sinuses and mastoid air cells are clear. Orbits: Globes and orbital soft tissues within normal limits. CTA NECK FINDINGS Aortic arch: Visualized aortic arch of normal caliber with normal 3 vessel morphology. No hemodynamically significant stenosis about the origin of the great vessels. Right carotid system: Right common and internal carotid arteries widely patent without stenosis, dissection or occlusion. Left carotid system: Left common and internal carotid arteries widely patent without stenosis, dissection or occlusion. Vertebral arteries: Both vertebral arteries arise from the subclavian arteries. No proximal subclavian artery stenosis. Vertebral arteries are diffusely hypoplastic bilaterally but remain patent within the neck without stenosis, dissection or occlusion. Skeleton: No acute osseous finding. No discrete or worrisome osseous lesions. Moderate cervical spondylosis noted at C5-6 and C6-7. Poor dentition noted. Other neck: No other acute soft tissue abnormality  within the neck. No mass or adenopathy. Upper chest: Visualized upper chest demonstrates no acute finding. Review of the MIP images confirms the above findings CTA HEAD FINDINGS Anterior circulation: Petrous and cavernous segments patent bilaterally. Atheromatous irregularity within the supraclinoid ICAs without hemodynamically significant stenosis. A1 segments patent bilaterally. Normal anterior communicating artery complex. Anterior cerebral arteries patent to their distal aspects without high-grade stenosis. Left M1 segment widely patent. Short-segment moderate stenosis noted at the mid right M1 segment (series 15, image 64). Normal left MCA bifurcation. Right MCA trifurcations. Moderate atheromatous irregularity throughout the MCA branches distally. No proximal M2 branch occlusion. Posterior circulation: Right vertebral artery patent as it courses into the cranial vault. Right PICA origin patent and perfused. Right V4 segment essentially occludes just beyond the takeoff of the right PICA. Left vertebral artery essentially occludes at the cranial vault, with only scant thready flow seen distally. Left PICA not visualized, likely occluded. Basilar artery is diffusely diminutive with markedly irregular and scant thready flow. Perfusion of the basilar tip via collateralization from the anterior circulation. Superior cerebral arteries are perfused bilaterally. Predominant fetal type origin of the PCAs. PCAs are irregular but remain patent to their distal aspects without high-grade stenosis. Venous sinuses: Patent allowing for timing of the contrast bolus. Anatomic variants: Predominant fetal type origin of the PCAs with overall diminutive vertebrobasilar system. Review of the MIP images confirms the above findings IMPRESSION: CT HEAD IMPRESSION: 1. Continued interval evolution of scattered left PICA and posterior fossa infarcts, grossly stable from previous MRI. No evidence for hemorrhagic transformation or  significant regional mass effect. 2. No other new acute intracranial abnormality. 3. Underlying atrophy with chronic small vessel ischemic disease. CTA HEAD AND NECK IMPRESSION: 1. 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 The left PICA is not visualized, likely occluded. Both PCAs and SCAs remain perfused via the anterior circulation. 2. Short-segment moderate stenosis involving the mid right M1 segment. 3. Wide patency of the major arterial vasculature in the neck. Results were discussed by telephone at the time of interpretation on 01/03/2020 at 4:40 am with provider [facility]. Electronically Signed   By: Jeannine Boga M.D.   On: 01/03/2020 04:49   MR BRAIN W WO CONTRAST  Addendum Date: 01/03/2020   ADDENDUM REPORT: 01/03/2020 06:12 ADDENDUM:  Results communicated by telephone to nurse practitioner Rufina Falco at 1:20 a.m. on 01/02/2020. Electronically Signed   By: Jeannine Boga M.D.   On: 01/03/2020 06:12   Result Date: 01/03/2020 CLINICAL DATA:  Initial evaluation for bacterial meningitis. Evaluate for resolution of previously seen subtle diffusion abnormality from prior exam. EXAM: MRI HEAD WITHOUT AND WITH CONTRAST TECHNIQUE: Multiplanar, multiecho pulse sequences of the brain and surrounding structures were obtained without and with intravenous contrast. CONTRAST:  5m GADAVIST GADOBUTROL 1 MMOL/ML IV SOLN COMPARISON:  Prior MRI from 01/01/2020. FINDINGS: Brain: Examination mildly degraded by motion artifact, particularly the postcontrast sequences. Generalized age-related cerebral atrophy. Patchy and confluent T2/FLAIR hyperintensity within the periventricular and deep white matter both cerebral hemispheres most consistent with chronic small vessel  ischemic disease, moderate in nature. Few small remote lacunar infarcts noted at the left basal ganglia/corona radiata. Previously identified diffusion abnormality involving the left cerebellum again seen, markedly more intense on today's exam as compared to previous (series 6, image 11). Scattered patchy involvement of the left middle cerebellar peduncle and ventral medulla (series 6, images 15, 13). Associated mildly decreased signal on corresponding ADC map, with prominent T2/FLAIR hyperintensity within the areas affected. Following contrast administration, there is patchy and somewhat linear postcontrast enhancement throughout the areas involved (series 22, images 8, 9). Given appearance and distribution, findings felt to be most consistent with acute to subacute ischemic infarcts, predominantly left PICA territory. Suspected faint petechial hemorrhage at the inferior left cerebellum without hemorrhagic transformation (series 13, image 14). No other new diffusion abnormality to suggest acute or subacute ischemia seen elsewhere within the brain. Gray-white matter differentiation otherwise maintained. No other areas of chronic cortical infarction. No other foci of susceptibility artifact to suggest acute or chronic intracranial hemorrhage. No mass lesion, midline shift or mass effect. No hydrocephalus or extra-axial fluid collection. Pituitary gland and suprasellar region within normal limits. Midline structures intact. No other abnormal enhancement to suggest meningitis or other intracranial infection. No intrinsic temporal lobe abnormality. Vascular: Intracranial flow voids within the bilateral V4 segments and basilar artery are not well seen, and could be partially occluded given the posterior circulation infarcts (series 10, images 5, 8). There is suspected predominant fetal type origin of the PCAs. Major intracranial vascular flow voids are otherwise maintained. Skull and upper cervical spine: Craniocervical  junction within normal limits. Bone marrow signal intensity normal. No scalp soft tissue abnormality. Sinuses/Orbits: Globes and orbital soft tissues within normal limits. Mild mucosal thickening noted within the ethmoidal air cells and maxillary sinuses. Paranasal sinuses are otherwise clear. No mastoid effusion. Inner ear structures grossly normal. Other: None. IMPRESSION: 1. Persistent and increased prominence of previously identified left cerebellar diffusion abnormality, with additional patchy involvement of the left middle cerebellar peduncle and medulla. Given appearance and distribution on today's exam, findings felt to be most consistent with acute to subacute ischemic infarcts, predominantly left PICA territory. Suspected faint petechial hemorrhage at the inferior left cerebellum without hemorrhagic transformation. 2. Poorly visualized and potentially absent flow voids within the distal V4 segments and basilar artery, which could be occluded given the posterior circulation infarcts. Correlation with dedicated CTA suggested. 3. No other acute intracranial abnormality. No other abnormal enhancement to suggest meningitis or other intracranial infection. No other findings of acute seizure. 4. Underlying age-related cerebral atrophy with moderate chronic microvascular ischemic disease. Current attempt is being made to contact the covering physician regarding these findings, results will be communicated as soon as possible. Electronically Signed: By: BJeannine Boga  M.D. On: 01/03/2020 00:41    Scheduled Meds: . aspirin  81 mg Oral Daily  . atorvastatin  80 mg Oral q1800  . Chlorhexidine Gluconate Cloth  6 each Topical Daily  . clopidogrel  75 mg Oral Daily  . cromolyn  2 drop Left Eye QID  . enoxaparin (LOVENOX) injection  40 mg Subcutaneous Q24H  . erythromycin   Left Eye Once  . fluticasone  2 spray Each Nare Daily  . loratadine  10 mg Oral Daily  . tamsulosin  0.4 mg Oral QPC supper    Continuous Infusions: . sodium chloride 100 mL/hr at 01/04/20 0821     LOS: 4 days   Time spent: 35 minutes.  Desma Maxim, MD [facility]  If 7PM-7AM, please contact night-coverage [email or web address]  01/04/2020, 10:54 AM

## 2020-01-04 NOTE — Progress Notes (Signed)
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 He was noted to have an right eye deviation with twitching eye movements concerning for a possible seizure which prompted an MRI which demosntrated a L Cerebellar PICA stroke which was not as prominent. Repeat MRI Brain demonstrated more obvious DWI changes consistent with a L PICA stroke and associated evolving changes.   Interval Hx   Has a left upper and lower facial droop today.  Vitals   Vitals:   01/04/20 0800 01/04/20 0900 01/04/20 1000 01/04/20 1100  BP: (!) 144/88 (!) 176/95 (!) 148/91 (!) 159/112  Pulse: 90  90 (!) 104  Resp: 17 (!) 25 19 17   Temp: 98 F (36.7 C)     TempSrc: Oral     SpO2:   99% 100%  Weight:      Height:         Body mass index is 21.74 kg/m.  Physical Exam   General: Laying comfortably in bed; in no acute distress. HENT: Normal oropharynx and mucosa. Normal external appearance of ears and nose. Neck: Supple, no pain or tenderness CV: No JVD. No peripheral edema. Pulmonary: Symmetric Chest rise. Normal respiratory effort. Abdomen: Soft to touch, non-tender. Ext: No cyanosis, edema, or deformity Skin: No rash. Normal palpation of skin.  Musculoskeletal: Normal digits and nails by inspection. No clubbing.  Neurologic Examination  Mental status/Cognition: Awake, alert, oriented to self, place, month. Poor attention. Speech/language: Dysarthric speech, fluent, comprehension intact, object naming intact, repetition intact. Cranial nerves:   CN II Pupils equal and reactive to light, no VF deficits   CN III,IV,VI EOM intact, no gaze  preference or deviation, no nystagmus   CN V normal sensation in V1, V2, and V3 segments bilaterally   CN VII Left upper and lower facial droop that is new from yesterday.   CN VIII normal hearing to speech   CN IX & X    CN XI    CN XII midline tongue protrusion   Motor:  Muscle bulk: poor, tone normal, pronator drift none tremor none Mvmt Root Nerve  Muscle Right Left Comments  SA C5/6 Ax Deltoid 5 5   EF C5/6 Mc Biceps 5 5   EE C6/7/8 Rad Triceps 5 5   WF C6/7 Med FCR 5 5   WE C7/8 PIN ECU 5 5   F Ab C8/T1 U ADM/FDI 5 5   HF L1/2/3 Fem Illopsoas 5 5   KE L2/3/4 Fem Quad     DF L4/5 D Peron Tib Ant 5 5   PF S1/2 Tibial Grc/Sol 5 5    Reflexes:  Right Left Comments  Pectoralis      Biceps (C5/6) 1 1   Brachioradialis (C5/6) 1 1    Triceps (C6/7) 1 1    Patellar (L3/4) 1 1    Achilles (S1)      Hoffman      Plantar     Jaw jerk     Sensation:  Light touch Intact in all extremities and face.   Pin prick    Temperature    Vibration   Proprioception     Coordination/Complex Motor:  - Finger to Nose  with mild ataxia in LUE - Heel to shin unable to get him to do. - Rapid alternating movement are slowed bilaterally  NIHSS components Score: Comment  1a Level of Conscious 0[x]  1[]  2[]  3[]      1b LOC Questions 0[x]  1[]  2[]       1c LOC Commands 0[x]  1[]  2[]       2 Best Gaze 0[x]  1[]  2[]       3 Visual 0[x]  1[]  2[]  3[]      4 Facial Palsy 0[]  1[x]  2[]  3[]      5a Motor Arm - left 0[x]  1[]  2[]  3[]  4[]  UN[]    5b Motor Arm - Right 0[x]  1[]  2[]  3[]  4[]  UN[]    6a Motor Leg - Left 0[x]  1[]  2[]  3[]  4[]  UN[]    6b Motor Leg - Right 0[x]  1[]  2[]  3[]  4[]  UN[]    7 Limb Ataxia 0[]  1[x]  2[]  3[]  UN[]     8 Sensory 0[x]  1[]  2[]  UN[]      9 Best Language 0[x]  1[]  2[]  3[]      10 Dysarthria 0[]  1[]  2[x]  UN[]      11 Extinct. and Inattention 0[x]  1[]  2[]       TOTAL: 4      Labs   Basic Metabolic Panel:  Lab Results  Component Value Date   NA  137 01/04/2020   K 3.4 (L) 01/04/2020   CO2 17 (L) 01/04/2020   GLUCOSE 72 01/04/2020   BUN 10 01/04/2020   CREATININE 0.78 01/04/2020   CALCIUM 8.8 (L) 01/04/2020   GFRNONAA >60 01/04/2020   HbA1c:  Lab Results  Component Value Date   HGBA1C 5.9 (H) 01/03/2020   LDL:  Lab Results  Component Value Date   LDLCALC 42 01/03/2020   Urine Drug Screen: No results found for: LABOPIA, COCAINSCRNUR, LABBENZ, AMPHETMU, THCU, LABBARB  Alcohol Level No results found for: ETH No results found for: PHENYTOIN, ZONISAMIDE, LAMOTRIGINE, LEVETIRACETA No results found for: PHENYTOIN, PHENOBARB, VALPROATE, CBMZ  Imaging and Diagnostic studies  Results for orders placed during the hospital encounter of 12/31/19  CT ANGIO NECK W OR WO CONTRAST  Narrative CLINICAL DATA:  Initial evaluation for acute ataxia, follow-up exam for stroke.  EXAM: CT ANGIOGRAPHY HEAD AND NECK  TECHNIQUE: Multidetector CT imaging of the head and neck was performed using the standard protocol during bolus administration of intravenous contrast. Multiplanar CT image reconstructions and MIPs were obtained to evaluate the vascular anatomy. Carotid stenosis measurements (when applicable) are obtained utilizing NASCET criteria, using the distal internal carotid diameter as the denominator.  CONTRAST:  72mL OMNIPAQUE IOHEXOL 350 MG/ML SOLN  COMPARISON:  Prior brain MRI from 01/02/2020.  FINDINGS: CT HEAD FINDINGS  Brain: Age-related cerebral atrophy with moderate chronic microvascular ischemic disease. Remote lacunar infarcts noted at the left basal ganglia/corona radiata.  Evolving cytotoxic edema involving the inferior left cerebellum consistent with previously identified left PICA territory infarcts. Additional previously described ischemic change about the left middle cerebellar peduncle and medulla not well visualized by CT. No evidence for hemorrhagic transformation or significant regional  mass effect.  No other acute large vessel territory infarct. No mass lesion, midline shift or mass effect. No hydrocephalus or extra-axial fluid collection.  Vascular: No hyperdense vessel. Scattered vascular calcifications noted within the carotid siphons.  Skull: Scalp soft tissues and calvarium within normal limits.  Sinuses: Paranasal sinuses and mastoid air cells are clear.  Orbits: Globes and orbital soft tissues within normal limits.  CTA NECK FINDINGS  Aortic arch: Visualized aortic arch of normal caliber  with normal 3 vessel morphology. No hemodynamically significant stenosis about the origin of the great vessels.  Right carotid system: Right common and internal carotid arteries widely patent without stenosis, dissection or occlusion.  Left carotid system: Left common and internal carotid arteries widely patent without stenosis, dissection or occlusion.  Vertebral arteries: Both vertebral arteries arise from the subclavian arteries. No proximal subclavian artery stenosis. Vertebral arteries are diffusely hypoplastic bilaterally but remain patent within the neck without stenosis, dissection or occlusion.  Skeleton: No acute osseous finding. No discrete or worrisome osseous lesions. Moderate cervical spondylosis noted at C5-6 and C6-7. Poor dentition noted.  Other neck: No other acute soft tissue abnormality within the neck. No mass or adenopathy.  Upper chest: Visualized upper chest demonstrates no acute finding.  Review of the MIP images confirms the above findings  CTA HEAD FINDINGS  Anterior circulation: Petrous and cavernous segments patent bilaterally. Atheromatous irregularity within the supraclinoid ICAs without hemodynamically significant stenosis. A1 segments patent bilaterally. Normal anterior communicating artery complex. Anterior cerebral arteries patent to their distal aspects without high-grade stenosis. Left M1 segment widely patent.  Short-segment moderate stenosis noted at the mid right M1 segment (series 15, image 64). Normal left MCA bifurcation. Right MCA trifurcations. Moderate atheromatous irregularity throughout the MCA branches distally. No proximal M2 branch occlusion.  Posterior circulation: Right vertebral artery patent as it courses into the cranial vault. Right PICA origin patent and perfused. Right V4 segment essentially occludes just beyond the takeoff of the right PICA. Left vertebral artery essentially occludes at the cranial vault, with only scant thready flow seen distally. Left PICA not visualized, likely occluded. Basilar artery is diffusely diminutive with markedly irregular and scant thready flow. Perfusion of the basilar tip via collateralization from the anterior circulation. Superior cerebral arteries are perfused bilaterally. Predominant fetal type origin of the PCAs. PCAs are irregular but remain patent to their distal aspects without high-grade stenosis.  Venous sinuses: Patent allowing for timing of the contrast bolus.  Anatomic variants: Predominant fetal type origin of the PCAs with overall diminutive vertebrobasilar system.  Review of the MIP images confirms the above findings  IMPRESSION: CT HEAD IMPRESSION:  1. Continued interval evolution of scattered left PICA and posterior fossa infarcts, grossly stable from previous MRI. No evidence for hemorrhagic transformation or significant regional mass effect. 2. No other new acute intracranial abnormality. 3. Underlying atrophy with chronic small vessel ischemic disease.  CTA HEAD AND NECK IMPRESSION:  1. 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 The left PICA is not visualized, likely  occluded. Both PCAs and SCAs remain perfused via the anterior circulation. 2. Short-segment moderate stenosis involving the mid right M1 segment. 3. Wide patency of the major arterial vasculature in the neck.  Results were discussed by telephone at the time of interpretation on 01/03/2020 at 4:40 am with provider [facility].   Electronically Signed By: Rise Mu M.D. On: 01/03/2020 04:49   Impression   Maruice Pieroni is a 69 y.o. 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 MRI with L Cerebellar PICA stroke, CTA with severely diseased vertebrobasilar system with scant flow throgh BL intracranial vertebral arteries and basilar artery with  L PICA occlusion. He has BL fetal PCAs.  NIHSS has been stable here. Given new facial droop, will do permissive HTN for another 24 hours with head of bed flat.  Recommendations  - I ordered Q2H Neuro checks for 24 hours then Q4Hours. - Agree with obtaining TTE - LDL 42, continue Atorvastatin 80mg  daily. - HbA1c of 5.9. - Antithrombotic             - continue Plavix 75mg  daily x 3 months, then stop.             - continue Aspirin 81mg  daily - Recommend DVT ppx - SBP goal - permissive hypertension first 24 h < 220/110. Hold home meds.  - Recommend Telemetry monitoring for arrythmia - Recommend bedside swallow screen prior to PO intake. - Stroke education booklet - I discontinued PT,OT consult at this time and ordered strict bedrest with head of bed flat. - continue maintenance IV Fluids to 177ml/Hr - Please obtain CT Head without contrast and CT angio head and neck for worsening mentation, headache, nausea, vomiting, pupillary abnormality, change in NIHSS of 3 or more. If there is complete occlusion of the basilar, please contact neurologist on call  immediately. ______________________________________________________________________   Thank you for the opportunity to take part in the care of this patient. If you have any further questions, please contact the neurology consultation attending.  Signed,  [facility] Pager Number 

## 2020-01-04 NOTE — TOC Progression Note (Signed)
Transition of Care [facility]) - Progression Note    Patient Details  Name: Alexander Byrd MRN: [id number] Date of Birth: March 28, 1950  Transition of Care [facility] CM/SW Contact  Allayne Butcher, RN Phone Number: 01/04/2020, 1:53 PM  Clinical Narrative:    Referral for hospital bed and 3 in 1 given to [facility] with [employer].  Patient is not medically stable for discharge at this time but plan is for home with home health services.  Family has not decided on a home health agency yet.     Expected Discharge Plan: Home w Home Health Services Barriers to Discharge: Continued Medical Work up  Expected Discharge Plan and Services Expected Discharge Plan: Home w Home Health Services   Discharge Planning Services: CM Consult Post Acute Care Choice: Durable Medical Equipment,Home Health Living arrangements for the past 2 months: Single Family Home                 DME Arranged: Hospital bed,3-N-1 DME Agency: [employer]       HH Arranged: RN,PT,OT,Nurse's Aide           Social Determinants of Health (SDOH) Interventions    Readmission Risk Interventions No flowsheet data found.

## 2020-01-04 NOTE — Progress Notes (Signed)
Patient transferred to ICU on day shift due to concerns for worsening stroke. He is currently resting comfortably and is in no distress. Neuro assessments and NIH stroke scales are being completed Q4H. Complete NIHSS total has been 11 baseline this shift. Patient is NPO, and IV NS is infusing at 100. All vitals are WDL. Will continue to monitor.  Carmel Sacramento, RN

## 2020-01-04 NOTE — Progress Notes (Signed)
*  PRELIMINARY RESULTS* Echocardiogram 2D Echocardiogram has been performed.  Alexander Byrd 01/04/2020, 8:05 AM

## 2020-01-05 ENCOUNTER — Encounter: Payer: Self-pay | Admitting: Internal Medicine

## 2020-01-05 LAB — BASIC METABOLIC PANEL
Anion gap: 12 (ref 5–15)
BUN: 11 mg/dL (ref 8–23)
CO2: 17 mmol/L — ABNORMAL LOW (ref 22–32)
Calcium: 8.9 mg/dL (ref 8.9–10.3)
Chloride: 106 mmol/L (ref 98–111)
Creatinine, Ser: 0.9 mg/dL (ref 0.61–1.24)
GFR, Estimated: >60 mL/min (ref >60)
Glucose, Bld: 74 mg/dL (ref 70–99)
Potassium: 3.4 mmol/L — ABNORMAL LOW (ref 3.5–5.1)
Sodium: 135 mmol/L (ref 135–145)

## 2020-01-05 NOTE — Progress Notes (Signed)
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 He was noted to have an right eye deviation with twitching eye movements concerning for a possible seizure which prompted an MRI which demosntrated a L Cerebellar PICA stroke which was not as prominent. Repeat MRI Brain demonstrated more obvious DWI changes consistent with a L PICA stroke and associated evolving changes.   Interval Hx   Denies any acute events. Is more awake, dysarthric speech, facial droop is improved. Some LLE drifting.  Vitals   Vitals:   01/05/20 0900 01/05/20 1000 01/05/20 1100 01/05/20 1200  BP: 129/75 (!) 164/69 (!) 158/97 (!) 144/82  Pulse: 90  (!) 26 98  Resp: (!) 23 19 20 17   Temp:    99.3 F (37.4 C)  TempSrc:    Oral  SpO2: 99% 96% 94% 99%  Weight:      Height:         Body mass index is 21.74 kg/m.  Physical Exam   General: Laying comfortably in bed; in no acute distress. HENT: Normal oropharynx and mucosa. Normal external appearance of ears and nose. Neck: Supple, no pain or tenderness CV: No JVD. No peripheral edema. Pulmonary: Symmetric Chest rise. Normal respiratory effort. Abdomen: Soft to touch, non-tender. Ext: No cyanosis, edema, or deformity Skin: No rash. Normal palpation of skin.  Musculoskeletal: Normal digits and nails by inspection. No clubbing.  Neurologic Examination  Mental status/Cognition: Awake, alert, oriented to self, place, month. Poor attention. Speech/language: Dysarthric speech, fluent, comprehension intact, object naming intact, repetition intact. Cranial nerves:   CN II Pupils equal and reactive to  light, no VF deficits   CN III,IV,VI EOM intact, no gaze preference or deviation, no nystagmus   CN V normal sensation in V1, V2, and V3 segments bilaterally   CN VII Left upper and lower facial droop that is new from yesterday.   CN VIII normal hearing to speech   CN IX & X    CN XI    CN XII midline tongue protrusion   Motor:  Muscle bulk: poor, tone normal, pronator drift none tremor none Mvmt Root Nerve  Muscle Right Left Comments  SA C5/6 Ax Deltoid 5 5   EF C5/6 Mc Biceps 5 5   EE C6/7/8 Rad Triceps 5 5   WF C6/7 Med FCR 5 5   WE C7/8 PIN ECU 5 5   F Ab C8/T1 U ADM/FDI 5 5   HF L1/2/3 Fem Illopsoas 5 4+   KE L2/3/4 Fem Quad     DF L4/5 D Peron Tib Ant 5 5   PF S1/2 Tibial Grc/Sol 5 5    Reflexes:  Right Left Comments  Pectoralis      Biceps (C5/6) 1 1   Brachioradialis (C5/6) 1 1    Triceps (C6/7) 1 1    Patellar (L3/4) 1 1    Achilles (S1)      Hoffman      Plantar     Jaw jerk     Sensation:  Light touch Intact in all extremities and face.   Pin prick    Temperature    Vibration   Proprioception     Coordination/Complex  Motor:  - Finger to Nose with mild ataxia in LUE - Heel to shin unable to get him to do. - Rapid alternating movement are slowed bilaterally  NIHSS components Score: Comment  1a Level of Conscious 0[x]  1[]  2[]  3[]      1b LOC Questions 0[x]  1[]  2[]       1c LOC Commands 0[x]  1[]  2[]       2 Best Gaze 0[x]  1[]  2[]       3 Visual 0[x]  1[]  2[]  3[]      4 Facial Palsy 0[]  1[x]  2[]  3[]      5a Motor Arm - left 0[x]  1[]  2[]  3[]  4[]  UN[]    5b Motor Arm - Right 0[x]  1[]  2[]  3[]  4[]  UN[]    6a Motor Leg - Left 0[x]  1[x]  2[]  3[]  4[]  UN[]    6b Motor Leg - Right 0[x]  1[]  2[]  3[]  4[]  UN[]    7 Limb Ataxia 0[]  1[x]  2[]  3[]  UN[]     8 Sensory 0[x]  1[]  2[]  UN[]      9 Best Language 0[x]  1[]  2[]  3[]      10 Dysarthria 0[]  1[x]  2[]  UN[]      11 Extinct. and Inattention 0[x]  1[]  2[]       TOTAL: 4      Labs   Basic  Metabolic Panel:  Lab Results  Component Value Date   NA 135 01/05/2020   K 3.4 (L) 01/05/2020   CO2 17 (L) 01/05/2020   GLUCOSE 74 01/05/2020   BUN 11 01/05/2020   CREATININE 0.90 01/05/2020   CALCIUM 8.9 01/05/2020   GFRNONAA >60 01/05/2020   HbA1c:  Lab Results  Component Value Date   HGBA1C 5.9 (H) 01/03/2020   LDL:  Lab Results  Component Value Date   LDLCALC 42 01/03/2020   Urine Drug Screen: No results found for: LABOPIA, COCAINSCRNUR, LABBENZ, AMPHETMU, THCU, LABBARB  Alcohol Level No results found for: ETH No results found for: PHENYTOIN, ZONISAMIDE, LAMOTRIGINE, LEVETIRACETA No results found for: PHENYTOIN, PHENOBARB, VALPROATE, CBMZ  Imaging and Diagnostic studies   MRI Brain  IMPRESSION: 1. Technically limited exam due to extensive motion artifact. 2. Subtly increased diffusion abnormality involving the pons, left middle cerebellar peduncle, and adjacent left cerebellar hemisphere as above. Findings are nonspecific, but could reflect changes related to acute seizure given provided history. Correlation with EEG suggested. Differential considerations include changes related to acute/subacute ischemia versus a nonspecific cerebritis/encephalitis. Correlation with LP and CSF values also suggested as clinically warranted. Changes of PRES are considered, although felt to be less likely given the relative lack of additional changes elsewhere within the brain. 3. No other acute intracranial abnormality. 4. Age-related cerebral atrophy with moderate chronic small vessel ischemic disease, with a few scattered remote lacunar infarcts about the left basal ganglia/corona radiata.  CT Angio head and neck: 1. 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 The left PICA is not visualized, likely occluded. Both PCAs and SCAs remain perfused via the anterior circulation. 2. Short-segment moderate stenosis involving the mid right M1 segment. 3. Wide patency of the major arterial vasculature in the neck.  Impression   Jasean Ambrosia is a 69 y.o. 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 MRI  with L Cerebellar PICA stroke, CTA with severely diseased vertebrobasilar system with scant flow throgh BL intracranial vertebral arteries and basilar artery with L PICA occlusion. He has BL fetal PCAs.  NIHSS has been stable for the last 48 hours. Will get PT and OT evaluation and discontinue bed rest.  Recommendations  - Q4Hour neuro checks - TTE with EF of 60-65% - LDL 42, continue Atorvastatin 80mg  daily. - HbA1c of 5.9. - Antithrombotic             - continue Plavix 75mg  daily x 3 months, then stop.             - continue Aspirin 81mg  daily - Recommend DVT ppx - SBP goal - gradual normotension. - Recommend Telemetry monitoring for arrythmia - Stroke education booklet - Pt and OT consults - Can gradually taper off fluids. ____________________________________________________________________  Thank you for the opportunity to take part in the care of this patient. If you have any further questions, please contact the neurology consultation attending.  Signed,  [facility] Pager Number 

## 2020-01-05 NOTE — Progress Notes (Signed)
Chart reviewed, swallow eval completed. Full report to follow later today. Noted s/s of aspiration with nectar thick by TSP. Very slow oral transit with solids. Will order Dys 1 with honey thick liquids BY SPOON ONLY. Meds crushed in applesauce. STRICT aspiration precautions. ST to follow.

## 2020-01-05 NOTE — Progress Notes (Signed)
PROGRESS NOTE    Alexander Byrd  QQP:[id number] DOB: [birth year] DOA: 12/31/2019 PCP: Jennette Bill., MD   Brief Narrative: Taken from H&P. Alexander Byrd is a 69 y.o. 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  Initially met severe sepsis criteria with tachypnea, tachycardia, lactic acidosis with lactic acid peaked at 6, and encephalopathy received IV fluid and broad-spectrum antibiotics.  GI pathogen with pathogenic E. Coli, blood and urine cultures pending. Apparently blood cultures were drawn after starting antibiotics. CT head without any acute changes but MRI brain with some questionable increased diffusion abnormality involving pons, left middle cerebellar peduncle and left cerebellar hemisphere, concern for seizures/encephalitis.  There is 1 nursing concern when patient with persistent right gaze and rapid nystagmus concerning for seizure and he was started on Keppra.  Neurology was consulted. CT abdomen with concern of enteritis. Speech evaluated him and recommending dysphagia 2 diet. AM of 12/8 abnormal MRI showing subacute cerebellar stroke, transferred to ICU for close monitoring, neuro advising no interventions  Subjective: Patient was resting comfortably when seen this morning. More awake and alert, answers simple questions, can sit up in bed, agrees sister is hcpoa.   Assessment & Plan:   Active Problems:   Sepsis ([place])  CVA Ischemic left cerebellar stroke 11/21 hospitalized at [facility], since then dysphagia and left sided weakness. Repeat MRI performed 12/9 with L cerebellar PICA stroke. Seen by neurology, out of window for fibrinolytics, discussed w/ stroke team at [facility] who do not think needs acute intervention at this time. Stable neuro exam -  neuro following closely, appreciate their recis - tte w/ grade 1 dd, otherwise unremarkable - transfer to progressive bed, q4 neuro checks - Continue with aspirin and Lipitor - cont tele - plavix loaded, will need to continue 75 daily for 3 months - pt/ot consults - SLP evaluated, ok for dysphagia 1 diet for now - bp mildly elevated, holding amlodipine - IVF decrease to 50, plan to eventually d/c  Severe sepsis with acute enteritis.  POA. GI pathogen with epec. c diff neg. Lactic acid has normalized. Ct showing enteritis. Urine culture ngtd. Blood cultures also ngtd. Is s/p cefepime/flagyl. Hemodynamically stable. RESOLVED  - s/p 3 days azithromycin - Continue with gentle hydration as above  Encephalopathy. POA Likely 2/2 series of CVAs, acute infection likely contributory. Improving. - monitor  AKI.  Resolved. Creatinine recent [facility] hospitalization trended around 1.1. PO remains poor - Continue to monitor renal function. - Avoid nephrotoxins. - continue IVF  Partial urinary retention.  CT abdomen with prostatomegaly and bladder scan with more than 400 cc requiring one time in and out catheter.  There is some concern of urinary retention.  That can also contribute to AKI.  Apparently multiple soiled diapers, so most likely partial retention or incomplete emptying of bladder. Does not cooperate for post-void residual scan. - started on flomax - monitor  Essential hypertension.  Blood pressure mildly elevated today - holding amlodipine and lisinopril  Hyperlipidemia. -Continue with statin  Objective: Vitals:   01/05/20 0900 01/05/20 1000 01/05/20 1100 01/05/20 1200  BP: 129/75 (!) 164/69 (!) 158/97 (!) 144/82  Pulse: 90  (!) 26 98  Resp: (!) $RemoveB'23 19 20 17  'uoQNKwIv$ Temp:    99.3 F (37.4 C)  TempSrc:  Oral  SpO2: 99% 96% 94% 99%  Weight:      Height:        Intake/Output Summary (Last 24 hours) at 01/05/2020 1235 Last data filed at 01/05/2020 1134 Gross per 24 hour  Intake  3054.78 ml  Output 2225 ml  Net 829.78 ml   Filed Weights   12/31/19 2008 12/31/19 2231 01/03/20 1129  Weight: 74.8 kg 74.8 kg 72.7 kg    Examination:  General exam: Appears calm and comfortable  Respiratory system: Clear to auscultation. Respiratory effort normal. Cardiovascular system: S1 & S2 heard, RRR. Gastrointestinal system: Soft, nontender, nondistended, bowel sounds positive. Central nervous system: moves all 4 extremities Extremities: No edema, no cyanosis, pulses intact and symmetrical. Psychiatry: unable to assess   DVT prophylaxis: Lovenox Code Status: Full Family Communication: sister updated telephonically 12/10 Disposition Plan:  Status is: Inpatient  Remains inpatient appropriate because:Inpatient level of care appropriate due to severity of illness   Dispo: The patient is from: SNF              Anticipated d/c is to: snf though family says they would prefer home if possible              Anticipated d/c date is: >3 days              Patient currently is not medically stable to d/c.  Consultants:   Neurology  Procedures:  Antimicrobials:  S/p cefepime/flagyl Azithromycin 12/7>12/9  Data Reviewed: I have personally reviewed following labs and imaging studies  CBC: Recent Labs  Lab 12/31/19 2015 01/01/20 0609 01/02/20 0506  WBC 10.5 7.9 4.9  HGB 14.6 12.6* 12.6*  HCT 42.5 36.6* 36.3*  MCV 89.1 88.4 89.0  PLT 382 254 161   Basic Metabolic Panel: Recent Labs  Lab 01/01/20 0609 01/02/20 0506 01/03/20 0521 01/04/20 0646 01/05/20 0555  NA 137 137 138 137 135  K 3.6 3.7 3.4* 3.4* 3.4*  CL 110 110 108 106 106  CO2 18* 20* 22 17* 17*  GLUCOSE 126* 94 98 72 74  BUN $Re'21 14 12 10 11  'MZg$ CREATININE 1.31* 1.19 1.05 0.78 0.90  CALCIUM 8.9 8.8* 8.5* 8.8* 8.9   GFR: Estimated Creatinine Clearance: 78.5 mL/min (by C-G formula based on SCr of 0.9 mg/dL). Liver Function Tests: Recent Labs  Lab 12/31/19 2015 01/01/20 0609  AST 22 21  ALT 19 16   ALKPHOS 98 82  BILITOT 1.0 1.0  PROT 7.9 6.5  ALBUMIN 3.7 3.1*   Recent Labs  Lab 12/31/19 2015  LIPASE 33   Recent Labs  Lab 12/31/19 2059  AMMONIA 13   Coagulation Profile: Recent Labs  Lab 12/31/19 2048  INR 1.1   Cardiac Enzymes: No results for input(s): CKTOTAL, CKMB, CKMBINDEX, TROPONINI in the last 168 hours. BNP (last 3 results) No results for input(s): PROBNP in the last 8760 hours. HbA1C: Recent Labs    01/03/20 0521  HGBA1C 5.9*   CBG: Recent Labs  Lab 01/03/20 1127 01/03/20 2006  GLUCAP 87 81   Lipid Profile: Recent Labs    01/03/20 0521  CHOL 84  HDL 28*  LDLCALC 42  TRIG 68  CHOLHDL 3.0   Thyroid Function Tests: No results for input(s): TSH, T4TOTAL, FREET4, T3FREE, THYROIDAB in the last 72 hours. Anemia Panel: No results for input(s): VITAMINB12, FOLATE, FERRITIN, TIBC, IRON, RETICCTPCT in the last 72 hours. Sepsis Labs: Recent Labs  Lab 12/31/19 2353 01/01/20 0317 01/01/20 0609 01/02/20 0506  LATICACIDVEN 6.0* 4.5*  2.6* 1.0    Recent Results (from the past 240 hour(s))  Resp Panel by RT-PCR (Flu A&B, Covid) Nasopharyngeal Swab     Status: None   Collection Time: 12/31/19  8:48 PM   Specimen: Nasopharyngeal Swab; Nasopharyngeal(NP) swabs in vial transport medium  Result Value Ref Range Status   SARS Coronavirus 2 by RT PCR NEGATIVE NEGATIVE Final    Comment: (NOTE) SARS-CoV-2 target nucleic acids are NOT DETECTED.  The SARS-CoV-2 RNA is generally detectable in upper respiratory specimens during the acute phase of infection. The lowest concentration of SARS-CoV-2 viral copies this assay can detect is 138 copies/mL. A negative result does not preclude SARS-Cov-2 infection and should not be used as the sole basis for treatment or other patient management decisions. A negative result may occur with  improper specimen collection/handling, submission of specimen other than nasopharyngeal swab, presence of viral mutation(s)  within the areas targeted by this assay, and inadequate number of viral copies(<138 copies/mL). A negative result must be combined with clinical observations, patient history, and epidemiological information. The expected result is Negative.  Fact Sheet for Patients:  EntrepreneurPulse.com.au  Fact Sheet for Healthcare Providers:  IncredibleEmployment.be  This test is no t yet approved or cleared by the [place] FDA and  has been authorized for detection and/or diagnosis of SARS-CoV-2 by FDA under an Emergency Use Authorization (EUA). This EUA will remain  in effect (meaning this test can be used) for the duration of the COVID-19 declaration under Section 564(b)(1) of the Act, 21 U.S.C.section 360bbb-3(b)(1), unless the authorization is terminated  or revoked sooner.       Influenza A by PCR NEGATIVE NEGATIVE Final   Influenza B by PCR NEGATIVE NEGATIVE Final    Comment: (NOTE) The Xpert Xpress SARS-CoV-2/FLU/RSV plus assay is intended as an aid in the diagnosis of influenza from Nasopharyngeal swab specimens and should not be used as a sole basis for treatment. Nasal washings and aspirates are unacceptable for Xpert Xpress SARS-CoV-2/FLU/RSV testing.  Fact Sheet for Patients: EntrepreneurPulse.com.au  Fact Sheet for Healthcare Providers: IncredibleEmployment.be  This test is not yet approved or cleared by the [place] FDA and has been authorized for detection and/or diagnosis of SARS-CoV-2 by FDA under an Emergency Use Authorization (EUA). This EUA will remain in effect (meaning this test can be used) for the duration of the COVID-19 declaration under Section 564(b)(1) of the Act, 21 U.S.C. section 360bbb-3(b)(1), unless the authorization is terminated or revoked.  Performed at [facility], [address] [place], [place] [address]   Urine Culture     Status: None   Collection  Time: 12/31/19  9:15 PM   Specimen: Urine, Random  Result Value Ref Range Status   Specimen Description   Final    URINE, RANDOM Performed at [facility], [address], [place], [place] [address]    Special Requests   Final    NONE Performed at [facility], [address] [place], [place] [address]    Culture   Final    NO GROWTH Performed at [facility], [place] [address] [place], [place] [address]    Report Status 01/02/2020 FINAL  Final  C Difficile Quick Screen w PCR reflex     Status: None   Collection Time: 01/01/20  6:09 AM   Specimen: STOOL  Result Value Ref Range Status   C Diff antigen NEGATIVE NEGATIVE Final   C Diff toxin NEGATIVE NEGATIVE Final   C Diff  interpretation No C. difficile detected.  Final    Comment: Performed at [facility], [address] [place], [place] [address]  Gastrointestinal Panel by PCR , Stool     Status: Abnormal   Collection Time: 01/01/20  6:09 AM   Specimen: STOOL  Result Value Ref Range Status   Campylobacter species NOT DETECTED NOT DETECTED Final   Plesimonas shigelloides NOT DETECTED NOT DETECTED Final   Salmonella species NOT DETECTED NOT DETECTED Final   Yersinia enterocolitica NOT DETECTED NOT DETECTED Final   Vibrio species NOT DETECTED NOT DETECTED Final   Vibrio cholerae NOT DETECTED NOT DETECTED Final   Enteroaggregative E coli (EAEC) NOT DETECTED NOT DETECTED Final   Enteropathogenic E coli (EPEC) DETECTED (A) NOT DETECTED Final    Comment: RESULT CALLED TO, READ BACK BY AND VERIFIED WITH: ISABELLA LAPIETRA ON 01/01/20 AT 0751 QSD    Enterotoxigenic E coli (ETEC) NOT DETECTED NOT DETECTED Final   Shiga like toxin producing E coli (STEC) NOT DETECTED NOT DETECTED Final   Shigella/Enteroinvasive E coli (EIEC) NOT DETECTED NOT DETECTED Final   Cryptosporidium NOT DETECTED NOT DETECTED Final   Cyclospora cayetanensis NOT DETECTED NOT DETECTED Final   Entamoeba histolytica NOT DETECTED  NOT DETECTED Final   Giardia lamblia NOT DETECTED NOT DETECTED Final   Adenovirus F40/41 NOT DETECTED NOT DETECTED Final   Astrovirus NOT DETECTED NOT DETECTED Final   Norovirus GI/GII NOT DETECTED NOT DETECTED Final   Rotavirus A NOT DETECTED NOT DETECTED Final   Sapovirus (I, II, IV, and V) NOT DETECTED NOT DETECTED Final    Comment: Performed at [facility], [address] [place], [place] [address]  Blood culture (routine x 2)     Status: None (Preliminary result)   Collection Time: 01/01/20 11:47 AM   Specimen: BLOOD  Result Value Ref Range Status   Specimen Description BLOOD LEFT ARM  Final   Special Requests   Final    BOTTLES DRAWN AEROBIC AND ANAEROBIC Blood Culture results may not be optimal due to an excessive volume of blood received in culture bottles   Culture   Final    NO GROWTH 4 DAYS Performed at [facility], [address] [place], [place] [address]    Report Status PENDING  Incomplete  Blood culture (routine x 2)     Status: None (Preliminary result)   Collection Time: 01/01/20 12:36 PM   Specimen: BLOOD  Result Value Ref Range Status   Specimen Description BLOOD BLOOD RIGHT HAND  Final   Special Requests   Final    BOTTLES DRAWN AEROBIC ONLY Blood Culture results may not be optimal due to an inadequate volume of blood received in culture bottles   Culture   Final    NO GROWTH 4 DAYS Performed at [facility], [address] [place], [place] [address]    Report Status PENDING  Incomplete  MRSA PCR Screening     Status: None   Collection Time: 01/03/20  1:33 PM   Specimen: Nasal Mucosa; Nasopharyngeal  Result Value Ref Range Status   MRSA by PCR NEGATIVE NEGATIVE Final    Comment:        The GeneXpert MRSA Assay (FDA approved for NASAL specimens only), is one component of a comprehensive MRSA colonization surveillance program. It is not intended to diagnose MRSA infection nor to guide or monitor treatment for MRSA  infections. Performed at [facility], [address] [place], [place] [address]  Radiology Studies: ECHOCARDIOGRAM COMPLETE  Result Date: 01/04/2020    ECHOCARDIOGRAM REPORT   Patient Name:   DONAVON Athanas Date of Exam: 01/04/2020 Medical Rec #:  [id number]        Height:       72.0 in Accession #:    [id number]       Weight:       160.3 lb Date of Birth:  [birth year]       BSA:          1.939 m Patient Age:    22 years         BP:           133/85 mmHg Patient Gender: M                HR:           96 bpm. Exam Location:  [facility] Procedure: 2D Echo, Color Doppler, Cardiac Doppler and Intracardiac            Opacification Agent Indications:     I163.9 Stroke  History:         Patient has no prior history of Echocardiogram examinations.                  S/P stroke 3 wks prior; Arrythmias:Tachycardia.  Sonographer:     Charmayne Sheer RDCS (AE) Referring Phys:  [id number] Lang Snow Diagnosing Phys: Ida Rogue MD  Sonographer Comments: Technically difficult study due to poor echo windows. Image acquisition challenging due to respiratory motion. IMPRESSIONS  1. Left ventricular ejection fraction, by estimation, is 60 to 65%. The left ventricle has normal function. The left ventricle has no regional wall motion abnormalities. Left ventricular diastolic parameters are consistent with Grade I diastolic dysfunction (impaired relaxation).  2. Right ventricular systolic function is normal. The right ventricular size is normal. FINDINGS  Left Ventricle: Left ventricular ejection fraction, by estimation, is 60 to 65%. The left ventricle has normal function. The left ventricle has no regional wall motion abnormalities. Definity contrast agent was given IV to delineate the left ventricular  endocardial borders. The left ventricular internal cavity size was normal in size. There is no left ventricular hypertrophy. Left ventricular diastolic parameters are consistent with Grade I diastolic  dysfunction (impaired relaxation). Right Ventricle: The right ventricular size is normal. No increase in right ventricular wall thickness. Right ventricular systolic function is normal. Left Atrium: Left atrial size was normal in size. Right Atrium: Right atrial size was normal in size. Pericardium: There is no evidence of pericardial effusion. Mitral Valve: The mitral valve is normal in structure. No evidence of mitral valve regurgitation. No evidence of mitral valve stenosis. MV peak gradient, 4.5 mmHg. The mean mitral valve gradient is 2.0 mmHg. Tricuspid Valve: The tricuspid valve is normal in structure. Tricuspid valve regurgitation is not demonstrated. No evidence of tricuspid stenosis. Aortic Valve: The aortic valve was not assessed. Aortic valve regurgitation is not visualized. No aortic stenosis is present. Aortic valve mean gradient measures 3.0 mmHg. Aortic valve peak gradient measures 4.7 mmHg. Aortic valve area, by VTI measures 3.08 cm. Pulmonic Valve: The pulmonic valve was normal in structure. Pulmonic valve regurgitation is not visualized. No evidence of pulmonic stenosis. Aorta: The aortic root is normal in size and structure. Venous: The inferior vena cava is normal in size with greater than 50% respiratory variability, suggesting right atrial pressure of 3 mmHg. IAS/Shunts: No atrial level shunt detected by color flow Doppler.  LEFT VENTRICLE PLAX 2D  LVIDd:         4.03 cm  Diastology LVIDs:         3.48 cm  LV e' medial:    7.83 cm/s LV PW:         1.07 cm  LV E/e' medial:  8.1 LV IVS:        0.92 cm  LV e' lateral:   10.20 cm/s LVOT diam:     2.10 cm  LV E/e' lateral: 6.2 LV SV:         59 LV SV Index:   30 LVOT Area:     3.46 cm  LEFT ATRIUM             Index LA diam:        3.80 cm 1.96 cm/m LA Vol (A2C):   37.7 ml 19.44 ml/m LA Vol (A4C):   34.9 ml 18.00 ml/m LA Biplane Vol: 37.7 ml 19.44 ml/m  AORTIC VALVE AV Area (Vmax):    3.24 cm AV Area (Vmean):   3.35 cm AV Area (VTI):     3.08  cm AV Vmax:           108.00 cm/s AV Vmean:          79.100 cm/s AV VTI:            0.191 m AV Peak Grad:      4.7 mmHg AV Mean Grad:      3.0 mmHg LVOT Vmax:         101.00 cm/s LVOT Vmean:        76.500 cm/s LVOT VTI:          0.170 m LVOT/AV VTI ratio: 0.89  AORTA Ao Root diam: 3.20 cm MITRAL VALVE MV Area (PHT): 8.92 cm    SHUNTS MV Peak grad:  4.5 mmHg    Systemic VTI:  0.17 m MV Mean grad:  2.0 mmHg    Systemic Diam: 2.10 cm MV Vmax:       1.06 m/s MV Vmean:      62.2 cm/s MV Decel Time: 85 msec MV E velocity: 63.40 cm/s MV A velocity: 93.00 cm/s MV E/A ratio:  0.68 Ida Rogue MD Electronically signed by Ida Rogue MD Signature Date/Time: 01/04/2020/1:03:51 PM    Final     Scheduled Meds: . aspirin  81 mg Oral Daily  . atorvastatin  80 mg Oral q1800  . Chlorhexidine Gluconate Cloth  6 each Topical Daily  . clopidogrel  75 mg Oral Daily  . cromolyn  2 drop Left Eye QID  . enoxaparin (LOVENOX) injection  40 mg Subcutaneous Q24H  . erythromycin   Left Eye Once  . fluticasone  2 spray Each Nare Daily  . influenza vaccine adjuvanted  0.5 mL Intramuscular Tomorrow-1000  . loratadine  10 mg Oral Daily  . tamsulosin  0.4 mg Oral QPC supper   Continuous Infusions: . sodium chloride 100 mL/hr at 01/05/20 1134     LOS: 5 days   Time spent: 35 minutes.  Desma Maxim, MD [facility]  If 7PM-7AM, please contact night-coverage [email or web address]  01/05/2020, 12:35 PM

## 2020-01-05 NOTE — Progress Notes (Signed)
Pt neuro exam stable overnight. Pt is strict NPO for stroke concern. Requested for normalization (PT, OT, ST) for patient, as he has been stable. Discussed with Dr.Wouk and Dr.Khalidquina. Neuro to come assess pt. Ok for ST.

## 2020-01-05 NOTE — Evaluation (Signed)
Clinical/Bedside Swallow Evaluation Patient Details  Name: Alexander Byrd MRN: [id number] Date of Birth: 25-Mar-1950  Today's Date: 01/05/2020 Time: SLP Start Time (ACUTE ONLY): 1015 SLP Stop Time (ACUTE ONLY): 1100 SLP Time Calculation (min) (ACUTE ONLY): 45 min  Past Medical History: History reviewed. No pertinent past medical history. Past Surgical History: History reviewed. No pertinent surgical history. HPI:  Pt is a 69 y.o. 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 More confusion per family report as well. Patient is altered on arrival and unable to write any verbal history.  He is accompanied by his sister who states that typically his able to speak and only has weakness in his left side.  Unsure of pt's Baseline Cognitive-communication status or functional status for safe po intake, self-feeding as there were no notes in the chart in Care Everywhere from his recent CVA at "[facility]" ~3 weeks ago.    Assessment / Plan / Recommendation Clinical Impression  Pt demonstrated a change in status with possible evolving stroke warranting a new bedside swallow eval. Pt presents with moderate dysphagia with inconsistent s/s of aspiration with nectar thick liquids by spoon (previous diet recommendation). Today, Pt was alert and cooperative although often slow to respond to commands. Noted left facial droop and Pt seemed to favor the left side of the room needing direction to look midline. Slow but adequate manipulation of pureed boluses. Extended time needed to masticate soft solids with mild to moderate oral residue after the swallow. Given nectar thick liquids by TSP, Pt presented with an immediate throat clear with delayed coughing and throat clearing after one of 3  boluses. Rec Dysphagia 1 diet with honey thick liquids by Tsp only. Will follow and alter diet as indicated while Pt is here. Will consider MBSS for further assessment as well once Pt is consistently more alert and able to participate. Pt will also need Speech and language eval at discharge to rehab. Prognosis for improvement is good. SLP Visit Diagnosis: Dysphagia, oropharyngeal phase (R13.12)    Aspiration Risk  Risk for inadequate nutrition/hydration;Mild aspiration risk;Moderate aspiration risk    Diet Recommendation Dysphagia 1 (Puree);Honey-thick liquid   Liquid Administration via: Spoon;Other (Comment) (Honey thcik BY TSP ONLY) Medication Administration: Crushed with puree Supervision: Staff to assist with self feeding Compensations: Minimize environmental distractions;Slow rate;Small sips/bites;Lingual sweep for clearance of pocketing;Follow solids with liquid;Multiple dry swallows after each bite/sip Postural Changes: Seated upright at 90 degrees;Remain upright for at least 30 minutes after po intake    Other  Recommendations Oral Care Recommendations: Oral care BID;Oral care before and after PO;Staff/trained caregiver to provide oral care Other Recommendations: Order thickener from pharmacy;Prohibited food (jello, ice cream, thin soups);Remove water pitcher;Have oral suction available   Follow up Recommendations Skilled Nursing facility      Frequency and Duration min 3x week  2 weeks       Prognosis Prognosis for Safe Diet Advancement: Fair Barriers to Reach Goals: Cognitive deficits;Time post onset;Severity of deficits;Language deficits;Behavior      Swallow Study   General Date of Onset: 12/31/19 HPI: Pt is a 69 y.o. 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 More confusion per family report as well. Patient is altered on arrival and unable to write any verbal history.  He is accompanied by his sister who states that typically his able to speak and only has weakness in his left side.  Unsure of pt's Baseline Cognitive-communication status or functional status for safe po intake, self-feeding as there were no notes in the chart in Care Everywhere from his recent CVA at "[facility]" ~3 weeks ago.  Type of Study: Bedside Swallow Evaluation Previous Swallow Assessment: Bedside swallow eval 12/7 Diet Prior to this Study: NPO Respiratory Status: Room air History of Recent Intubation: No Behavior/Cognition: Cooperative;Pleasant mood;Confused;Distractible;Requires cueing Oral Cavity Assessment: Within Functional Limits Oral Cavity - Dentition: Missing dentition Vision: Impaired for self-feeding Self-Feeding Abilities: Total assist Patient Positioning: Upright in bed Baseline Vocal Quality: Normal    Oral/Motor/Sensory Function Overall Oral Motor/Sensory Function: Generalized oral weakness Facial ROM: Reduced left Facial Symmetry: Abnormal symmetry left Lingual ROM: Within Functional Limits   Ice Chips Ice chips: Within functional limits Presentation: Spoon   Thin Liquid Thin Liquid: Not tested    Nectar Thick Nectar Thick Liquid: Impaired Presentation: Spoon Oral Phase Impairments: Reduced lingual movement/coordination Oral phase functional implications: Prolonged oral transit Pharyngeal Phase Impairments: Suspected delayed Swallow;Cough - Delayed;Throat Clearing - Immediate   Honey Thick     Puree Puree: Impaired Presentation: Spoon Oral Phase Impairments: Reduced lingual movement/coordination Oral Phase Functional Implications: Prolonged oral transit   Solid     Solid: Impaired Presentation: Spoon Oral Phase Impairments: Impaired mastication;Reduced lingual movement/coordination Oral Phase Functional Implications:  Prolonged oral transit      Alexander Byrd 01/05/2020,5:32 PM

## 2020-01-06 LAB — BASIC METABOLIC PANEL
Anion gap: 8 (ref 5–15)
BUN: 10 mg/dL (ref 8–23)
CO2: 22 mmol/L (ref 22–32)
Calcium: 8.9 mg/dL (ref 8.9–10.3)
Chloride: 106 mmol/L (ref 98–111)
Creatinine, Ser: 0.98 mg/dL (ref 0.61–1.24)
GFR, Estimated: >60 mL/min (ref >60)
Glucose, Bld: 111 mg/dL — ABNORMAL HIGH (ref 70–99)
Potassium: 3.2 mmol/L — ABNORMAL LOW (ref 3.5–5.1)
Sodium: 136 mmol/L (ref 135–145)

## 2020-01-06 LAB — MAGNESIUM: Magnesium: 1.6 mg/dL — ABNORMAL LOW (ref 1.7–2.4)

## 2020-01-06 LAB — CULTURE, BLOOD (ROUTINE X 2)
Culture: NO GROWTH
Culture: NO GROWTH

## 2020-01-06 MED ORDER — MAGNESIUM SULFATE 2 GM/50ML IV SOLN
2.0000 g | Freq: Once | INTRAVENOUS | Status: AC
  Administered 2020-01-06: 17:00:00 2 g via INTRAVENOUS
  Filled 2020-01-06: qty 50

## 2020-01-06 MED ORDER — POTASSIUM CHLORIDE 20 MEQ PO PACK
20.0000 meq | PACK | Freq: Two times a day (BID) | ORAL | Status: DC
  Administered 2020-01-06 – 2020-01-07 (×2): 20 meq via ORAL
  Filled 2020-01-06 (×2): qty 1

## 2020-01-06 NOTE — Progress Notes (Addendum)
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 He was noted to have an right eye deviation with twitching eye movements concerning for a possible seizure which prompted an MRI which demosntrated a L Cerebellar PICA stroke which was not as prominent. Repeat MRI Brain demonstrated more obvious DWI changes consistent with a L PICA stroke and associated evolving changes.   Interval Hx   No acute events overnight. More awake today but still slow to respond.  Vitals   Vitals:   01/05/20 2148 01/06/20 0427 01/06/20 0729 01/06/20 1111  BP: (!) 109/97 (!) 125/92 (!) 148/90 (!) 163/92  Pulse: 98 95 92 (!) 102  Resp: 18 18 16 18   Temp: 98.2 F (36.8 C) 98.3 F (36.8 C) 98.6 F (37 C) 99.1 F (37.3 C)  TempSrc: Oral Oral Oral Oral  SpO2: 97% 97% 98% 99%  Weight:  70 kg    Height:         Body mass index is 20.93 kg/m.  Physical Exam   General: Laying comfortably in bed; in no acute distress. HENT: Normal oropharynx and mucosa. Normal external appearance of ears and nose. Neck: Supple, no pain or tenderness CV: No JVD. No peripheral edema. Pulmonary: Symmetric Chest rise. Normal respiratory effort. Abdomen: Soft to touch, non-tender. Ext: No cyanosis, edema, or deformity Skin: No rash. Normal palpation of skin.  Musculoskeletal: Normal digits and nails by inspection. No clubbing.  Neurologic Examination  Mental status/Cognition: Awake, alert, oriented to self, place, birthdate, month. Poor attention. Slow to respond, requires encouragement to participate with exam but he does participate with  stimulation. Speech/language: Dysarthric speech, fluent, comprehension intact, object naming intact, repetition intact. Cranial nerves:   CN II Pupils equal and reactive to light, no VF deficits   CN III,IV,VI EOM intact, no gaze preference or deviation, no nystagmus   CN V normal sensation in V1, V2, and V3 segments bilaterally   CN VII Left upper and lower facial droop that is new from yesterday.   CN VIII normal hearing to speech   CN IX & X    CN XI    CN XII midline tongue protrusion   Motor:  Muscle bulk: poor, tone normal, pronator drift none tremor none Mvmt Root Nerve  Muscle Right Left Comments  SA C5/6 Ax Deltoid 5 5   EF C5/6 Mc Biceps 5 5   EE C6/7/8 Rad Triceps 5 5   WF C6/7 Med FCR 5 5   WE C7/8 PIN ECU 5 5   F Ab C8/T1 U ADM/FDI 5 5   HF L1/2/3 Fem Illopsoas 5 5   KE L2/3/4 Fem Quad     DF L4/5 D Peron Tib Ant 5 5   PF S1/2 Tibial Grc/Sol 5 5    Reflexes:  Right Left Comments  Pectoralis      Biceps (C5/6) 1 1   Brachioradialis (C5/6) 1 1    Triceps (C6/7) 1 1    Patellar (L3/4) 1 1    Achilles (S1)      Hoffman      Plantar     Jaw jerk     Sensation:  Light touch Intact in all extremities  and face.   Pin prick    Temperature    Vibration   Proprioception     Coordination/Complex Motor:  - Finger to Nose with mild ataxia in LUE - Heel to shin with mild ataxia - Rapid alternating movement are slowed bilaterally  NIHSS components Score: Comment  1a Level of Conscious 0[x]  1[]  2[]  3[]      1b LOC Questions 0[x]  1[]  2[]       1c LOC Commands 0[x]  1[]  2[]       2 Best Gaze 0[x]  1[]  2[]       3 Visual 0[x]  1[]  2[]  3[]      4 Facial Palsy 0[]  1[x]  2[]  3[]      5a Motor Arm - left 0[x]  1[]  2[]  3[]  4[]  UN[]    5b Motor Arm - Right 0[x]  1[]  2[]  3[]  4[]  UN[]    6a Motor Leg - Left 0[x]  1[x]  2[]  3[]  4[]  UN[]    6b Motor Leg - Right 0[x]  1[]  2[]  3[]  4[]  UN[]    7 Limb Ataxia 0[]  1[]  2[x]  3[]  UN[]     8 Sensory 0[x]  1[]   2[]  UN[]      9 Best Language 0[x]  1[]  2[]  3[]      10 Dysarthria 0[]  1[x]  2[]  UN[]      11 Extinct. and Inattention 0[x]  1[]  2[]       TOTAL: 5      Labs   Basic Metabolic Panel:  Lab Results  Component Value Date   NA 136 01/06/2020   K 3.2 (L) 01/06/2020   CO2 22 01/06/2020   GLUCOSE 111 (H) 01/06/2020   BUN 10 01/06/2020   CREATININE 0.98 01/06/2020   CALCIUM 8.9 01/06/2020   GFRNONAA >60 01/06/2020   HbA1c:  Lab Results  Component Value Date   HGBA1C 5.9 (H) 01/03/2020   LDL:  Lab Results  Component Value Date   LDLCALC 42 01/03/2020   Urine Drug Screen: No results found for: LABOPIA, COCAINSCRNUR, LABBENZ, AMPHETMU, THCU, LABBARB  Alcohol Level No results found for: ETH No results found for: PHENYTOIN, ZONISAMIDE, LAMOTRIGINE, LEVETIRACETA No results found for: PHENYTOIN, PHENOBARB, VALPROATE, CBMZ  Imaging and Diagnostic studies   MRI Brain  IMPRESSION: 1. Technically limited exam due to extensive motion artifact. 2. Subtly increased diffusion abnormality involving the pons, left middle cerebellar peduncle, and adjacent left cerebellar hemisphere as above. Findings are nonspecific, but could reflect changes related to acute seizure given provided history. Correlation with EEG suggested. Differential considerations include changes related to acute/subacute ischemia versus a nonspecific cerebritis/encephalitis. Correlation with LP and CSF values also suggested as clinically warranted. Changes of PRES are considered, although felt to be less likely given the relative lack of additional changes elsewhere within the brain. 3. No other acute intracranial abnormality. 4. Age-related cerebral atrophy with moderate chronic small vessel ischemic disease, with a few scattered remote lacunar infarcts about the left basal ganglia/corona radiata.  CT Angio head and neck: 1. Fetal type origin of the PCAs bilaterally with associated overall diminutive and severely  diseased vertebrobasilar system. 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 The left PICA is not visualized, likely occluded. Both PCAs and SCAs remain perfused via the anterior circulation. 2. Short-segment moderate stenosis involving the mid right M1 segment. 3. Wide patency of the major arterial vasculature in the neck.  Impression   Erice Ahles is a 69 y.o. male with PMH significant for HTN, HLD, dysphagia, CAD, HTN, prior stroke about 3  weeks ago with residual left hemiparesis whois admitted with EPEC Gastroenteritis withAKI, Tachycardia, tachypnea, elevated lactate and creatinine. MRI with L Cerebellar PICA stroke, CTA with severely diseased vertebrobasilar system with scant flow throgh BL intracranial vertebral arteries and basilar artery with L PICA occlusion. He has BL fetal PCAs.  Permissive HTN was completed for 48 hour with strict bed rest in the ICU. Now on floor with stable exam.  Recommendations  - Q4Hour neuro checks - TTE with EF of 60-65%, no PFO. - LDL 42, continue Atorvastatin 80mg  daily. - HbA1c of 5.9. - Antithrombotic             - continue Plavix 75mg  daily x 3 months, then stop.             - continue Aspirin 81mg  daily - Recommend DVT ppx - SBP goal - gradual normotension. - Telemetry monitoring for arrythmia - Stroke education booklet - Preferably follow up with Dr. with [facility]. - Neurology inpatient team will be available on an as needed basis. Please let know if you would like to see Mr. Panas again.  ____________________________________________________________________  Thank you for the opportunity to take part in the care of this patient. If you have any further questions, please contact the neurology consultation attending.  Signed,  [place] [facility] Pager  Number [place]

## 2020-01-06 NOTE — Evaluation (Signed)
Physical Therapy Evaluation Patient Details Name: Alexander Byrd MRN: [id number] DOB: Jul 20, 1950 Today's Date: 01/06/2020   History of Present Illness  Alexander Byrd is a 69 y.o. 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 MRI with L Cerebellar PICA stroke, CTA with severely diseased vertebrobasilar system with scant flow throgh BL intracranial vertebral arteries and basilar artery with L PICA occlusion. He has BL fetal PCAs.  Clinical Impression  Pt seen for PT evaluation along with co-tx with OT. Pt currently requires mod assist + 2 for bed mobility & min<>max assist for static sitting balance EOB. Pt with decreased reaction & awareness re: LOB when seated EOB, requiring max/total assist to correct. Pt with forward head & rounded shoulders but intermittently able to demonstrate neutral head alignment with cuing. Pt demonstrates BLE knee extensor strength/mm activation & attempts sit<>stand with max assist +2 via three muskateers method with therapist's blocking B knees. Pt engages in grooming tasks with assistance. Pt does demonstrate R inattention but is able to horizontally track R with cuing. Will continue to follow pt acutely to focus on deficits noted.   Recommending CIR level of therapy as pt would benefit from intensive therapies to address deficits noted & maximize independence with functional mobility as pt was independent, active, and working prior to admission.  PT contacted pt's sister Alexander Byrd) who reports pt was independent, working part time prior to initial stroke last month.    Follow Up Recommendations CIR;Supervision for mobility/OOB;Supervision/Assistance - 24 hour    Equipment Recommendations   (TBD in next venue)    Recommendations for Other Services       Precautions /  Restrictions Precautions Precautions: Fall Precaution Comments: R>L hemi, R inattention Restrictions Weight Bearing Restrictions: No      Mobility  Bed Mobility Overal bed mobility: Needs Assistance Bed Mobility: Supine to Sit;Sit to Supine     Supine to sit: Mod assist;+2 for physical assistance;Max assist Sit to supine: Mod assist;Max assist;+2 for physical assistance   General bed mobility comments: Pt able to initiate moving BLE to EOB, assist to upright trunk    Transfers Overall transfer level: Needs assistance Equipment used:  (3 muskateers) Transfers: Sit to/from Stand Sit to Stand: Max assist;+2 physical assistance         General transfer comment: PT/OT supporting BUE, blocking B knees to prevent buckling. Pt able to weight bear through BLE but with great difficulty uprighting trunk.  Ambulation/Gait                Stairs            Wheelchair Mobility    Modified Rankin (Stroke Patients Only)       Balance Overall balance assessment: Needs assistance Sitting-balance support: Bilateral upper extremity supported;Feet supported Sitting balance-Leahy Scale: Poor     Standing balance support: Bilateral upper extremity supported Standing balance-Leahy Scale: Poor Standing balance comment: max assist +2 to attempt standing EOB                             Pertinent Vitals/Pain Pain Assessment: Faces Faces Pain Scale: Hurts a little bit Pain Location: back per ex-wife in room Pain Descriptors / Indicators: Discomfort;Dull;Grimacing Pain Intervention(s): Limited activity within patient's tolerance;Repositioned    Home Living Family/patient expects to be discharged to:: Private  residence Living Arrangements: Spouse/significant other Available Help at Discharge: Friend(s);Available PRN/intermittently Type of Home: Mobile home Home Access: Ramped entrance     Home Layout: One level   Additional Comments: Pt admitted from SNF     Prior Function Level of Independence: Needs assistance   Gait / Transfers Assistance Needed: Pt was Independent, 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 Pt admitted from SNF - unclear level of function at SNF           Hand Dominance   Dominant Hand: Left    Extremity/Trunk Assessment   Upper Extremity Assessment Upper Extremity Assessment: RUE deficits/detail;LUE deficits/detail RUE Deficits / Details: 2/5 grossly. Grip 3/5 LUE Deficits / Details: 2/5 grossly. Grip 3/5    Lower Extremity Assessment Lower Extremity Assessment:  (3-/5 LLE LAQ, 2/5 RLE LAQ, difficult to assess BLE sensation & proprioception)    Cervical / Trunk Assessment Cervical / Trunk Assessment: Kyphotic  Communication   Communication: Expressive difficulties;Receptive difficulties  Cognition Arousal/Alertness: Awake/alert Behavior During Therapy: Flat affect Overall Cognitive Status: Difficult to assess                                 General Comments: Pt intermittently responsive to OT/PT questions by shaking/nodding head. Ex-wife in room reports pt had been communicative c her and he is able to state her name, unable to state his childrens names      General Comments General comments (skin integrity, edema, etc.): R inattention, pt with little verbal communication but does attend to therapist & follow commands with extra time. Pt able to perform BLE LAQ 1 x 10 reps with multimodal cuing    Exercises    Assessment/Plan    PT Assessment Patient needs continued PT services  PT Problem List Decreased strength;Decreased mobility;Decreased safety awareness;Decreased range of motion;Decreased coordination;Decreased knowledge of precautions;Decreased skin integrity;Cardiopulmonary status limiting activity;Decreased activity tolerance;Decreased cognition;Decreased balance;Decreased knowledge of use of DME;Impaired sensation;Pain       PT Treatment  Interventions DME instruction;Therapeutic activities;Cognitive remediation;Modalities;Therapeutic exercise;Gait training;Patient/family education;Stair training;Balance training;Wheelchair mobility training;Functional mobility training;Neuromuscular re-education;Manual techniques    PT Goals (Current goals can be found in the Care Plan section)  Acute Rehab PT Goals Patient Stated Goal: To return to PLOF PT Goal Formulation: With family Time For Goal Achievement: 01/20/20 Potential to Achieve Goals: Fair    Frequency 7X/week   Barriers to discharge Decreased caregiver support      Co-evaluation PT/OT/SLP Co-Evaluation/Treatment: Yes Reason for Co-Treatment: Complexity of the patient's impairments (multi-system involvement);For patient/therapist safety;To address functional/ADL transfers PT goals addressed during session: Mobility/safety with mobility;Balance;Strengthening/ROM OT goals addressed during session: ADL's and self-care       AM-PAC PT "6 Clicks" Mobility  Outcome Measure Help needed turning from your back to your side while in a flat bed without using bedrails?: Total Help needed moving from lying on your back to sitting on the side of a flat bed without using bedrails?: Total Help needed moving to and from a bed to a chair (including a wheelchair)?: Total Help needed standing up from a chair using your arms (e.g., wheelchair or bedside chair)?: Total Help needed to walk in hospital room?: Total Help needed climbing 3-5 steps with a railing? : Total 6 Click Score: 6    End of Session   Activity Tolerance: Patient tolerated treatment well Patient left: in bed;with family/visitor present;with call bell/phone within reach;with  bed alarm set   PT Visit Diagnosis: Muscle weakness (generalized) (M62.81);Other abnormalities of gait and mobility (R26.89);Difficulty in walking, not elsewhere classified (R26.2);Hemiplegia and hemiparesis Hemiplegia - Right/Left:  Right Hemiplegia - dominant/non-dominant: Non-dominant Hemiplegia - caused by: Cerebral infarction    Time: 1432-1450 PT Time Calculation (min) (ACUTE ONLY): 18 min   Charges:   PT Evaluation $PT Eval Moderate Complexity: 1 Mod PT Treatments $Neuromuscular Re-education: 8-22 mins        Aleda Grana, PT, DPT 01/06/20, 4:42 PM   Sandi Mariscal 01/06/2020, 4:38 PM

## 2020-01-06 NOTE — Evaluation (Signed)
Occupational Therapy Evaluation Patient Details Name: Delfino Friesen MRN: [id number] DOB: 09/08/1950 Today's Date: 01/06/2020    History of Present Illness Tobby Fawcett is a 69 y.o. 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 MRI with L Cerebellar PICA stroke, CTA with severely diseased vertebrobasilar system with scant flow throgh BL intracranial vertebral arteries and basilar artery with L PICA occlusion. He has BL fetal PCAs.   Clinical Impression   Mr Palardy was seen for OT/PT co-evaluation this date. Prior to CVA ~3 weeks ago pt was Independent for mobility/ADLs, since CVA pt has been at [facility]. Pt lives c girlfriend in mobile home c ramped entrance. Pt presents to acute OT demonstrating impaired ADL performance and functional mobility 2/2 functional impairment of non-dominant RUE, decreased activity tolerance, and functional strength/balance deficits. Pt intermittently responsive to OT/PT questions by shaking/nodding head. Ex-wife in room reports pt had been communicative c her and he is able to state her name, unable to state his childrens names. Pt demonstrates decreased vertical tracking. Suspected R sided inattention - to be further assessed in functional context.   Pt currently significantly limited by poor trunk control - requires MOD A x2 self-feeding seated EOB (assist for sitting balance and HoH assist for LUE engagement). MOD A face washing seated EOB using RUE - assist for sitting balance only. Pt was motivated t/o OT session. Pt would benefit from skilled OT to address noted impairments and functional limitations (see below for any additional details) in order to maximize safety and independence while minimizing falls risk and caregiver burden.  Upon hospital discharge, recommend pt discharge  to CIR to maximize safety and return to PLOF.     Follow Up Recommendations  CIR    Equipment Recommendations  Other (comment) (TBD)    Recommendations for Other Services       Precautions / Restrictions Precautions Precautions: Fall Restrictions Weight Bearing Restrictions: No      Mobility Bed Mobility Overal bed mobility: Needs Assistance Bed Mobility: Supine to Sit;Sit to Supine     Supine to sit: Mod assist;+2 for physical assistance Sit to supine: Mod assist;+2 for physical assistance        Transfers Overall transfer level: Needs assistance Equipment used: 2 person hand held assist Transfers: Sit to/from Stand Sit to Stand: Max assist;+2 physical assistance              Balance Overall balance assessment: Needs assistance Sitting-balance support: Bilateral upper extremity supported;Feet supported Sitting balance-Leahy Scale: Poor     Standing balance support: Bilateral upper extremity supported Standing balance-Leahy Scale: Poor                             ADL either performed or assessed with clinical judgement   ADL Overall ADL's : Needs assistance/impaired                                       General ADL Comments: MOD A x2 self-feeding seated EOB - assist for sitting balance and HoH assist for LUE. MOD A face washing seated EOB using RUE only- assist for sitting balance.     Vision  Decreased vertical tracking. Suspected R sided inattention - to be further  assessed in functional context.       Perception     Praxis      Pertinent Vitals/Pain Pain Assessment: Faces Faces Pain Scale: Hurts a little bit Pain Location: back per ex-wife in room Pain Descriptors / Indicators: Discomfort;Dull;Grimacing Pain Intervention(s): Limited activity within patient's tolerance;Repositioned     Hand Dominance Left   Extremity/Trunk Assessment Upper Extremity Assessment Upper Extremity Assessment: RUE  deficits/detail;LUE deficits/detail RUE Deficits / Details: 2/5 grossly. Grip 3/5 LUE Deficits / Details: 2/5 grossly. Grip 3/5   Lower Extremity Assessment Lower Extremity Assessment: Generalized weakness       Communication Communication Communication: Expressive difficulties;Receptive difficulties   Cognition Arousal/Alertness: Awake/alert Behavior During Therapy: Flat affect Overall Cognitive Status: Difficult to assess                                 General Comments: Pt intermittently responsive to OT/PT questions by shaking/nodding head. Ex-wife in room reports pt had been communicative c her and he is able to state her name, unable to state his childrens names   General Comments       Exercises Exercises: Other exercises Other Exercises Other Exercises: Pt and ex-wife educated re: OT role, DME recs, d/c recs, falls prevention, ECS, HEP Other Exercises: LBD, self-feeding, sup<>sit, sit<>stand, sitting/standing balance/tolerance   Shoulder Instructions      Home Living Family/patient expects to be discharged to:: Private residence Living Arrangements: Spouse/significant other Available Help at Discharge: Friend(s);Available PRN/intermittently Type of Home: Mobile home Home Access: Ramped entrance                         Additional Comments: Pt admitted from SNF      Prior Functioning/Environment Level of Independence: Needs assistance  Gait / Transfers Assistance Needed: Pt was Independent prior to CVA ~3 weeks ago. Pt admitted from SNF - unclear level of function at SNF              OT Problem List: Decreased strength;Decreased range of motion;Decreased activity tolerance;Impaired balance (sitting and/or standing);Decreased safety awareness;Impaired UE functional use      OT Treatment/Interventions: Self-care/ADL training;Therapeutic exercise;DME and/or AE instruction;Energy conservation;Neuromuscular education;Therapeutic  activities;Balance training;Patient/family education    OT Goals(Current goals can be found in the care plan section) Acute Rehab OT Goals Patient Stated Goal: To return to PLOF OT Goal Formulation: With family Time For Goal Achievement: 01/20/20 Potential to Achieve Goals: Good ADL Goals Pt Will Perform Grooming: with min assist;sitting Pt Will Perform Upper Body Dressing: with mod assist;sitting Pt Will Transfer to Toilet: with mod assist;with +2 assist;stand pivot transfer;bedside commode (c LRAD PRN)  OT Frequency: Min 2X/week   Barriers to D/C:            Co-evaluation PT/OT/SLP Co-Evaluation/Treatment: Yes Reason for Co-Treatment: Complexity of the patient's impairments (multi-system involvement);For patient/therapist safety;To address functional/ADL transfers PT goals addressed during session: Mobility/safety with mobility;Balance OT goals addressed during session: ADL's and self-care      AM-PAC OT "6 Clicks" Daily Activity     Outcome Measure Help from another person eating meals?: A Lot Help from another person taking care of personal grooming?: A Lot Help from another person toileting, which includes using toliet, bedpan, or urinal?: A Lot Help from another person bathing (including washing, rinsing, drying)?: A Lot Help from another person to put on and taking off regular upper body clothing?: A Lot Help  from another person to put on and taking off regular lower body clothing?: A Lot 6 Click Score: 12   End of Session Nurse Communication: Mobility status  Activity Tolerance: Patient tolerated treatment well Patient left: in bed;with call bell/phone within reach;with bed alarm set;with family/visitor present  OT Visit Diagnosis: Other abnormalities of gait and mobility (R26.89);Hemiplegia and hemiparesis Hemiplegia - Right/Left: Right Hemiplegia - dominant/non-dominant: Non-Dominant Hemiplegia - caused by: Cerebral infarction                Time: 1432-1450 OT  Time Calculation (min): 18 min Charges:  OT General Charges $OT Visit: 1 Visit OT Evaluation $OT Eval Moderate Complexity: 1 Mod  Kathie Dike, M.S. OTR/L  01/06/20, 4:35 PM  ascom [phone]

## 2020-01-06 NOTE — Progress Notes (Addendum)
PROGRESS NOTE    Alexander Byrd  KCM:[id number] DOB: Sep 25, 1950 DOA: 12/31/2019 PCP: Jennette Bill., MD   Brief Narrative: Taken from H&P. Alexander Byrd is a 69 y.o. male with medical history significant of recent CVA about 3 weeks ago with residual left-sided weakness and slurred speech (he was admitted at [facility]), has been in an SNF for the past 3 weeks comes to the hospital with complaints of abdominal pain, nausea and vomiting.  Initially met severe sepsis criteria with tachypnea, tachycardia, lactic acidosis with lactic acid peaked at 6, and encephalopathy received IV fluid and broad-spectrum antibiotics.  GI pathogen with pathogenic E. Coli, blood and urine cultures pending. Apparently blood cultures were drawn after starting antibiotics. CT head without any acute changes but MRI brain with some questionable increased diffusion abnormality involving pons, left middle cerebellar peduncle and left cerebellar hemisphere, concern for seizures/encephalitis.  There is 1 nursing concern when patient with persistent right gaze and rapid nystagmus concerning for seizure and he was started on Keppra.  Neurology was consulted. CT abdomen with concern of enteritis. Speech evaluated him and recommending dysphagia 2 diet. AM of 12/8 abnormal MRI showing subacute cerebellar stroke, transferred to ICU for close monitoring, neuro advising no interventions  Subjective: Patient was resting comfortably when seen this morning. More awake and alert, answers simple questions, can sit up in bed, agrees sister is hcpoa.   Assessment & Plan:   Active Problems:   Sepsis ([place])  CVA Ischemic left cerebellar stroke 11/21 hospitalized at [facility], since then dysphagia and left sided weakness. Repeat MRI performed 12/9 with L cerebellar PICA stroke. Seen by neurology, out of window for fibrinolytics, discussed w/ stroke team at [facility] who do not think needs acute intervention at this time. Stable neuro exam.  Transferred to ICU for close neuro monitoring 12/9-12/11, to floor on 12/11. TTE w/ grade 1 dd, o/w unremarkable - neuro following closely, appreciate their recis - cont q4 neuro checks - Continue with aspirin and Lipitor - cont tele - plavix loaded, will need to continue 75 daily for 3 months - pt/ot consulted - SLP evaluated, ok for dysphagia 1 diet for now - bp mildly elevated, holding amlodipine, will slowly resume antihypertensives - d/c IVF, is tolerating PO  Severe sepsis with acute enteritis.  POA. GI pathogen with epec. c diff neg. Lactic acid has normalized. Ct showing enteritis. Urine culture ngtd. Blood cultures also ngtd. Is s/p cefepime/flagyl. Hemodynamically stable. RESOLVED  - s/p 3 days azithromycin - Continue with gentle hydration as above  Encephalopathy. POA Likely 2/2 series of CVAs, acute infection likely contributory. Improving. - monitor  AKI.  Resolved. Creatinine recent [facility] hospitalization trended around 1.1. PO remains poor - Continue to monitor renal function. - Avoid nephrotoxins. - continue IVF  Hypokalemia Hypomagnesemia k 3.2, mg 1.6 - start kcl bid - 2 g mg IV  Partial urinary retention.  CT abdomen with prostatomegaly and bladder scan with more than 400 cc requiring one time in and out catheter.  There is some concern of urinary retention.  That can also contribute to AKI.  Apparently multiple soiled diapers, so most likely partial retention or incomplete emptying of bladder. Does not cooperate for post-void residual scan. - started on flomax - monitor  Essential hypertension.  Blood pressure mildly elevated today - holding amlodipine and lisinopril, resume slowly given recent CVA  Hyperlipidemia. -Continue with statin  Objective: Vitals:   01/05/20 1510 01/05/20 2148 01/06/20 0427 01/06/20 0729  BP: (!) 152/110 Marland Kitchen)  109/97 (!) 125/92 (!) 148/90  Pulse: 91 98 95 92  Resp: _0 Temp: 99.5 F (37.5 C) 98.2 F (36.8 C) 98.3 F  (36.8 C) 98.6 F (37 C)  TempSrc: Oral Oral Oral Oral  SpO2: 98% 97% 97% 98%  Weight:   70 kg   Height:        Intake/Output Summary (Last 24 hours) at 01/06/2020 1012 Last data filed at 01/06/2020 0646 Gross per 24 hour  Intake 616.7 ml  Output 2225 ml  Net -1608.3 ml   Filed Weights   12/31/19 2231 01/03/20 1129 01/06/20 0427  Weight: 74.8 kg 72.7 kg 70 kg    Examination:  General exam: Appears calm and comfortable  Respiratory system: Clear to auscultation. Respiratory effort normal. Cardiovascular system: S1 & S2 heard, RRR. Gastrointestinal system: Soft, nontender, nondistended, bowel sounds positive. Central nervous system: moves all 4 extremities. Persistent r facial droop Extremities: No edema, no cyanosis, pulses intact and symmetrical. Psychiatry: unable to assess   DVT prophylaxis: Lovenox Code Status: Full Family Communication: sister updated @ bedside 12/11 Disposition Plan:  Status is: Inpatient  Remains inpatient appropriate because:Inpatient level of care appropriate due to severity of illness   Dispo: The patient is from: SNF              Anticipated d/c is to: snf though family says they would prefer home if possible              Anticipated d/c date is: 2-5 days              Patient currently is not medically stable to d/c.  Consultants:   Neurology  Procedures:  Antimicrobials:  S/p cefepime/flagyl Azithromycin 12/7>12/9  Data Reviewed: I have personally reviewed following labs and imaging studies  CBC: Recent Labs  Lab 12/31/19 2015 01/01/20 0609 01/02/20 0506  WBC 10.5 7.9 4.9  HGB 14.6 12.6* 12.6*  HCT 42.5 36.6* 36.3*  MCV 89.1 88.4 89.0  PLT 382 254 588   Basic Metabolic Panel: Recent Labs  Lab 01/02/20 0506 01/03/20 0521 01/04/20 0646 01/05/20 0555 01/06/20 0456  NA 137 138 137 135 136  K 3.7 3.4* 3.4* 3.4* 3.2*  CL 110 108 106 106 106  CO2 20* 22 17* 17* 22  GLUCOSE 94 98 72 74 111*  BUN _1 CREATININE 1.19 1.05 0.78 0.90 0.98  CALCIUM 8.8* 8.5* 8.8* 8.9 8.9   GFR: Estimated Creatinine Clearance: 69.4 mL/min (by C-G formula based on SCr of 0.98 mg/dL). Liver Function Tests: Recent Labs  Lab 12/31/19 2015 01/01/20 0609  AST 22 21  ALT 19 16  ALKPHOS 98 82  BILITOT 1.0 1.0  PROT 7.9 6.5  ALBUMIN 3.7 3.1*   Recent Labs  Lab 12/31/19 2015  LIPASE 33   Recent Labs  Lab 12/31/19 2059  AMMONIA 13   Coagulation Profile: Recent Labs  Lab 12/31/19 2048  INR 1.1   Cardiac Enzymes: No results for input(s): CKTOTAL, CKMB, CKMBINDEX, TROPONINI in the last 168 hours. BNP (last 3 results) No results for input(s): PROBNP in the last 8760 hours. HbA1C: No results for input(s): HGBA1C in the last 72 hours. CBG: Recent Labs  Lab 01/03/20 1127 01/03/20 2006  GLUCAP 87 81   Lipid Profile: No results for input(s): CHOL, HDL, LDLCALC, TRIG, CHOLHDL, LDLDIRECT in the last 72 hours. Thyroid Function Tests: No results for input(s): TSH, T4TOTAL, FREET4, T3FREE, THYROIDAB in the last 72 hours.  Anemia Panel: No results for input(s): VITAMINB12, FOLATE, FERRITIN, TIBC, IRON, RETICCTPCT in the last 72 hours. Sepsis Labs: Recent Labs  Lab 12/31/19 2353 01/01/20 0317 01/01/20 0609 01/02/20 0506  LATICACIDVEN 6.0* 4.5* 2.6* 1.0    Recent Results (from the past 240 hour(s))  Resp Panel by RT-PCR (Flu A&B, Covid) Nasopharyngeal Swab     Status: None   Collection Time: 12/31/19  8:48 PM   Specimen: Nasopharyngeal Swab; Nasopharyngeal(NP) swabs in vial transport medium  Result Value Ref Range Status   SARS Coronavirus 2 by RT PCR NEGATIVE NEGATIVE Final    Comment: (NOTE) SARS-CoV-2 target nucleic acids are NOT DETECTED.  The SARS-CoV-2 RNA is generally detectable in upper respiratory specimens during the acute phase of infection. The lowest concentration of SARS-CoV-2 viral copies this assay can detect is 138 copies/mL. A negative result does not preclude  SARS-Cov-2 infection and should not be used as the sole basis for treatment or other patient management decisions. A negative result may occur with  improper specimen collection/handling, submission of specimen other than nasopharyngeal swab, presence of viral mutation(s) within the areas targeted by this assay, and inadequate number of viral copies(<138 copies/mL). A negative result must be combined with clinical observations, patient history, and epidemiological information. The expected result is Negative.  Fact Sheet for Patients:  EntrepreneurPulse.com.au  Fact Sheet for Healthcare Providers:  IncredibleEmployment.be  This test is no t yet approved or cleared by the [place] FDA and  has been authorized for detection and/or diagnosis of SARS-CoV-2 by FDA under an Emergency Use Authorization (EUA). This EUA will remain  in effect (meaning this test can be used) for the duration of the COVID-19 declaration under Section 564(b)(1) of the Act, 21 U.S.C.section 360bbb-3(b)(1), unless the authorization is terminated  or revoked sooner.       Influenza A by PCR NEGATIVE NEGATIVE Final   Influenza B by PCR NEGATIVE NEGATIVE Final    Comment: (NOTE) The Xpert Xpress SARS-CoV-2/FLU/RSV plus assay is intended as an aid in the diagnosis of influenza from Nasopharyngeal swab specimens and should not be used as a sole basis for treatment. Nasal washings and aspirates are unacceptable for Xpert Xpress SARS-CoV-2/FLU/RSV testing.  Fact Sheet for Patients: EntrepreneurPulse.com.au  Fact Sheet for Healthcare Providers: IncredibleEmployment.be  This test is not yet approved or cleared by the [place] FDA and has been authorized for detection and/or diagnosis of SARS-CoV-2 by FDA under an Emergency Use Authorization (EUA). This EUA will remain in effect (meaning this test can be used) for the duration of  the COVID-19 declaration under Section 564(b)(1) of the Act, 21 U.S.C. section 360bbb-3(b)(1), unless the authorization is terminated or revoked.  Performed at [facility], [address], [place], [place] [address]   Urine Culture     Status: None   Collection Time: 12/31/19  9:15 PM   Specimen: Urine, Random  Result Value Ref Range Status   Specimen Description   Final    URINE, RANDOM Performed at [facility], [address], [place], [place] [address]    Special Requests   Final    NONE Performed at [facility], [address], [place], [place] [address]    Culture   Final    NO GROWTH Performed at [facility], [place] [address], [place], [place] [address]    Report Status 01/02/2020 FINAL  Final  C Difficile Quick Screen w PCR reflex     Status: None   Collection  Time: 01/01/20  6:09 AM   Specimen: STOOL  Result Value Ref Range Status   C Diff antigen NEGATIVE NEGATIVE Final   C Diff toxin NEGATIVE NEGATIVE Final   C Diff interpretation No C. difficile detected.  Final    Comment: Performed at [facility], [address] [place], [place] [address]  Gastrointestinal Panel by PCR , Stool     Status: Abnormal   Collection Time: 01/01/20  6:09 AM   Specimen: STOOL  Result Value Ref Range Status   Campylobacter species NOT DETECTED NOT DETECTED Final   Plesimonas shigelloides NOT DETECTED NOT DETECTED Final   Salmonella species NOT DETECTED NOT DETECTED Final   Yersinia enterocolitica NOT DETECTED NOT DETECTED Final   Vibrio species NOT DETECTED NOT DETECTED Final   Vibrio cholerae NOT DETECTED NOT DETECTED Final   Enteroaggregative E coli (EAEC) NOT DETECTED NOT DETECTED Final   Enteropathogenic E coli (EPEC) DETECTED (A) NOT DETECTED Final    Comment: RESULT CALLED TO, READ BACK BY AND VERIFIED WITH: ISABELLA LAPIETRA ON 01/01/20 AT 0751 QSD    Enterotoxigenic E coli (ETEC) NOT DETECTED NOT DETECTED Final   Shiga like  toxin producing E coli (STEC) NOT DETECTED NOT DETECTED Final   Shigella/Enteroinvasive E coli (EIEC) NOT DETECTED NOT DETECTED Final   Cryptosporidium NOT DETECTED NOT DETECTED Final   Cyclospora cayetanensis NOT DETECTED NOT DETECTED Final   Entamoeba histolytica NOT DETECTED NOT DETECTED Final   Giardia lamblia NOT DETECTED NOT DETECTED Final   Adenovirus F40/41 NOT DETECTED NOT DETECTED Final   Astrovirus NOT DETECTED NOT DETECTED Final   Norovirus GI/GII NOT DETECTED NOT DETECTED Final   Rotavirus A NOT DETECTED NOT DETECTED Final   Sapovirus (I, II, IV, and V) NOT DETECTED NOT DETECTED Final    Comment: Performed at [facility], [address] [place], [place] [address]  Blood culture (routine x 2)     Status: None   Collection Time: 01/01/20 11:47 AM   Specimen: BLOOD  Result Value Ref Range Status   Specimen Description BLOOD LEFT ARM  Final   Special Requests   Final    BOTTLES DRAWN AEROBIC AND ANAEROBIC Blood Culture results may not be optimal due to an excessive volume of blood received in culture bottles   Culture   Final    NO GROWTH 5 DAYS Performed at [facility], [address] [place], [place] [address]    Report Status 01/06/2020 FINAL  Final  Blood culture (routine x 2)     Status: None   Collection Time: 01/01/20 12:36 PM   Specimen: BLOOD  Result Value Ref Range Status   Specimen Description BLOOD BLOOD RIGHT HAND  Final   Special Requests   Final    BOTTLES DRAWN AEROBIC ONLY Blood Culture results may not be optimal due to an inadequate volume of blood received in culture bottles   Culture   Final    NO GROWTH 5 DAYS Performed at [facility], [address] [place], [place] [address]    Report Status 01/06/2020 FINAL  Final  MRSA PCR Screening     Status: None   Collection Time: 01/03/20  1:33 PM   Specimen: Nasal Mucosa; Nasopharyngeal  Result Value Ref Range Status   MRSA by PCR NEGATIVE NEGATIVE Final    Comment:         The GeneXpert MRSA Assay (FDA approved for NASAL specimens only), is one component of a comprehensive MRSA colonization  surveillance program. It is not intended to diagnose MRSA infection nor to guide or monitor treatment for MRSA infections. Performed at [facility], [address], [place], [place] [address]      Radiology Studies: No results found.  Scheduled Meds: . aspirin  81 mg Oral Daily  . atorvastatin  80 mg Oral q1800  . Chlorhexidine Gluconate Cloth  6 each Topical Daily  . clopidogrel  75 mg Oral Daily  . cromolyn  2 drop Left Eye QID  . enoxaparin (LOVENOX) injection  40 mg Subcutaneous Q24H  . erythromycin   Left Eye Once  . fluticasone  2 spray Each Nare Daily  . influenza vaccine adjuvanted  0.5 mL Intramuscular Tomorrow-1000  . loratadine  10 mg Oral Daily  . potassium chloride  20 mEq Oral BID AC  . tamsulosin  0.4 mg Oral QPC supper   Continuous Infusions:    LOS: 6 days   Time spent: 35 minutes.  Desma Maxim, MD [facility]  If 7PM-7AM, please contact night-coverage [email or web address]  01/06/2020, 10:12 AM

## 2020-01-07 ENCOUNTER — Encounter: Payer: Self-pay | Admitting: Internal Medicine

## 2020-01-07 LAB — MAGNESIUM: Magnesium: 1.9 mg/dL (ref 1.7–2.4)

## 2020-01-07 LAB — BASIC METABOLIC PANEL
Anion gap: 8 (ref 5–15)
BUN: 16 mg/dL (ref 8–23)
CO2: 24 mmol/L (ref 22–32)
Calcium: 9.1 mg/dL (ref 8.9–10.3)
Chloride: 107 mmol/L (ref 98–111)
Creatinine, Ser: 1.15 mg/dL (ref 0.61–1.24)
GFR, Estimated: >60 mL/min (ref >60)
Glucose, Bld: 119 mg/dL — ABNORMAL HIGH (ref 70–99)
Potassium: 3.3 mmol/L — ABNORMAL LOW (ref 3.5–5.1)
Sodium: 139 mmol/L (ref 135–145)

## 2020-01-07 MED ORDER — POTASSIUM CHLORIDE 20 MEQ PO PACK
40.0000 meq | PACK | Freq: Two times a day (BID) | ORAL | Status: DC
  Administered 2020-01-07 – 2020-01-09 (×4): 40 meq via ORAL
  Filled 2020-01-07 (×4): qty 2

## 2020-01-07 NOTE — Progress Notes (Signed)
Pt visited after breakfast today. Tolerated well. Pt did not want to try any Po's with ST. Continue with current diet. Needs trials of thinner consistencies in hopes of upgrading diet.

## 2020-01-07 NOTE — Care Management Important Message (Signed)
Important Message  Patient Details  Name: Alexander Byrd MRN: [id number] Date of Birth: 02-27-1950   Medicare Important Message Given:  Yes     Johnell Comings 01/07/2020, 12:03 PM

## 2020-01-07 NOTE — Progress Notes (Signed)
Occupational Therapy Treatment Patient Details Name: Alexander Byrd MRN: [id number] DOB: 1950/10/17 Today's Date: 01/07/2020    History of present illness Alexander Byrd is a 69 y.o. 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 MRI with L Cerebellar PICA stroke, CTA with severely diseased vertebrobasilar system with scant flow throgh BL intracranial vertebral arteries and basilar artery with L PICA occlusion. He has BL fetal PCAs.   OT comments  Alexander Byrd was seen for OT/PT co-treatment on this date. Upon arrival to room pt awake c family present at bedside. Pt demonstrated improved mobility and self-feeding from prior session and increased communication. MOD A sup>sit, assist for trunk elevation and squaring to EOB. Pt continues to be significanlty limited by poor trunk control in sitting, requiring MAX A but intermittently only requiring CGA for dynamic sitting (appears to improve balance c BUE exercises).   Completed seated BUE THEREX: 1 set x 10 reps each shoulder flexion, chest press, and passing ball at midline L+R hand. MIN A - CGA self-feeding seated EOB, assist for sitting balance c LUE weight bearing through elbow. Improved functional use of non dominant RUE this date. Pt making good progress toward goals. Pt continues to benefit from skilled OT services to maximize return to PLOF and minimize risk of future falls, injury, caregiver burden, and readmission. Will continue to follow POC. Discharge recommendation remains appropriate.    Follow Up Recommendations  CIR    Equipment Recommendations  Other (comment) (TBD)    Recommendations for Other Services      Precautions / Restrictions Precautions Precautions: Fall Precaution Comments: R>L hemi, R inattention Restrictions Weight Bearing  Restrictions: No       Mobility Bed Mobility Overal bed mobility: Needs Assistance Bed Mobility: Supine to Sit;Sit to Supine     Supine to sit: Mod assist;+2 for physical assistance;HOB elevated Sit to supine: Max assist;+2 for physical assistance   General bed mobility comments: Pt able to initiate moving BLE to EOB, assist to upright trunk  Transfers Overall transfer level: Needs assistance   Transfers: Sit to/from Stand Sit to Stand: Max assist;+2 physical assistance         General transfer comment: LLE buckling in standing and difficulty uprighting trunk. Improved second attempt ~20 seconds    Balance Overall balance assessment: Needs assistance Sitting-balance support: Bilateral upper extremity supported;Feet supported Sitting balance-Leahy Scale: Poor Sitting balance - Comments: SItting balance improves c BUE engagement (therex and self-feeding). L lateral lean requirng MAX A - CGA t/o sitting Postural control: Left lateral lean Standing balance support: Bilateral upper extremity supported Standing balance-Leahy Scale: Zero Standing balance comment: max assist +2 to attempt standing EOB; LLE buckles                           ADL either performed or assessed with clinical judgement   ADL Overall ADL's : Needs assistance/impaired                                       General ADL Comments: MIN A x2 self feeding c RUE seated EOB - assist for balance and to hold cup. MOD A face washing using LUE seated EOB - assist for sitting balance.  Cognition Arousal/Alertness: Awake/alert Behavior During Therapy: Flat affect Overall Cognitive Status: Impaired/Different from baseline Area of Impairment: Following commands;Problem solving                       Following Commands: Follows one step commands with increased time     Problem Solving: Decreased initiation;Requires verbal cues;Requires tactile cues General  Comments: Improved communication from prior session. Tactile cues for UE/LE exercises        Exercises Exercises: Other exercises Other Exercises Other Exercises: Pt and girlfriend educated re: OT role, DME recs, d/c recs, falls prevention, ECS, HEP, streoke educations, neuro re-ed Other Exercises: Self-feeding, sup<>sit, sit<>stand x2, sitting/standing balance/tolerance, face washing Other Exercises: Seated BUE THEREX: 1 set x 10 reps each shoulder flexion, chest press, ball pass at midline L+R hand           Pertinent Vitals/ Pain       Pain Assessment: No/denies pain         Frequency  Min 2X/week        Progress Toward Goals  OT Goals(current goals can now be found in the care plan section)  Progress towards OT goals: Progressing toward goals  Acute Rehab OT Goals Patient Stated Goal: To return to PLOF OT Goal Formulation: With family Time For Goal Achievement: 01/20/20 Potential to Achieve Goals: Good ADL Goals Pt Will Perform Grooming: with min assist;sitting Pt Will Perform Upper Body Dressing: with mod assist;sitting Pt Will Transfer to Toilet: with mod assist;with +2 assist;stand pivot transfer;bedside commode  Plan Discharge plan remains appropriate;Frequency remains appropriate    Co-evaluation    PT/OT/SLP Co-Evaluation/Treatment: Yes Reason for Co-Treatment: To address functional/ADL transfers;For patient/therapist safety PT goals addressed during session: Mobility/safety with mobility;Strengthening/ROM;Balance OT goals addressed during session: ADL's and self-care;Strengthening/ROM      AM-PAC OT "6 Clicks" Daily Activity     Outcome Measure   Help from another person eating meals?: A Little Help from another person taking care of personal grooming?: A Little Help from another person toileting, which includes using toliet, bedpan, or urinal?: A Lot Help from another person bathing (including washing, rinsing, drying)?: A Lot Help from another  person to put on and taking off regular upper body clothing?: A Lot Help from another person to put on and taking off regular lower body clothing?: A Lot 6 Click Score: 14    End of Session    OT Visit Diagnosis: Other abnormalities of gait and mobility (R26.89);Hemiplegia and hemiparesis Hemiplegia - Right/Left: Right Hemiplegia - dominant/non-dominant: Non-Dominant Hemiplegia - caused by: Cerebral infarction   Activity Tolerance Patient tolerated treatment well   Patient Left in bed;with call bell/phone within reach;with bed alarm set;with family/visitor present   Nurse Communication          Time: 1315-1340 OT Time Calculation (min): 25 min  Charges: OT General Charges $OT Visit: 1 Visit OT Treatments $Self Care/Home Management : 8-22 mins  Kathie Dike, M.S. OTR/L  01/07/20, 2:38 PM  ascom [phone]

## 2020-01-07 NOTE — Progress Notes (Signed)
Physical Therapy Treatment Patient Details Name: Alexander Byrd MRN: [id number] DOB: [birth year] Today's Date: 01/07/2020    History of Present Illness Alexander Byrd is a 69 y.o. 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 MRI with L Cerebellar PICA stroke, CTA with severely diseased vertebrobasilar system with scant flow throgh BL intracranial vertebral arteries and basilar artery with L PICA occlusion. He has BL fetal PCAs.    PT Comments    Co-tx with OT  Only one unit billed each per discipline.   To EOB with mod a x 2.  Pt sat EOB for an extended period of time.  He is generally able to sit with max a x 1 but at times he only requires min guard for brief periods.  Mod verbal and tactile cues to correct imbalances and sit up straight.  Hangs had down despite cues to pick it up but does look left/right with cues.  Seated LE ex for LAQ and marches.  OT tasks per OT note.  Standing x 2 is trialed.  He is able to stand briefly with max a x 2 and blocking of R knee.  Unable to step or progress gait from bed.  Returned to supine with max a x 2 and repositioned for comfort.  He is able to assist with rolling left/right and does make good effort to reach for rails to assist.   Follow Up Recommendations  CIR;Supervision for mobility/OOB;Supervision/Assistance - 24 hour     Equipment Recommendations       Recommendations for Other Services       Precautions / Restrictions Precautions Precautions: Fall Precaution Comments: R>L hemi, R inattention Restrictions Weight Bearing Restrictions: No    Mobility  Bed Mobility Overal bed mobility: Needs Assistance Bed Mobility: Supine to Sit;Sit to Supine     Supine to sit: Mod assist;+2 for physical assistance;HOB elevated Sit to supine: Max assist;+2  for physical assistance   General bed mobility comments: Pt able to initiate moving BLE to EOB, assist to upright trunk  Transfers Overall transfer level: Needs assistance   Transfers: Sit to/from Stand Sit to Stand: Max assist;+2 physical assistance         General transfer comment: LLE buckling in standing and difficulty uprighting trunk. Improved second attempt ~20 seconds  Ambulation/Gait                 Stairs             Wheelchair Mobility    Modified Rankin (Stroke Patients Only)       Balance Overall balance assessment: Needs assistance Sitting-balance support: Bilateral upper extremity supported;Feet supported Sitting balance-Leahy Scale: Poor Sitting balance - Comments: SItting balance improves c BUE engagement (therex and self-feeding). L lateral lean requirng MAX A - CGA t/o sitting Postural control: Left lateral lean Standing balance support: Bilateral upper extremity supported Standing balance-Leahy Scale: Zero Standing balance comment: max assist +2 to attempt standing EOB; LLE buckles                            Cognition Arousal/Alertness: Awake/alert Behavior During Therapy: Flat affect Overall Cognitive Status: Impaired/Different from baseline Area of Impairment: Following commands;Problem solving  Following Commands: Follows one step commands with increased time     Problem Solving: Decreased initiation;Requires verbal cues;Requires tactile cues General Comments: Improved communication from prior session. Tactile cues for UE/LE exercises      Exercises Other Exercises Other Exercises: Pt and girlfriend educated re: OT role, DME recs, d/c recs, falls prevention, ECS, HEP, streoke educations, neuro re-ed Other Exercises: Self-feeding, sup<>sit, sit<>stand x2, sitting/standing balance/tolerance, face washing Other Exercises: Seated BUE THEREX: 1 set x 10 reps each shoulder flexion, chest  press, ball pass at midline L+R hand    General Comments        Pertinent Vitals/Pain Pain Assessment: No/denies pain    Home Living                      Prior Function            PT Goals (current goals can now be found in the care plan section) Acute Rehab PT Goals Patient Stated Goal: To return to PLOF Progress towards PT goals: Progressing toward goals    Frequency    7X/week      PT Plan Current plan remains appropriate    Co-evaluation PT/OT/SLP Co-Evaluation/Treatment: Yes Reason for Co-Treatment: Complexity of the patient's impairments (multi-system involvement) PT goals addressed during session: Mobility/safety with mobility;Strengthening/ROM;Balance OT goals addressed during session: Strengthening/ROM;ADL's and self-care      AM-PAC PT "6 Clicks" Mobility   Outcome Measure  Help needed turning from your back to your side while in a flat bed without using bedrails?: Total Help needed moving from lying on your back to sitting on the side of a flat bed without using bedrails?: Total Help needed moving to and from a bed to a chair (including a wheelchair)?: Total Help needed standing up from a chair using your arms (e.g., wheelchair or bedside chair)?: Total Help needed to walk in hospital room?: Total Help needed climbing 3-5 steps with a railing? : Total 6 Click Score: 6    End of Session   Activity Tolerance: Patient tolerated treatment well Patient left: in bed;with family/visitor present;with call bell/phone within reach;with bed alarm set   PT Visit Diagnosis: Muscle weakness (generalized) (M62.81);Other abnormalities of gait and mobility (R26.89);Difficulty in walking, not elsewhere classified (R26.2);Hemiplegia and hemiparesis Hemiplegia - Right/Left: Right Hemiplegia - dominant/non-dominant: Non-dominant Hemiplegia - caused by: Cerebral infarction     Time: 1315-1340 PT Time Calculation (min) (ACUTE ONLY): 25 min  Charges:   $Therapeutic Exercise: 8-22 mins                    Danielle Dess, PTA 01/07/20, 3:11 PM

## 2020-01-07 NOTE — Progress Notes (Signed)
PROGRESS NOTE    Alexander Byrd  OLP:[id number] DOB: 02-23-1950 DOA: 12/31/2019 PCP: Collier Bullock., MD   Brief Narrative: Taken from H&P. Alexander Byrd is a 69 y.o. 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  Initially met severe sepsis criteria with tachypnea, tachycardia, lactic acidosis with lactic acid peaked at 6, and encephalopathy received IV fluid and broad-spectrum antibiotics.  GI pathogen with pathogenic E. Coli, blood and urine cultures pending. Apparently blood cultures were drawn after starting antibiotics. CT head without any acute changes but MRI brain with some questionable increased diffusion abnormality involving pons, left middle cerebellar peduncle and left cerebellar hemisphere, concern for seizures/encephalitis.  There is 1 nursing concern when patient with persistent right gaze and rapid nystagmus concerning for seizure and he was started on Keppra.  Neurology was consulted. CT abdomen with concern of enteritis. Speech evaluated him and recommending dysphagia 2 diet. AM of 12/8 abnormal MRI showing subacute cerebellar stroke, transferred to ICU for close monitoring, neuro advising no interventions  Subjective: Patient was resting comfortably when seen this morning. More awake and alert, answers simple questions, can sit up in bed, agrees sister is hcpoa.   Assessment & Plan:   Active Problems:   Sepsis (HCC)  CVA Ischemic left cerebellar stroke 11/21 hospitalized at [facility], since then dysphagia and left sided weakness. Repeat MRI performed 12/9 with L cerebellar PICA stroke. Seen by neurology, out of window for fibrinolytics, discussed w/ stroke team at [facility] who do not think needs acute intervention at this time. Stable neuro exam.  Transferred to ICU for close neuro monitoring 12/9-12/11, to floor on 12/11. TTE w/ grade 1 dd, o/w unremarkable - neuro following closely, appreciate their recis - Continue with aspirin and Lipitor - cont tele - plavix loaded, will need to continue 75 daily for 3 months - pt/ot consulted, advising inpatient rehab, has been seen by rehab rn thinks good candidate, will discuss this w/ his sister - SLP evaluated, ok for dysphagia 1 diet for now - bp mildly elevated, holding amlodipine, will slowly resume antihypertensives - d/c IVF, is tolerating PO - neuro advising outpt f/u with dedicated stroke specialist such as dr. Pearlean Brownie @ [facility]  Severe sepsis with acute enteritis.  POA. GI pathogen with epec. c diff neg. Lactic acid has normalized. Ct showing enteritis. Urine culture ngtd. Blood cultures also ngtd. Is s/p cefepime/flagyl. Hemodynamically stable. RESOLVED  - s/p 3 days azithromycin - Continue with gentle hydration as above  Encephalopathy. POA Likely 2/2 series of CVAs, acute infection likely contributory. Improving. - monitor  AKI.  Resolved. Creatinine recent [facility] hospitalization trended around 1.1. PO remains poor - Continue to monitor renal function. - Avoid nephrotoxins. - continue IVF  Hypokalemia Hypomagnesemia k 3.3, mg 1.9 - increase kcl to 40 bid - monitor  Partial urinary retention.  CT abdomen with prostatomegaly and bladder scan with more than 400 cc requiring one time in and out catheter.  There is some concern of urinary retention.  That can also contribute to AKI.  Apparently multiple soiled diapers, so most likely partial retention or incomplete emptying of bladder. Does not cooperate for post-void residual scan. - started on flomax - monitor  Essential hypertension.  Blood pressure mildly elevated today - holding amlodipine and  lisinopril, resume slowly given recent CVA  Hyperlipidemia. -Continue with statin  Objective: Vitals:   01/07/20  0402 01/07/20 0610 01/07/20 0717 01/07/20 1109  BP: 138/80  127/90 130/83  Pulse: 85  92 94  Resp: $Remo'16  17 16  'BcLnr$ Temp: 98.5 F (36.9 C)  99 F (37.2 C) 98.6 F (37 C)  TempSrc: Oral  Oral Oral  SpO2: 97%  96% 98%  Weight:  69.9 kg    Height:        Intake/Output Summary (Last 24 hours) at 01/07/2020 1518 Last data filed at 01/07/2020 1423 Gross per 24 hour  Intake 120 ml  Output 175 ml  Net -55 ml   Filed Weights   01/03/20 1129 01/06/20 0427 01/07/20 0610  Weight: 72.7 kg 70 kg 69.9 kg    Examination:  General exam: Appears calm and comfortable  Respiratory system: Clear to auscultation. Respiratory effort normal. Cardiovascular system: S1 & S2 heard, RRR. Gastrointestinal system: Soft, nontender, nondistended, bowel sounds positive. Central nervous system: moves all 4 extremities. No sig facial droop Extremities: No edema, no cyanosis, pulses intact and symmetrical. Psychiatry: no agigation   DVT prophylaxis: Lovenox Code Status: Full Family Communication: sister updated @ bedside 12/11 Disposition Plan:  Status is: Inpatient  Remains inpatient appropriate because:Inpatient level of care appropriate due to severity of illness   Dispo: The patient is from: snf              Anticipated d/c is to: inpt rehab              Anticipated d/c date is: 2-5 days              Patient currently is not medically stable to d/c.  Consultants:   Neurology  Procedures:  Antimicrobials:  S/p cefepime/flagyl Azithromycin 12/7>12/9  Data Reviewed: I have personally reviewed following labs and imaging studies  CBC: Recent Labs  Lab 12/31/19 2015 01/01/20 0609 01/02/20 0506  WBC 10.5 7.9 4.9  HGB 14.6 12.6* 12.6*  HCT 42.5 36.6* 36.3*  MCV 89.1 88.4 89.0  PLT 382 254 458   Basic Metabolic Panel: Recent Labs  Lab 01/03/20 0521 01/04/20 0646 01/05/20 0555 01/06/20 0456 01/07/20 0548  NA 138 137 135 136 139  K 3.4* 3.4* 3.4* 3.2* 3.3*  CL 108 106 106 106 107   CO2 22 17* 17* 22 24  GLUCOSE 98 72 74 111* 119*  BUN $Re'12 10 11 10 16  'FXa$ CREATININE 1.05 0.78 0.90 0.98 1.15  CALCIUM 8.5* 8.8* 8.9 8.9 9.1  MG  --   --   --  1.6* 1.9   GFR: Estimated Creatinine Clearance: 59.1 mL/min (by C-G formula based on SCr of 1.15 mg/dL). Liver Function Tests: Recent Labs  Lab 12/31/19 2015 01/01/20 0609  AST 22 21  ALT 19 16  ALKPHOS 98 82  BILITOT 1.0 1.0  PROT 7.9 6.5  ALBUMIN 3.7 3.1*   Recent Labs  Lab 12/31/19 2015  LIPASE 33   Recent Labs  Lab 12/31/19 2059  AMMONIA 13   Coagulation Profile: Recent Labs  Lab 12/31/19 2048  INR 1.1   Cardiac Enzymes: No results for input(s): CKTOTAL, CKMB, CKMBINDEX, TROPONINI in the last 168 hours. BNP (last 3 results) No results for input(s): PROBNP in the last 8760 hours. HbA1C: No results for input(s): HGBA1C in the last 72 hours. CBG: Recent Labs  Lab 01/03/20 1127 01/03/20 2006  GLUCAP 87 81   Lipid Profile: No results for input(s): CHOL, HDL,  LDLCALC, TRIG, CHOLHDL, LDLDIRECT in the last 72 hours. Thyroid Function Tests: No results for input(s): TSH, T4TOTAL, FREET4, T3FREE, THYROIDAB in the last 72 hours. Anemia Panel: No results for input(s): VITAMINB12, FOLATE, FERRITIN, TIBC, IRON, RETICCTPCT in the last 72 hours. Sepsis Labs: Recent Labs  Lab 12/31/19 2353 01/01/20 0317 01/01/20 0609 01/02/20 0506  LATICACIDVEN 6.0* 4.5* 2.6* 1.0    Recent Results (from the past 240 hour(s))  Resp Panel by RT-PCR (Flu A&B, Covid) Nasopharyngeal Swab     Status: None   Collection Time: 12/31/19  8:48 PM   Specimen: Nasopharyngeal Swab; Nasopharyngeal(NP) swabs in vial transport medium  Result Value Ref Range Status   SARS Coronavirus 2 by RT PCR NEGATIVE NEGATIVE Final    Comment: (NOTE) SARS-CoV-2 target nucleic acids are NOT DETECTED.  The SARS-CoV-2 RNA is generally detectable in upper respiratory specimens during the acute phase of infection. The lowest concentration of  SARS-CoV-2 viral copies this assay can detect is 138 copies/mL. A negative result does not preclude SARS-Cov-2 infection and should not be used as the sole basis for treatment or other patient management decisions. A negative result may occur with  improper specimen collection/handling, submission of specimen other than nasopharyngeal swab, presence of viral mutation(s) within the areas targeted by this assay, and inadequate number of viral copies(<138 copies/mL). A negative result must be combined with clinical observations, patient history, and epidemiological information. The expected result is Negative.  Fact Sheet for Patients:  BloggerCourse.com  Fact Sheet for Healthcare Providers:  SeriousBroker.it  This test is no t yet approved or cleared by the [place] FDA and  has been authorized for detection and/or diagnosis of SARS-CoV-2 by FDA under an Emergency Use Authorization (EUA). This EUA will remain  in effect (meaning this test can be used) for the duration of the COVID-19 declaration under Section 564(b)(1) of the Act, 21 U.S.C.section 360bbb-3(b)(1), unless the authorization is terminated  or revoked sooner.       Influenza A by PCR NEGATIVE NEGATIVE Final   Influenza B by PCR NEGATIVE NEGATIVE Final    Comment: (NOTE) The Xpert Xpress SARS-CoV-2/FLU/RSV plus assay is intended as an aid in the diagnosis of influenza from Nasopharyngeal swab specimens and should not be used as a sole basis for treatment. Nasal washings and aspirates are unacceptable for Xpert Xpress SARS-CoV-2/FLU/RSV testing.  Fact Sheet for Patients: BloggerCourse.com  Fact Sheet for Healthcare Providers: SeriousBroker.it  This test is not yet approved or cleared by the [place] FDA and has been authorized for detection and/or diagnosis of SARS-CoV-2 by FDA under an Emergency Use  Authorization (EUA). This EUA will remain in effect (meaning this test can be used) for the duration of the COVID-19 declaration under Section 564(b)(1) of the Act, 21 U.S.C. section 360bbb-3(b)(1), unless the authorization is terminated or revoked.  Performed at [facility], [address], [place], [place] [address]   Urine Culture     Status: None   Collection Time: 12/31/19  9:15 PM   Specimen: Urine, Random  Result Value Ref Range Status   Specimen Description   Final    URINE, RANDOM Performed at [facility], [address], [place], [place] [address]    Special Requests   Final    NONE Performed at [facility], [address] [place], [place] [address]    Culture   Final    NO GROWTH Performed at [facility], [address] [place], [place] [address]  Report Status 01/02/2020 FINAL  Final  C Difficile Quick Screen w PCR reflex     Status: None   Collection Time: 01/01/20  6:09 AM   Specimen: STOOL  Result Value Ref Range Status   C Diff antigen NEGATIVE NEGATIVE Final   C Diff toxin NEGATIVE NEGATIVE Final   C Diff interpretation No C. difficile detected.  Final    Comment: Performed at [facility], [address] [place], [place] [address]  Gastrointestinal Panel by PCR , Stool     Status: Abnormal   Collection Time: 01/01/20  6:09 AM   Specimen: STOOL  Result Value Ref Range Status   Campylobacter species NOT DETECTED NOT DETECTED Final   Plesimonas shigelloides NOT DETECTED NOT DETECTED Final   Salmonella species NOT DETECTED NOT DETECTED Final   Yersinia enterocolitica NOT DETECTED NOT DETECTED Final   Vibrio species NOT DETECTED NOT DETECTED Final   Vibrio cholerae NOT DETECTED NOT DETECTED Final   Enteroaggregative E coli (EAEC) NOT DETECTED NOT DETECTED Final   Enteropathogenic E coli (EPEC) DETECTED (A) NOT DETECTED Final    Comment: RESULT CALLED TO, READ BACK BY AND VERIFIED WITH: ISABELLA LAPIETRA  ON 01/01/20 AT 0751 QSD    Enterotoxigenic E coli (ETEC) NOT DETECTED NOT DETECTED Final   Shiga like toxin producing E coli (STEC) NOT DETECTED NOT DETECTED Final   Shigella/Enteroinvasive E coli (EIEC) NOT DETECTED NOT DETECTED Final   Cryptosporidium NOT DETECTED NOT DETECTED Final   Cyclospora cayetanensis NOT DETECTED NOT DETECTED Final   Entamoeba histolytica NOT DETECTED NOT DETECTED Final   Giardia lamblia NOT DETECTED NOT DETECTED Final   Adenovirus F40/41 NOT DETECTED NOT DETECTED Final   Astrovirus NOT DETECTED NOT DETECTED Final   Norovirus GI/GII NOT DETECTED NOT DETECTED Final   Rotavirus A NOT DETECTED NOT DETECTED Final   Sapovirus (I, II, IV, and V) NOT DETECTED NOT DETECTED Final    Comment: Performed at [facility], [address] [place], [place] [address]  Blood culture (routine x 2)     Status: None   Collection Time: 01/01/20 11:47 AM   Specimen: BLOOD  Result Value Ref Range Status   Specimen Description BLOOD LEFT ARM  Final   Special Requests   Final    BOTTLES DRAWN AEROBIC AND ANAEROBIC Blood Culture results may not be optimal due to an excessive volume of blood received in culture bottles   Culture   Final    NO GROWTH 5 DAYS Performed at [facility], [address] [place], [place] [address]    Report Status 01/06/2020 FINAL  Final  Blood culture (routine x 2)     Status: None   Collection Time: 01/01/20 12:36 PM   Specimen: BLOOD  Result Value Ref Range Status   Specimen Description BLOOD BLOOD RIGHT HAND  Final   Special Requests   Final    BOTTLES DRAWN AEROBIC ONLY Blood Culture results may not be optimal due to an inadequate volume of blood received in culture bottles   Culture   Final    NO GROWTH 5 DAYS Performed at [facility], [address], [place],  [address]    Report Status 01/06/2020 FINAL  Final  MRSA PCR Screening     Status: None   Collection Time: 01/03/20  1:33 PM   Specimen: Nasal  Mucosa; Nasopharyngeal  Result Value Ref Range Status   MRSA by PCR NEGATIVE NEGATIVE Final    Comment:  The GeneXpert MRSA Assay (FDA approved for NASAL specimens only), is one component of a comprehensive MRSA colonization surveillance program. It is not intended to diagnose MRSA infection nor to guide or monitor treatment for MRSA infections. Performed at [facility], [address] [place], [place] [address]      Radiology Studies: No results found.  Scheduled Meds: . aspirin  81 mg Oral Daily  . atorvastatin  80 mg Oral q1800  . Chlorhexidine Gluconate Cloth  6 each Topical Daily  . clopidogrel  75 mg Oral Daily  . cromolyn  2 drop Left Eye QID  . enoxaparin (LOVENOX) injection  40 mg Subcutaneous Q24H  . fluticasone  2 spray Each Nare Daily  . influenza vaccine adjuvanted  0.5 mL Intramuscular Tomorrow-1000  . loratadine  10 mg Oral Daily  . potassium chloride  20 mEq Oral BID AC  . tamsulosin  0.4 mg Oral QPC supper   Continuous Infusions:    LOS: 7 days   Time spent: 35 minutes.  Desma Maxim, MD [facility]  If 7PM-7AM, please contact night-coverage [email or web address]  01/07/2020, 3:18 PM

## 2020-01-07 NOTE — Progress Notes (Signed)
Inpatient Rehabilitation Admissions Coordinator  Inpatient rehabilitation Consult received I reviewed his chart and spoke with his sister, [name] by phone. We discussed goals and expectations of a possible Cir admit at [facility] in [place]. She is arranging a conversation with hospitalist, D. Wouk , for tomorrow to discuss his overall progress and medical plan of care. She will then be able to further discuss with her brother his overall care needs and rehab venue options. She is interested in [facility] CIR admit pending that discussion with MD and her brother tomorrow afternoon. I will follow up with patient , acute team and sister, [name], at that time. I feel he is a good candidate for possible Cir admit .  Ottie Glazier, RN, MSN Rehab Admissions Coordinator [phone] 01/07/2020 2:01 PM

## 2020-01-07 NOTE — Progress Notes (Signed)
Inpatient Rehab Admissions Coordinator Note:   Per therapy recommendations, pt was screened for CIR candidacy by Lataisha Colan, MS CCC-SLP. At this time, Pt. Appears to have functional decline and is a good candidate for CIR. Will place order for rehab consult per protocol.  Please contact me with questions.   Xzavian Semmel, MS, CCC-SLP Rehab Admissions Coordinator  [phone] (celll) [phone] (office)  

## 2020-01-08 DIAGNOSIS — I63542 Cerebral infarction due to unspecified occlusion or stenosis of left cerebellar artery: Principal | ICD-10-CM

## 2020-01-08 LAB — BASIC METABOLIC PANEL
Anion gap: 8 (ref 5–15)
BUN: 19 mg/dL (ref 8–23)
CO2: 22 mmol/L (ref 22–32)
Calcium: 9 mg/dL (ref 8.9–10.3)
Chloride: 111 mmol/L (ref 98–111)
Creatinine, Ser: 1.06 mg/dL (ref 0.61–1.24)
GFR, Estimated: >60 mL/min (ref >60)
Glucose, Bld: 116 mg/dL — ABNORMAL HIGH (ref 70–99)
Potassium: 3.4 mmol/L — ABNORMAL LOW (ref 3.5–5.1)
Sodium: 141 mmol/L (ref 135–145)

## 2020-01-08 LAB — MAGNESIUM: Magnesium: 1.8 mg/dL (ref 1.7–2.4)

## 2020-01-08 MED ORDER — AMLODIPINE BESYLATE 5 MG PO TABS
5.0000 mg | ORAL_TABLET | Freq: Every day | ORAL | Status: DC
  Administered 2020-01-08 – 2020-01-09 (×2): 5 mg via ORAL
  Filled 2020-01-08 (×2): qty 1

## 2020-01-08 MED ORDER — MAGNESIUM SULFATE IN D5W 1-5 GM/100ML-% IV SOLN
1.0000 g | Freq: Once | INTRAVENOUS | Status: AC
  Administered 2020-01-08: 15:00:00 1 g via INTRAVENOUS
  Filled 2020-01-08: qty 100

## 2020-01-08 NOTE — Consult Note (Signed)
Physical Medicine and Rehabilitation Consult Reason for Consult: Debility 2/2 sepsis Referring Physician: Kathrynn Running, MD   HPI: Alexander Byrd is a 69 y.o. 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 He has been in a SNF for the past three weeks and was admitted to [facility] with abdominal pain, nausea, and vomiting and is being treated for sepsis secondary to E.Coli. MRI brain shows increased diffusion abnormality involving the pons, left middle cerebellar peduncle, and left cerebella hemisphere, concerning for seizures/encephallitis. Physical Medicine & Rehabilitation was consulted to assess candidacy for CIR given debility. Currently with short standing tolerance, disorientation, 2 person Min-ModA for transfers.    Review of Systems  Constitutional: Negative.   HENT: Negative.   Eyes: Negative.   Respiratory: Negative.   Cardiovascular: Negative.   Gastrointestinal: Negative.   Genitourinary: Negative.   Musculoskeletal: Negative.   Skin: Negative.   Neurological: Positive for weakness.  Endo/Heme/Allergies: Negative.   Psychiatric/Behavioral: Positive for memory loss.   History reviewed. No pertinent past medical history. History reviewed. No pertinent surgical history. History reviewed. No pertinent family history. Social History:  has an unknown smoking status. He has never used smokeless tobacco. He reports previous alcohol use. He reports previous drug use. Allergies: No Known Allergies Medications Prior to Admission  Medication Sig Dispense Refill  . Amino Acids-Protein Hydrolys (FEEDING SUPPLEMENT, PRO-STAT SUGAR FREE 64,) LIQD Take 30 mLs by mouth daily.    Marland Kitchen amLODipine (NORVASC) 5 MG tablet Take 5 mg by mouth daily.    Marland Kitchen aspirin 81 MG chewable tablet Chew 81 mg by mouth daily.    Marland Kitchen atorvastatin (LIPITOR) 80 MG tablet Take 80 mg by mouth daily at 6 PM.    .  cromolyn (OPTICROM) 4 % ophthalmic solution Place 2 drops into the left eye 4 (four) times daily. 0600, 1200, 1800, 0000    . fluticasone (FLONASE) 50 MCG/ACT nasal spray Place 2 sprays into both nostrils daily.    Marland Kitchen lisinopril (ZESTRIL) 40 MG tablet Take 40 mg by mouth daily.    Marland Kitchen loratadine (CLARITIN) 10 MG tablet Take 10 mg by mouth daily.    . polyethylene glycol (MIRALAX / GLYCOLAX) 17 g packet Take 17 g by mouth daily. MIX IN 4-8 OZ OF FLUID    . senna-docusate (SENOKOT-S) 8.6-50 MG tablet Take 2 tablets by mouth 2 (two) times daily.    . clopidogrel (PLAVIX) 75 MG tablet Take 75 mg by mouth daily. 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 Pt admitted from SNF - unclear level of function at SNF Functional Status:  Mobility: Bed Mobility Overal bed mobility: Needs Assistance Bed Mobility: Supine to Sit,Sit to Supine Supine to sit: Mod assist,HOB elevated Sit to supine: Mod assist,HOB elevated General bed mobility comments: Asssit for trunk support exiting bed and BLE return to bed Transfers Overall transfer level: Needs assistance Equipment used: 2 person hand held assist,Rolling walker (2 wheeled) Transfers: Sit to/from Stand Sit  to Stand: +2 physical assistance,+2 safety/equipment,Min assist,Mod assist General transfer comment: pt was able to stand with BUE HHA then stand to RW. +2 for safety however does not require +2 assistance. Ambulation/Gait General  Gait Details: unable to advance due to poor motor planning and pt fatigue noted with short standing tolerance.    ADL: ADL Overall ADL's : Needs assistance/impaired General ADL Comments: MIN A self-feeding seated EOB using dominant LUE - assist for balance.  Cognition: Cognition Overall Cognitive Status: Impaired/Different from baseline Orientation Level: Oriented X4 Cognition Arousal/Alertness: Awake/alert Behavior During Therapy: Flat affect Overall Cognitive Status: Impaired/Different from baseline Area of Impairment: Following commands,Problem solving Following Commands: Follows one step commands with increased time Problem Solving: Decreased initiation,Requires verbal cues,Requires tactile cues General Comments: Responds well to pbject cues and mimicing actions for THEREX. Responds verbally to direct questions Difficult to assess due to: Impaired communication  Blood pressure (!) 156/90, pulse 94, temperature 98.7 F (37.1 C), temperature source Oral, resp. rate 18, height 6' (1.829 m), weight 68.6 kg, SpO2 95 %. Physical Exam  General: Alert and oriented x 1, No apparent distress HEENT: Head is normocephalic, atraumatic, PERRLA, EOMI, sclera anicteric, oral mucosa pink and moist, dentition intact, ext ear canals clear,  Neck: Supple without JVD or lymphadenopathy Heart: Tachycardic. No murmurs rubs or gallops Chest: CTA bilaterally without wheezes, rales, or rhonchi; no distress Abdomen: Soft, non-tender, non-distended, bowel sounds positive. Extremities: No clubbing, cyanosis, or edema. Pulses are 2+ Skin: Clean and intact without signs of breakdown Neuro: Pt is disoriented with poor attention, delayed processing, dysarthric speech, comprehension and repetition is intact. 5/5 strength throughout.  Psych: Pt's affect is flat. Pt is cooperative  Results for orders placed or performed during the hospital encounter of 12/31/19 (from the past 24 hour(s))  Magnesium     Status:  None   Collection Time: 01/08/20  4:37 AM  Result Value Ref Range   Magnesium 1.8 1.7 - 2.4 mg/dL  Basic metabolic panel     Status: Abnormal   Collection Time: 01/08/20  4:37 AM  Result Value Ref Range   Sodium 141 135 - 145 mmol/L   Potassium 3.4 (L) 3.5 - 5.1 mmol/L   Chloride 111 98 - 111 mmol/L   CO2 22 22 - 32 mmol/L   Glucose, Bld 116 (H) 70 - 99 mg/dL   BUN 19 8 - 23 mg/dL   Creatinine, Ser 0.97 0.61 - 1.24 mg/dL   Calcium 9.0 8.9 - 35.3 mg/dL   GFR, Estimated >29 >92 mL/min   Anion gap 8 5 - 15   No results found.   Assessment/Plan: Diagnosis: L PICA occlusion 1. Does the need for close, 24 hr/day medical supervision in concert with the patient's rehab needs make it unreasonable for this patient to be served in a less intensive setting? Yes 2. Co-Morbidities requiring supervision/potential complications: HTN, CAD, HTN, prior stroke 3 weeks ago, gastroenteritis, AKI, tachycardia, tachypnea 3. Due to bladder management, bowel management, safety, skin/wound care, disease management, medication administration, pain management and patient education, does the patient require 24 hr/day rehab nursing? Yes 4. Does the patient require coordinated care of a physician, rehab nurse, therapy disciplines of PT, OT, SLP to address physical and functional deficits in the context of the above medical diagnosis(es)? Yes Addressing deficits in the following areas: balance, endurance, locomotion, strength, transferring, bowel/bladder control, bathing, dressing, feeding, grooming, toileting, cognition and psychosocial support 5. Can the patient actively participate in an intensive therapy program of at least  3 hrs of therapy per day at least 5 days per week? Yes 6. The potential for patient to make measurable gains while on inpatient rehab is excellent 7. Anticipated functional outcomes upon discharge from inpatient rehab are min assist  with PT, min assist with OT, min assist with  SLP. 8. Estimated rehab length of stay to reach the above functional goals is: 2-3 weeks 9. Anticipated discharge destination: Home 10. Overall Rehab/Functional Prognosis: good  RECOMMENDATIONS: This patient's condition is appropriate for continued rehabilitative care in the following setting: CIR Patient has agreed to participate in recommended program. N/A Note that insurance prior authorization may be required for reimbursement for recommended care.  Comment:  1) L PICA occlusion: Mr. Tetrault would benefit from intensive inpatient rehabilitation. 2) Cognitive deficits: He currently does not have capacity to make his own decisions and the decision to participate in CIR will need to involve his family. 3) Hypertension: gradual normotension to prevent hypoperfusion 4) Impaired mobility and ADLs: Admission coordinator to follow with patient's sister regarding admission to CIR upon bed availability.   Horton Chin, MD 01/08/2020

## 2020-01-08 NOTE — Progress Notes (Signed)
PROGRESS NOTE    Alexander Byrd  XNT:[id number] DOB: 1950-08-03 DOA: 12/31/2019 PCP: Jennette Bill., MD   Brief Narrative: Taken from H&P. Alexander Byrd is a 69 y.o. male with medical history significant of recent CVA about 3 weeks ago with residual left-sided weakness and slurred speech (he was admitted at [facility]), has been in an SNF for the past 3 weeks comes to the hospital with complaints of abdominal pain, nausea and vomiting.  Initially met severe sepsis criteria with tachypnea, tachycardia, lactic acidosis with lactic acid peaked at 6, and encephalopathy received IV fluid and broad-spectrum antibiotics.  GI pathogen with pathogenic E. Coli, blood and urine cultures pending. Apparently blood cultures were drawn after starting antibiotics. CT head without any acute changes but MRI brain with some questionable increased diffusion abnormality involving pons, left middle cerebellar peduncle and left cerebellar hemisphere, concern for seizures/encephalitis.  There is 1 nursing concern when patient with persistent right gaze and rapid nystagmus concerning for seizure and he was started on Keppra.  Neurology was consulted. CT abdomen with concern of enteritis. Speech evaluated him and recommending dysphagia 2 diet. AM of 12/8 abnormal MRI showing subacute cerebellar stroke, transferred to ICU for close monitoring, neuro advising no interventions  Subjective: Patient was resting comfortably when seen this morning. More awake and alert, answers simple questions, can sit up in bed, agrees sister is hcpoa.   Assessment & Plan:   Active Problems:   Sepsis ([place])  CVA Ischemic left cerebellar stroke 11/21 hospitalized at [facility], since then dysphagia and left sided weakness. Repeat MRI performed 12/9 with L cerebellar PICA stroke. Seen by neurology, out of window for fibrinolytics, discussed w/ stroke team at [facility] who do not think needs acute intervention at this time. Stable neuro exam.  Transferred to ICU for close neuro monitoring 12/9-12/11, to floor on 12/11. TTE w/ grade 1 dd, o/w unremarkable. Slowly improving - neuro following peripherally now - Continue with aspirin and Lipitor - cont tele - plavix loaded, will need to continue 75 daily for 3 months - pt/ot consulted, advising inpatient rehab, who have evaluated and deemed appropriate, we are now waiting for an inpatient bed to open (none today). May need to consider snf if no beds available for inpt rehab - SLP evaluated, ok for dysphagia 1 diet for now - neuro advising outpt f/u with dedicated stroke specialist such as dr. Leonie Man @ [facility]  Severe sepsis with acute enteritis.  POA. GI pathogen with epec. c diff neg. Lactic acid has normalized. Ct showing enteritis. Urine culture ngtd. Blood cultures also ngtd. Is s/p cefepime/flagyl. Hemodynamically stable. RESOLVED  - s/p 3 days azithromycin - Continue with gentle hydration as above  Encephalopathy. POA Likely 2/2 series of CVAs, acute infection likely contributory. Improving. - monitor  AKI.  Resolved. Creatinine recent [facility] hospitalization trended around 1.1. PO remains poor - Continue to monitor renal function.  Hypokalemia Hypomagnesemia k 3.4, mg 1.8 - cont kcl to 40 bid, give mg 1 g once - monitor  Partial urinary retention.  CT abdomen with prostatomegaly and bladder scan with more than 400 cc requiring one time in and out catheter.  There is some concern of urinary retention.  That can also contribute to AKI.  Apparently multiple soiled diapers, so most likely partial retention or incomplete emptying of bladder. Does not cooperate for post-void residual scan. - started on flomax - monitor  Essential hypertension.  Blood pressure mildly elevated today - re-start low dose amlodipine 5  today  Hyperlipidemia. -Continue with statin  Objective: Vitals:   01/07/20 2022 01/08/20 0355 01/08/20 0741 01/08/20 1119  BP: (!) 141/89 136/84 (!)  145/84 (!) 143/86  Pulse: 100 89 87 92  Resp: _0 Temp: 98.7 F (37.1 C) 98.6 F (37 C) 98.4 F (36.9 C) 98.6 F (37 C)  TempSrc: Oral Oral Oral Oral  SpO2: 98% 96% 96% 97%  Weight:  68.6 kg    Height:        Intake/Output Summary (Last 24 hours) at 01/08/2020 1413 Last data filed at 01/08/2020 1100 Gross per 24 hour  Intake 360 ml  Output 500 ml  Net -140 ml   Filed Weights   01/06/20 0427 01/07/20 0610 01/08/20 0355  Weight: 70 kg 69.9 kg 68.6 kg    Examination:  General exam: Appears calm and comfortable  Respiratory system: Clear to auscultation. Respiratory effort normal. Cardiovascular system: S1 & S2 heard, RRR. Gastrointestinal system: Soft, nontender, nondistended, bowel sounds positive. Central nervous system: moves all 4 extremities. No sig facial droop Extremities: No edema, no cyanosis, pulses intact and symmetrical. Psychiatry: no agigation   DVT prophylaxis: Lovenox Code Status: Full Family Communication: sister updated @ bedside 12/11 Disposition Plan:  Status is: Inpatient  Remains inpatient appropriate because:Unsafe d/c plan and Inpatient level of care appropriate due to severity of illness   Dispo: The patient is from: snf              Anticipated d/c is to: inpt rehab              Anticipated d/c date is: 2-5 days              Patient currently is medically stable to d/c.  Consultants:   Neurology  Procedures:  Antimicrobials:  S/p cefepime/flagyl Azithromycin 12/7>12/9  Data Reviewed: I have personally reviewed following labs and imaging studies  CBC: Recent Labs  Lab 01/02/20 0506  WBC 4.9  HGB 12.6*  HCT 36.3*  MCV 89.0  PLT 675   Basic Metabolic Panel: Recent Labs  Lab 01/04/20 0646 01/05/20 0555 01/06/20 0456 01/07/20 0548 01/08/20 0437  NA 137 135 136 139 141  K 3.4* 3.4* 3.2* 3.3* 3.4*  CL 106 106 106 107 111  CO2 17* 17* _1 GLUCOSE 72 74 111* 119* 116*  BUN _2 CREATININE  0.78 0.90 0.98 1.15 1.06  CALCIUM 8.8* 8.9 8.9 9.1 9.0  MG  --   --  1.6* 1.9 1.8   GFR: Estimated Creatinine Clearance: 62.9 mL/min (by C-G formula based on SCr of 1.06 mg/dL). Liver Function Tests: No results for input(s): AST, ALT, ALKPHOS, BILITOT, PROT, ALBUMIN in the last 168 hours. No results for input(s): LIPASE, AMYLASE in the last 168 hours. No results for input(s): AMMONIA in the last 168 hours. Coagulation Profile: No results for input(s): INR, PROTIME in the last 168 hours. Cardiac Enzymes: No results for input(s): CKTOTAL, CKMB, CKMBINDEX, TROPONINI in the last 168 hours. BNP (last 3 results) No results for input(s): PROBNP in the last 8760 hours. HbA1C: No results for input(s): HGBA1C in the last 72 hours. CBG: Recent Labs  Lab 01/03/20 1127 01/03/20 2006  GLUCAP 87 81   Lipid Profile: No results for input(s): CHOL, HDL, LDLCALC, TRIG, CHOLHDL, LDLDIRECT in the last 72 hours. Thyroid Function Tests: No results for input(s): TSH, T4TOTAL, FREET4, T3FREE, THYROIDAB in the last 72 hours. Anemia Panel: No results for input(s):  VITAMINB12, FOLATE, FERRITIN, TIBC, IRON, RETICCTPCT in the last 72 hours. Sepsis Labs: Recent Labs  Lab 01/02/20 0506  LATICACIDVEN 1.0    Recent Results (from the past 240 hour(s))  Resp Panel by RT-PCR (Flu A&B, Covid) Nasopharyngeal Swab     Status: None   Collection Time: 12/31/19  8:48 PM   Specimen: Nasopharyngeal Swab; Nasopharyngeal(NP) swabs in vial transport medium  Result Value Ref Range Status   SARS Coronavirus 2 by RT PCR NEGATIVE NEGATIVE Final    Comment: (NOTE) SARS-CoV-2 target nucleic acids are NOT DETECTED.  The SARS-CoV-2 RNA is generally detectable in upper respiratory specimens during the acute phase of infection. The lowest concentration of SARS-CoV-2 viral copies this assay can detect is 138 copies/mL. A negative result does not preclude SARS-Cov-2 infection and should not be used as the sole basis for  treatment or other patient management decisions. A negative result may occur with  improper specimen collection/handling, submission of specimen other than nasopharyngeal swab, presence of viral mutation(s) within the areas targeted by this assay, and inadequate number of viral copies(<138 copies/mL). A negative result must be combined with clinical observations, patient history, and epidemiological information. The expected result is Negative.  Fact Sheet for Patients:  EntrepreneurPulse.com.au  Fact Sheet for Healthcare Providers:  IncredibleEmployment.be  This test is no t yet approved or cleared by the [place] FDA and  has been authorized for detection and/or diagnosis of SARS-CoV-2 by FDA under an Emergency Use Authorization (EUA). This EUA will remain  in effect (meaning this test can be used) for the duration of the COVID-19 declaration under Section 564(b)(1) of the Act, 21 U.S.C.section 360bbb-3(b)(1), unless the authorization is terminated  or revoked sooner.       Influenza A by PCR NEGATIVE NEGATIVE Final   Influenza B by PCR NEGATIVE NEGATIVE Final    Comment: (NOTE) The Xpert Xpress SARS-CoV-2/FLU/RSV plus assay is intended as an aid in the diagnosis of influenza from Nasopharyngeal swab specimens and should not be used as a sole basis for treatment. Nasal washings and aspirates are unacceptable for Xpert Xpress SARS-CoV-2/FLU/RSV testing.  Fact Sheet for Patients: EntrepreneurPulse.com.au  Fact Sheet for Healthcare Providers: IncredibleEmployment.be  This test is not yet approved or cleared by the [place] FDA and has been authorized for detection and/or diagnosis of SARS-CoV-2 by FDA under an Emergency Use Authorization (EUA). This EUA will remain in effect (meaning this test can be used) for the duration of the COVID-19 declaration under Section 564(b)(1) of the Act, 21  U.S.C. section 360bbb-3(b)(1), unless the authorization is terminated or revoked.  Performed at [facility], [address], [place], [place] [address]   Urine Culture     Status: None   Collection Time: 12/31/19  9:15 PM   Specimen: Urine, Random  Result Value Ref Range Status   Specimen Description   Final    URINE, RANDOM Performed at [facility], [address] [place], [place] [address]    Special Requests   Final    NONE Performed at [facility], [address], [place], [place] [address]    Culture   Final    NO GROWTH Performed at [facility], [place] [address] [place], [place] [address]    Report Status 01/02/2020 FINAL  Final  C Difficile Quick Screen w PCR reflex     Status: None   Collection Time: 01/01/20  6:09 AM   Specimen: STOOL  Result Value Ref Range Status  C Diff antigen NEGATIVE NEGATIVE Final   C Diff toxin NEGATIVE NEGATIVE Final   C Diff interpretation No C. difficile detected.  Final    Comment: Performed at [facility], [address] [place], [place] [address]  Gastrointestinal Panel by PCR , Stool     Status: Abnormal   Collection Time: 01/01/20  6:09 AM   Specimen: STOOL  Result Value Ref Range Status   Campylobacter species NOT DETECTED NOT DETECTED Final   Plesimonas shigelloides NOT DETECTED NOT DETECTED Final   Salmonella species NOT DETECTED NOT DETECTED Final   Yersinia enterocolitica NOT DETECTED NOT DETECTED Final   Vibrio species NOT DETECTED NOT DETECTED Final   Vibrio cholerae NOT DETECTED NOT DETECTED Final   Enteroaggregative E coli (EAEC) NOT DETECTED NOT DETECTED Final   Enteropathogenic E coli (EPEC) DETECTED (A) NOT DETECTED Final    Comment: RESULT CALLED TO, READ BACK BY AND VERIFIED WITH: ISABELLA LAPIETRA ON 01/01/20 AT 0751 QSD    Enterotoxigenic E coli (ETEC) NOT DETECTED NOT DETECTED Final   Shiga like toxin producing E coli (STEC) NOT DETECTED NOT DETECTED Final    Shigella/Enteroinvasive E coli (EIEC) NOT DETECTED NOT DETECTED Final   Cryptosporidium NOT DETECTED NOT DETECTED Final   Cyclospora cayetanensis NOT DETECTED NOT DETECTED Final   Entamoeba histolytica NOT DETECTED NOT DETECTED Final   Giardia lamblia NOT DETECTED NOT DETECTED Final   Adenovirus F40/41 NOT DETECTED NOT DETECTED Final   Astrovirus NOT DETECTED NOT DETECTED Final   Norovirus GI/GII NOT DETECTED NOT DETECTED Final   Rotavirus A NOT DETECTED NOT DETECTED Final   Sapovirus (I, II, IV, and V) NOT DETECTED NOT DETECTED Final    Comment: Performed at [facility] [address] [place], [place] [address]  Blood culture (routine x 2)     Status: None   Collection Time: 01/01/20 11:47 AM   Specimen: BLOOD  Result Value Ref Range Status   Specimen Description BLOOD LEFT ARM  Final   Special Requests   Final    BOTTLES DRAWN AEROBIC AND ANAEROBIC Blood Culture results may not be optimal due to an excessive volume of blood received in culture bottles   Culture   Final    NO GROWTH 5 DAYS Performed at [facility], [address] [place], [place] [address]    Report Status 01/06/2020 FINAL  Final  Blood culture (routine x 2)     Status: None   Collection Time: 01/01/20 12:36 PM   Specimen: BLOOD  Result Value Ref Range Status   Specimen Description BLOOD BLOOD RIGHT HAND  Final   Special Requests   Final    BOTTLES DRAWN AEROBIC ONLY Blood Culture results may not be optimal due to an inadequate volume of blood received in culture bottles   Culture   Final    NO GROWTH 5 DAYS Performed at [facility], [address] [place], [place] [address]    Report Status 01/06/2020 FINAL  Final  MRSA PCR Screening     Status: None   Collection Time: 01/03/20  1:33 PM   Specimen: Nasal Mucosa; Nasopharyngeal  Result Value Ref Range Status   MRSA by PCR NEGATIVE NEGATIVE Final    Comment:        The GeneXpert MRSA Assay (FDA approved for NASAL  specimens only), is one component of a comprehensive MRSA colonization surveillance program. It is not intended to diagnose MRSA infection nor to guide or monitor treatment for  MRSA infections. Performed at [facility], [address], [place], [place] [address]      Radiology Studies: No results found.  Scheduled Meds: . aspirin  81 mg Oral Daily  . atorvastatin  80 mg Oral q1800  . Chlorhexidine Gluconate Cloth  6 each Topical Daily  . clopidogrel  75 mg Oral Daily  . cromolyn  2 drop Left Eye QID  . enoxaparin (LOVENOX) injection  40 mg Subcutaneous Q24H  . fluticasone  2 spray Each Nare Daily  . influenza vaccine adjuvanted  0.5 mL Intramuscular Tomorrow-1000  . loratadine  10 mg Oral Daily  . potassium chloride  40 mEq Oral BID AC  . tamsulosin  0.4 mg Oral QPC supper   Continuous Infusions:    LOS: 8 days   Time spent: 30 minutes.  Desma Maxim, MD [facility]  If 7PM-7AM, please contact night-coverage [email or web address]  01/08/2020, 2:13 PM

## 2020-01-08 NOTE — Progress Notes (Signed)
Inpatient Rehabilitation Admissions Coordinator   I spoke with pt's sister, [name], by phone to update her that bed not available at [facility] today and that I would follow up with her tomorrow with clarification of bed availability.  Ottie Glazier, RN, MSN Rehab Admissions Coordinator [phone] 01/08/2020 4:29 PM

## 2020-01-08 NOTE — PMR Pre-admission (Addendum)
PMR Admission Coordinator Pre-Admission Assessment  Patient: Alexander Byrd is an 69 y.o., male MRN: [id number] DOB: 11-Feb-1950 Height: 6' (182.9 cm) Weight: 70.1 kg              Insurance Information HMO:     PPO:      PCP:      IPA:      80/20:      OTHER:  PRIMARY: Medicare a and b      Policy#: [id number]      Subscriber: pt Benefits:  Phone #: passport one source     Name: 12/14 Eff. Date: 10/26/2014     Deduct: $1483      Out of Pocket Max: none      Life Max: none  CIR: 100%      SNF: was at SNF for 3 weeks pta Outpatient: 80%     Co-Pay: 20% Home Health: 100%      Co-Pay: none DME: 80%     Co-Pay: 20% Providers: pt choice  SECONDARY: none      Policy#:       Phone#:   Artist:       Phone#:   The Data processing manager" for patients in Inpatient Rehabilitation Facilities with attached "Privacy Act Statement-Health Care Records" was provided and verbally reviewed with: Family  Emergency Contact Information Contact Information    Name Relation Home Work Mobile   [place] Sister   [phone]   Cheree Ditto Significant other   [phone]   Sharmon Revere   [phone]     Current Medical History  Patient Admitting Diagnosis: CVA, sepsis  History of Present Illness:  69 year old right-handed male with history of hyperlipidemia, hypertension and 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  He was initially admitted to [facility] after his most recent CVA and was discharged to skilled nursing facility where he developed abdominal pain nausea and vomiting.  He presented to [facility] 12/31/2019.  EEG negative for seizure.  CT/MRI showed subtly increased diffusion abnormality involving the pons left middle cerebellar peduncle and adjacent left cerebellar hemisphere.  No other acute abnormality.  There was a few scattered remote lacunar infarcts about the left basal  ganglia and corona radiata.  CT of abdomen pelvis showed findings suggestive of enteritis.  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  Short segment moderate stenosis involving the mid right M1 segment.  Echocardiogram with ejection fraction of 60 to 65% no wall motion abnormalities grade 1 diastolic dysfunction.  Follow-up MRI completed findings consistent with left PICA infarction advised continue aspirin and Plavix.  Admission chemistries unremarkable aside glucose 169 creatinine 1.48, lipase 33, urinalysis negative nitrite with urine culture no growth, lactic acid 4.4, C. difficile negative, blood cultures no growth to date.  Hospital course work-up for sepsis related to acute enteritis he was placed on azithromycin x3 days as well as IV gentle hydration.  Latest creatinine improved 1.07 after gentle hydration.  He is currently on a dysphagia #1 honey thick liquid diet.  Subcutaneous Lovenox for DVT prophylaxis.    Complete NIHSS TOTAL: 12 Glasgow Coma Scale Score: (!) 20  Past Medical History  History reviewed. No pertinent past medical history.  Family History  family history is not on  file.  Prior Rehab/Hospitalizations:  Has the patient had prior rehab or hospitalizations prior to admission? Yes SNF [facility] after d/c from [facility] for 3 weeks  Has the patient had major surgery during 100 days prior to admission? No  Current Medications   Current Facility-Administered Medications:  .  acetaminophen (TYLENOL) tablet 650 mg, 650 mg, Oral, Q6H PRN **OR** acetaminophen (TYLENOL) suppository 650 mg, 650 mg, Rectal, Q6H PRN, Elvera Lennox, Costin M, MD .  amLODipine (NORVASC) tablet 5 mg, 5 mg, Oral, Daily, Wouk, Wilfred Curtis, MD, 5 mg at 01/09/20 [phone] .  aspirin chewable tablet 81 mg, 81 mg, Oral,  Daily, Leatha Gilding, MD, 81 mg at 01/09/20 0842 .  atorvastatin (LIPITOR) tablet 80 mg, 80 mg, Oral, q1800, Leatha Gilding, MD, 80 mg at 01/08/20 1707 .  Chlorhexidine Gluconate Cloth 2 % PADS 6 each, 6 each, Topical, Daily, Erin Fulling, MD, 6 each at 01/08/20 1024 .  clopidogrel (PLAVIX) tablet 75 mg, 75 mg, Oral, Daily, Erick Blinks, MD, 75 mg at 01/09/20 0842 .  cromolyn (OPTICROM) 4 % ophthalmic solution 2 drop, 2 drop, Left Eye, QID, Leatha Gilding, MD, 2 drop at 01/09/20 [phone] .  enoxaparin (LOVENOX) injection 40 mg, 40 mg, Subcutaneous, Q24H, Leatha Gilding, MD, 40 mg at 01/09/20 0842 .  fluticasone (FLONASE) 50 MCG/ACT nasal spray 2 spray, 2 spray, Each Nare, Daily, Leatha Gilding, MD, 2 spray at 01/09/20 [phone] .  influenza vaccine adjuvanted (FLUAD) injection 0.5 mL, 0.5 mL, Intramuscular, Tomorrow-1000, Isley, Dorris Fetch, RN .  ipratropium-albuterol (DUONEB) 0.5-2.5 (3) MG/3ML nebulizer solution 3 mL, 3 mL, Nebulization, Q6H PRN, Elvera Lennox, Costin M, MD .  loratadine (CLARITIN) tablet 10 mg, 10 mg, Oral, Daily, Leatha Gilding, MD, 10 mg at 01/09/20 0842 .  LORazepam (ATIVAN) injection 1 mg, 1 mg, Intravenous, Q15 min PRN, Erick Blinks, MD, 1 mg at 01/02/20 2244 .  ondansetron (ZOFRAN) tablet 4 mg, 4 mg, Oral, Q6H PRN **OR** ondansetron (ZOFRAN) injection 4 mg, 4 mg, Intravenous, Q6H PRN, Gherghe, Costin M, MD .  potassium chloride (KLOR-CON) packet 40 mEq, 40 mEq, Oral, BID AC, Wouk, Wilfred Curtis, MD, 40 mEq at 01/09/20 0842 .  tamsulosin (FLOMAX) capsule 0.4 mg, 0.4 mg, Oral, QPC supper, Arnetha Courser, MD, 0.4 mg at 01/08/20 1707  Patients Current Diet:  Diet Order            Diet - low sodium heart healthy           DIET - DYS 1 Room service appropriate? 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 Pt admitted from SNF - unclear level of function at SNF  Self Care: Did the patient need help bathing, dressing, using the toilet or eating?  Independent  Indoor Mobility: Did the patient need assistance with walking from room to room (with or without device)? Independent  Stairs: Did the patient need  assistance with internal or external stairs (with or without device)? Independent  Functional Cognition: Did the patient need help planning regular tasks such as shopping or remembering to take medications? Independent  Home Assistive Devices / Equipment Home Assistive Devices/Equipment: None  Prior Device Use: Indicate devices/aids used by the patient prior to current illness, exacerbation or injury? None of the above  Current Functional Level Cognition  Overall Cognitive Status: Impaired/Different from baseline Difficult to assess due to: Impaired communication Orientation Level: Oriented X4 Following Commands: Follows one step commands with increased time General Comments: Responds well to pbject cues and mimicing actions for THEREX. Responds verbally to direct questions    Extremity Assessment (includes Sensation/Coordination)  Upper Extremity Assessment: RUE deficits/detail,LUE deficits/detail RUE Deficits / Details: 2/5 grossly. Grip 3/5 LUE Deficits / Details: 2/5 grossly. Grip 3/5  Lower Extremity Assessment:  (3-/5 LLE LAQ, 2/5 RLE LAQ, difficult to assess BLE sensation & proprioception)    ADLs  Overall ADL's : Needs assistance/impaired General ADL Comments: MIN A  self-feeding seated EOB using dominant LUE - assist for balance.    Mobility  Overal bed mobility: Needs Assistance Bed Mobility: Supine to Sit,Sit to Supine Supine to sit: Mod assist,HOB elevated Sit to supine: Mod assist,HOB elevated General bed mobility comments: Asssit for trunk support exiting bed and BLE return to bed    Transfers  Overall transfer level: Needs assistance Equipment used: 2 person hand held assist,Rolling walker (2 wheeled) Transfers: Sit to/from Stand Sit to Stand: +2 physical assistance,+2 safety/equipment,Min assist,Mod assist General transfer comment: pt was able to stand with BUE HHA then stand to RW. +2 for safety however does not require +2 assistance.    Ambulation / Gait / Stairs / Wheelchair Mobility  Ambulation/Gait General Gait Details: unable to advance due to poor motor planning and pt fatigue noted with short standing tolerance.    Posture / Balance Dynamic Sitting Balance Sitting balance - Comments: intermittent CGA dynamic sitting Balance Overall balance assessment: Needs assistance Sitting-balance support: Single extremity supported,Feet supported Sitting balance-Leahy Scale: Fair Sitting balance - Comments: intermittent CGA dynamic sitting Postural control: Left lateral lean Standing balance support: Bilateral upper extremity supported,During functional activity Standing balance-Leahy Scale: Poor Standing balance comment: needs constant assistance to maintain standing balance    Special needs/care consideration    Previous Home Environment  Living Arrangements: Spouse/significant other  Lives With: Significant other Available Help at Discharge: Family,Available 24 hours/day (sister, [name], to arrange 24/7 assist at her home or his home) Type of Home: Mobile home Care Facility Name: [facility] Home Layout: One level Home Access: Ramped entrance Bathroom Shower/Tub: Engineer, manufacturing systems: Standard Bathroom  Accessibility: Yes How Accessible: Accessible via walker Home Care Services: No Additional Comments: admitted from [facility] where [facility]  Discharge Living Setting Plans for Discharge Living Setting: Patient's home,Mobile Home Type of Home at Discharge: Mobile home Discharge Home Layout: One level Discharge Home Access: Ramped entrance Discharge Bathroom Shower/Tub: Tub/shower unit Discharge Bathroom Toilet: Standard Discharge Bathroom Accessibility: Yes How Accessible: Accessible via walker Does the patient have any problems obtaining your medications?: No  Social/Family/Support Systems Contact Information: sister, [name] Anticipated Caregiver: sister and family memebrs Anticipated Caregiver's Contact Information: [phone] Ability/Limitations of Caregiver: [name] works, but will arrnage needed caregiver support Caregiver Availability: 24/7 Discharge Plan Discussed with Primary Caregiver: Yes Is Caregiver In Agreement with Plan?: Yes Does Caregiver/Family have Issues with Lodging/Transportation while Pt is in Rehab?: No  Goals Patient/Family Goal for Rehab: supervision  to min asist with PT, OT, and SLP Expected length of stay: ELOS 2-3 weeks Pt/Family Agrees to Admission and willing to participate: Yes Program Orientation Provided & Reviewed with Pt/Caregiver Including Roles  & Responsibilities: Yes  Decrease burden of Care through IP rehab admission: n/a  Possible need for SNF placement upon discharge:if does not reach a level to for d/c home, family likely would consider further rehab at SNF  Patient Condition: Patient's condition remains as documented in consult dated 01/08/2020 for which he is deemed appropriate for inpatient acute rehabilitation admission. I will arrange admit for today.  Preadmission Screen Completed By:  Clois DupesBoyette, Barbara Godwin, RN, 01/09/2020 10:41 AM ______________________________________________________________________    Discussed status with Dr. Allena KatzPatel on 01/09/2020 at  1041 and received approval for admission today.  Admission Coordinator:  Clois DupesBoyette, Barbara Godwin, time [id number] Date 01/09/2020

## 2020-01-08 NOTE — Progress Notes (Signed)
Physical Therapy Treatment Patient Details Name: Alexander Byrd MRN: [id number] DOB: 06/14/1950 Today's Date: 01/08/2020    History of Present Illness Alexander Byrd is a 69 y.o. 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 MRI with L Cerebellar PICA stroke, CTA with severely diseased vertebrobasilar system with scant flow throgh BL intracranial vertebral arteries and basilar artery with L PICA occlusion. He has BL fetal PCAs.    PT Comments    Pt was supine in bed with HOB slightly elevated upon arriving. He agrees to PT session and is cooperative and motivated throughout. Required +2 assistance yesterday however today demonstrated much improved abilities. +2 for safety with today's session however was able to perform most mobility/transfers with +1 assist only. She was able to exit L side of bed and stand with +2 for safety. Stood one time with BUE HHA. Stood 2nd time to [employer]. Focused on standing balance and finding neutral and balance in general throughout session. Pt is great CIR candidate and will benefit form continued skilled PT at DC to address deficits and promote return in function and independence.   Follow Up Recommendations  CIR;Supervision for mobility/OOB;Supervision/Assistance - 24 hour     Equipment Recommendations  Other (comment) (defer to next level of care)    Recommendations for Other Services       Precautions / Restrictions Precautions Precautions: Fall Precaution Comments: R>L hemi, R inattention Restrictions Weight Bearing Restrictions: No    Mobility  Bed Mobility Overal bed mobility: Needs Assistance Bed Mobility: Supine to Sit;Sit to Supine     Supine to sit: +2 for safety/equipment;HOB elevated;Mod assist (HOB slightly elevated~ 15 degrees) Sit to supine: Max  assist   General bed mobility comments: Pt required +2 assistance previous date however today was able to exit and re-enter bed with only +1 assist. 2nd person in room for safety.  Transfers Overall transfer level: Needs assistance Equipment used: 2 person hand held assist;Rolling walker (2 wheeled) Transfers: Sit to/from Stand Sit to Stand: +2 physical assistance;+2 safety/equipment;Min assist;Mod assist         General transfer comment: pt was able to stand with BUE HHA then stand to RW. +2 for safety however does not require +2 assistance.  Ambulation/Gait  General Gait Details: unable to advance due to poor motor planning and pt fatigue noted with short standing tolerance.      Balance Overall balance assessment: Needs assistance Sitting-balance support: Bilateral upper extremity supported;Feet supported Sitting balance-Leahy Scale: Poor     Standing balance support: Bilateral upper extremity supported;During functional activity Standing balance-Leahy Scale: Poor Standing balance comment: needs constant assistance to maintain standing balance         Cognition Arousal/Alertness: Awake/alert Behavior During Therapy: Flat affect Overall Cognitive Status: Impaired/Different from baseline Area of Impairment: Following commands;Problem solving        Following Commands: Follows one step commands with increased time       General Comments: Pt is alert throughout. Does require increased time to process and perform desired task however does consistently follow commands. Pt is very motivated             Pertinent Vitals/Pain Pain Assessment: No/denies pain Faces Pain Scale: No hurt           PT Goals (current goals can now be found in the care plan section) Acute  Rehab PT Goals Patient Stated Goal: To return to PLOF Progress towards PT goals: Progressing toward goals    Frequency    7X/week      PT Plan Current plan remains appropriate     Co-evaluation     PT goals addressed during session: Mobility/safety with mobility;Balance;Proper use of DME;Strengthening/ROM        AM-PAC PT "6 Clicks" Mobility   Outcome Measure  Help needed turning from your back to your side while in a flat bed without using bedrails?: Total Help needed moving from lying on your back to sitting on the side of a flat bed without using bedrails?: Total Help needed moving to and from a bed to a chair (including a wheelchair)?: Total Help needed standing up from a chair using your arms (e.g., wheelchair or bedside chair)?: Total Help needed to walk in hospital room?: Total Help needed climbing 3-5 steps with a railing? : Total 6 Click Score: 6    End of Session Equipment Utilized During Treatment: Gait belt Activity Tolerance: Patient tolerated treatment well Patient left: in bed;with call bell/phone within reach;with bed alarm set Nurse Communication: Mobility status PT Visit Diagnosis: Muscle weakness (generalized) (M62.81);Other abnormalities of gait and mobility (R26.89);Difficulty in walking, not elsewhere classified (R26.2);Hemiplegia and hemiparesis Hemiplegia - Right/Left: Right Hemiplegia - dominant/non-dominant: Non-dominant Hemiplegia - caused by: Cerebral infarction     Time: 1040-1057 PT Time Calculation (min) (ACUTE ONLY): 17 min  Charges:  $Neuromuscular Re-education: 8-22 mins                     Jetta Lout PTA 01/08/20, 1:09 PM

## 2020-01-08 NOTE — Progress Notes (Signed)
Cir bed at [facility] is not available for this patient today. I discussed with Dr. Ashok Pall. I will update patient's sister today and follow up with acute team tomorrow to clarify if bed will be available for this patient to admit.  Ottie Glazier, RN, MSN Rehab Admissions Coordinator [phone] 01/08/2020 11:53 AM

## 2020-01-08 NOTE — Progress Notes (Signed)
Occupational Therapy Treatment Patient Details Name: Alexander Byrd MRN: [id number] DOB: 1950-12-10 Today's Date: 01/08/2020    History of present illness Alexander Byrd is a 69 y.o. 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 MRI with L Cerebellar PICA stroke, CTA with severely diseased vertebrobasilar system with scant flow throgh BL intracranial vertebral arteries and basilar artery with L PICA occlusion. He has BL fetal PCAs.   OT comments  [name] was seen for OT treatment on this date. Upon arrival to room pt reclined in bed c meal at bed side. Pt agreeable to session, demonstrating significantly improved mobility and sitting balance from prior date. MOD A exit L side of bed, assist for trunk elevation only. Pt continues to require L forearm support on pillows for sitting balance 2/2 L lateral lean. Tolerates ~10 min sitting, initial 5 min requiring MIN A dynamic sitting balance decreasing to MOD A as pt fatigued. MIN A self-feeding c dominant LUE seated EOB. Pt tolerated ~10 min BUE AAROM THEREX: 1 set x 20 reps each - chest press, overhead press, fist pumps, passing ball L+R at midline. Pt making great progress toward goals. Pt continues to benefit from skilled OT services to maximize return to PLOF and minimize risk of future falls, injury, caregiver burden, and readmission. Will continue to follow POC. Pt remains an excellent candidate for CIR.   Follow Up Recommendations  CIR    Equipment Recommendations  Other (comment) (To be determined at next venue of care)    Recommendations for Other Services      Precautions / Restrictions Precautions Precautions: Fall Precaution Comments: R>L hemi, R inattention Restrictions Weight Bearing Restrictions: No       Mobility Bed  Mobility Overal bed mobility: Needs Assistance Bed Mobility: Supine to Sit;Sit to Supine     Supine to sit: Mod assist;HOB elevated Sit to supine: Mod assist;HOB elevated   General bed mobility comments: Asssit for trunk support exiting bed and BLE return to bed  Transfers Overall transfer level: Needs assistance                    Balance Overall balance assessment: Needs assistance Sitting-balance support: Single extremity supported;Feet supported Sitting balance-Leahy Scale: Fair Sitting balance - Comments: intermittent CGA dynamic sitting Postural control: Left lateral lean                                 ADL either performed or assessed with clinical judgement   ADL Overall ADL's : Needs assistance/impaired                                       General ADL Comments: MIN A self-feeding seated EOB using dominant LUE - assist for balance.               Cognition Arousal/Alertness: Awake/alert Behavior During Therapy: Flat affect Overall Cognitive Status: Impaired/Different from baseline Area of Impairment: Following commands;Problem solving                       Following Commands: Follows one step commands with increased time     Problem Solving: Decreased initiation;Requires verbal cues;Requires tactile cues  General Comments: Responds well to pbject cues and mimicing actions for THEREX. Responds verbally to direct questions        Exercises Exercises: Other exercises Other Exercises Other Exercises: Pt educated re: OT role, DME recs, d/c recs, falls prevention, ECS, HEP Other Exercises: BUE AAROM THEREX: 1 set x 20 reps each - chest press, overhead press, fist pumps, passing ball L+R at midline Other Exercises: Self-feeding, face washing, sup<>sit, sitting/standing balance/toelrance           Pertinent Vitals/ Pain       Pain Assessment: No/denies pain         Frequency  Min 2X/week         Progress Toward Goals  OT Goals(current goals can now be found in the care plan section)  Progress towards OT goals: Progressing toward goals  Acute Rehab OT Goals Patient Stated Goal: To return to PLOF OT Goal Formulation: With family Time For Goal Achievement: 01/20/20 Potential to Achieve Goals: Good ADL Goals Pt Will Perform Grooming: with min assist;sitting Pt Will Perform Upper Body Dressing: with mod assist;sitting Pt Will Transfer to Toilet: with mod assist;with +2 assist;stand pivot transfer;bedside commode  Plan Discharge plan remains appropriate;Frequency remains appropriate       AM-PAC OT "6 Clicks" Daily Activity     Outcome Measure   Help from another person eating meals?: A Little Help from another person taking care of personal grooming?: A Little Help from another person toileting, which includes using toliet, bedpan, or urinal?: A Lot Help from another person bathing (including washing, rinsing, drying)?: A Lot Help from another person to put on and taking off regular upper body clothing?: A Lot Help from another person to put on and taking off regular lower body clothing?: A Lot 6 Click Score: 14    End of Session    OT Visit Diagnosis: Other abnormalities of gait and mobility (R26.89);Hemiplegia and hemiparesis Hemiplegia - Right/Left: Right Hemiplegia - dominant/non-dominant: Non-Dominant Hemiplegia - caused by: Cerebral infarction   Activity Tolerance Patient tolerated treatment well   Patient Left in bed;with call bell/phone within reach;with bed alarm set   Nurse Communication          Time: 8338-2505 OT Time Calculation (min): 24 min  Charges: OT General Charges $OT Visit: 1 Visit OT Treatments $Self Care/Home Management : 8-22 mins $Therapeutic Exercise: 8-22 mins  Kathie Dike, M.S. OTR/L  01/08/20, 4:04 PM  ascom [phone]

## 2020-01-09 ENCOUNTER — Inpatient Hospital Stay (HOSPITAL_COMMUNITY)
Admission: RE | Admit: 2020-01-09 | Discharge: 2020-02-02 | DRG: 057 | Disposition: A | Discharge status: Home Health | Payer: Medicare Other | Source: Other Acute Inpatient Hospital | Attending: Physical Medicine & Rehabilitation | Admitting: Physical Medicine & Rehabilitation

## 2020-01-09 ENCOUNTER — Encounter: Payer: Self-pay | Admitting: Internal Medicine

## 2020-01-09 DIAGNOSIS — G8929 Other chronic pain: Secondary | ICD-10-CM | POA: Diagnosis present

## 2020-01-09 DIAGNOSIS — H532 Diplopia: Secondary | ICD-10-CM | POA: Diagnosis present

## 2020-01-09 DIAGNOSIS — R000 Tachycardia, unspecified: Secondary | ICD-10-CM | POA: Diagnosis not present

## 2020-01-09 DIAGNOSIS — Z8673 Personal history of transient ischemic attack (TIA), and cerebral infarction without residual deficits: Secondary | ICD-10-CM

## 2020-01-09 DIAGNOSIS — G463 Brain stem stroke syndrome: Secondary | ICD-10-CM | POA: Diagnosis present

## 2020-01-09 DIAGNOSIS — Z23 Encounter for immunization: Secondary | ICD-10-CM

## 2020-01-09 DIAGNOSIS — I639 Cerebral infarction, unspecified: Secondary | ICD-10-CM | POA: Diagnosis not present

## 2020-01-09 DIAGNOSIS — I69318 Other symptoms and signs involving cognitive functions following cerebral infarction: Secondary | ICD-10-CM | POA: Diagnosis not present

## 2020-01-09 DIAGNOSIS — D62 Acute posthemorrhagic anemia: Secondary | ICD-10-CM | POA: Diagnosis present

## 2020-01-09 DIAGNOSIS — I69391 Dysphagia following cerebral infarction: Secondary | ICD-10-CM | POA: Diagnosis not present

## 2020-01-09 DIAGNOSIS — I69322 Dysarthria following cerebral infarction: Secondary | ICD-10-CM | POA: Diagnosis not present

## 2020-01-09 DIAGNOSIS — R6520 Severe sepsis without septic shock: Secondary | ICD-10-CM | POA: Diagnosis not present

## 2020-01-09 DIAGNOSIS — I69320 Aphasia following cerebral infarction: Secondary | ICD-10-CM | POA: Diagnosis not present

## 2020-01-09 DIAGNOSIS — I63531 Cerebral infarction due to unspecified occlusion or stenosis of right posterior cerebral artery: Secondary | ICD-10-CM

## 2020-01-09 DIAGNOSIS — R110 Nausea: Secondary | ICD-10-CM | POA: Diagnosis not present

## 2020-01-09 DIAGNOSIS — I69398 Other sequelae of cerebral infarction: Secondary | ICD-10-CM

## 2020-01-09 DIAGNOSIS — Z7902 Long term (current) use of antithrombotics/antiplatelets: Secondary | ICD-10-CM

## 2020-01-09 DIAGNOSIS — K5901 Slow transit constipation: Secondary | ICD-10-CM | POA: Diagnosis not present

## 2020-01-09 DIAGNOSIS — N179 Acute kidney failure, unspecified: Secondary | ICD-10-CM | POA: Diagnosis not present

## 2020-01-09 DIAGNOSIS — Z7982 Long term (current) use of aspirin: Secondary | ICD-10-CM

## 2020-01-09 DIAGNOSIS — R338 Other retention of urine: Secondary | ICD-10-CM | POA: Diagnosis present

## 2020-01-09 DIAGNOSIS — G510 Bell's palsy: Secondary | ICD-10-CM | POA: Diagnosis present

## 2020-01-09 DIAGNOSIS — N401 Enlarged prostate with lower urinary tract symptoms: Secondary | ICD-10-CM | POA: Diagnosis present

## 2020-01-09 DIAGNOSIS — I69354 Hemiplegia and hemiparesis following cerebral infarction affecting left non-dominant side: Secondary | ICD-10-CM | POA: Diagnosis present

## 2020-01-09 DIAGNOSIS — Z79899 Other long term (current) drug therapy: Secondary | ICD-10-CM

## 2020-01-09 DIAGNOSIS — F0150 Vascular dementia without behavioral disturbance: Secondary | ICD-10-CM | POA: Diagnosis present

## 2020-01-09 DIAGNOSIS — I10 Essential (primary) hypertension: Secondary | ICD-10-CM | POA: Diagnosis present

## 2020-01-09 DIAGNOSIS — H9190 Unspecified hearing loss, unspecified ear: Secondary | ICD-10-CM | POA: Diagnosis present

## 2020-01-09 DIAGNOSIS — I69328 Other speech and language deficits following cerebral infarction: Secondary | ICD-10-CM | POA: Diagnosis not present

## 2020-01-09 DIAGNOSIS — N400 Enlarged prostate without lower urinary tract symptoms: Secondary | ICD-10-CM | POA: Diagnosis not present

## 2020-01-09 DIAGNOSIS — R1312 Dysphagia, oropharyngeal phase: Secondary | ICD-10-CM | POA: Diagnosis present

## 2020-01-09 DIAGNOSIS — A4151 Sepsis due to Escherichia coli [E. coli]: Secondary | ICD-10-CM | POA: Diagnosis not present

## 2020-01-09 DIAGNOSIS — N39498 Other specified urinary incontinence: Secondary | ICD-10-CM | POA: Diagnosis not present

## 2020-01-09 DIAGNOSIS — E785 Hyperlipidemia, unspecified: Secondary | ICD-10-CM

## 2020-01-09 DIAGNOSIS — M79671 Pain in right foot: Secondary | ICD-10-CM | POA: Diagnosis not present

## 2020-01-09 DIAGNOSIS — K529 Noninfective gastroenteritis and colitis, unspecified: Secondary | ICD-10-CM | POA: Diagnosis not present

## 2020-01-09 LAB — BASIC METABOLIC PANEL
Anion gap: 7 (ref 5–15)
BUN: 14 mg/dL (ref 8–23)
CO2: 24 mmol/L (ref 22–32)
Calcium: 8.9 mg/dL (ref 8.9–10.3)
Chloride: 109 mmol/L (ref 98–111)
Creatinine, Ser: 1.07 mg/dL (ref 0.61–1.24)
GFR, Estimated: >60 mL/min (ref >60)
Glucose, Bld: 115 mg/dL — ABNORMAL HIGH (ref 70–99)
Potassium: 3.9 mmol/L (ref 3.5–5.1)
Sodium: 140 mmol/L (ref 135–145)

## 2020-01-09 LAB — MAGNESIUM: Magnesium: 1.9 mg/dL (ref 1.7–2.4)

## 2020-01-09 MED ORDER — CLOPIDOGREL BISULFATE 75 MG PO TABS: 75.0000 mg | ORAL_TABLET | Freq: Every day | ORAL | 2 refills | Status: DC

## 2020-01-09 MED ORDER — ACETAMINOPHEN 650 MG RE SUPP: 650.0000 mg | Freq: Four times a day (QID) | RECTAL | Status: DC | PRN

## 2020-01-09 MED ORDER — FLUTICASONE PROPIONATE 50 MCG/ACT NA SUSP
2.0000 | Freq: Every day | NASAL | Status: DC
  Administered 2020-01-10 – 2020-02-02 (×17): 2 via NASAL
  Filled 2020-01-09: qty 16

## 2020-01-09 MED ORDER — TAMSULOSIN HCL 0.4 MG PO CAPS: 0.4000 mg | ORAL_CAPSULE | Freq: Every day | ORAL | Status: DC

## 2020-01-09 MED ORDER — ATORVASTATIN CALCIUM 80 MG PO TABS
80.0000 mg | ORAL_TABLET | Freq: Every day | ORAL | Status: DC
  Administered 2020-01-09 – 2020-02-01 (×24): 80 mg via ORAL
  Filled 2020-01-09 (×24): qty 1

## 2020-01-09 MED ORDER — POTASSIUM CHLORIDE 20 MEQ PO PACK
40.0000 meq | PACK | Freq: Two times a day (BID) | ORAL | Status: DC
  Administered 2020-01-09 – 2020-01-28 (×38): 40 meq via ORAL
  Filled 2020-01-09 (×40): qty 2

## 2020-01-09 MED ORDER — ONDANSETRON HCL 4 MG PO TABS
4.0000 mg | ORAL_TABLET | Freq: Four times a day (QID) | ORAL | Status: DC | PRN
  Administered 2020-01-14: 20:00:00 4 mg via ORAL
  Filled 2020-01-09: qty 1

## 2020-01-09 MED ORDER — CLOPIDOGREL BISULFATE 75 MG PO TABS
75.0000 mg | ORAL_TABLET | Freq: Every day | ORAL | Status: DC
  Administered 2020-01-10 – 2020-02-02 (×24): 75 mg via ORAL
  Filled 2020-01-09 (×25): qty 1

## 2020-01-09 MED ORDER — CROMOLYN SODIUM 4 % OP SOLN
2.0000 [drp] | Freq: Four times a day (QID) | OPHTHALMIC | Status: DC
  Administered 2020-01-10 – 2020-01-17 (×29): 2 [drp] via OPHTHALMIC
  Filled 2020-01-09 (×2): qty 10

## 2020-01-09 MED ORDER — AMLODIPINE BESYLATE 5 MG PO TABS
5.0000 mg | ORAL_TABLET | Freq: Every day | ORAL | Status: DC
  Administered 2020-01-10 – 2020-02-02 (×24): 5 mg via ORAL
  Filled 2020-01-09 (×24): qty 1

## 2020-01-09 MED ORDER — IPRATROPIUM-ALBUTEROL 0.5-2.5 (3) MG/3ML IN SOLN: 3.0000 mL | Freq: Four times a day (QID) | RESPIRATORY_TRACT | Status: DC | PRN

## 2020-01-09 MED ORDER — ONDANSETRON HCL 4 MG/2ML IJ SOLN: 4.0000 mg | Freq: Four times a day (QID) | INTRAMUSCULAR | Status: DC | PRN

## 2020-01-09 MED ORDER — POTASSIUM CHLORIDE 20 MEQ PO PACK: 20.0000 meq | PACK | Freq: Two times a day (BID) | ORAL | Status: DC

## 2020-01-09 MED ORDER — ACETAMINOPHEN 325 MG PO TABS
650.0000 mg | ORAL_TABLET | Freq: Four times a day (QID) | ORAL | Status: DC | PRN
Start: 2020-01-09 — End: 2020-02-02
  Administered 2020-01-10 – 2020-01-15 (×2): 650 mg via ORAL
  Filled 2020-01-09 (×3): qty 2

## 2020-01-09 MED ORDER — LORATADINE 10 MG PO TABS
10.0000 mg | ORAL_TABLET | Freq: Every day | ORAL | Status: DC
  Administered 2020-01-10 – 2020-02-02 (×24): 10 mg via ORAL
  Filled 2020-01-09 (×24): qty 1

## 2020-01-09 MED ORDER — ASPIRIN 81 MG PO CHEW
81.0000 mg | CHEWABLE_TABLET | Freq: Every day | ORAL | Status: DC
  Administered 2020-01-10 – 2020-02-02 (×24): 81 mg via ORAL
  Filled 2020-01-09 (×24): qty 1

## 2020-01-09 MED ORDER — TAMSULOSIN HCL 0.4 MG PO CAPS
0.4000 mg | ORAL_CAPSULE | Freq: Every day | ORAL | Status: DC
  Administered 2020-01-09 – 2020-02-01 (×24): 0.4 mg via ORAL
  Filled 2020-01-09 (×24): qty 1

## 2020-01-09 MED ORDER — ENOXAPARIN SODIUM 40 MG/0.4ML ~~LOC~~ SOLN: 40.0000 mg | SUBCUTANEOUS | Status: DC

## 2020-01-09 MED ORDER — ACETAMINOPHEN 325 MG PO TABS: 650.0000 mg | ORAL_TABLET | Freq: Four times a day (QID) | ORAL | Status: AC | PRN

## 2020-01-09 MED ORDER — ENOXAPARIN SODIUM 40 MG/0.4ML ~~LOC~~ SOLN
40.0000 mg | SUBCUTANEOUS | Status: DC
  Administered 2020-01-10 – 2020-02-02 (×24): 40 mg via SUBCUTANEOUS
  Filled 2020-01-09 (×24): qty 0.4

## 2020-01-09 NOTE — Progress Notes (Signed)
Marcello Fennel, MD  Physician  Physical Medicine and Rehabilitation  PMR Pre-admission      Addendum  Date of Service:  01/08/2020  4:34 PM      Related encounter: ED to Hosp-Admission (Discharged) from 12/31/2019 in [facility] PCU           Show:Clear all [x] Manual[x] Template[x] Copied  Added by: [x] Beauden Tremont, , RN[x] , MD   [] Hover for details  PMR Admission Coordinator Pre-Admission Assessment   Patient: Alexander Byrd is an 69 y.o., male MRN: DOB: 09-Sep-1950 Height: 6' (182.9 cm) Weight: 70.1 kg                                                                                                                                                  Insurance Information HMO:     PPO:      PCP:      IPA:      80/20:      OTHER:  PRIMARY: Medicare a and b      Policy#: [id number]      Subscriber: pt Benefits:  Phone #: passport one source     Name: 12/14 Eff. Date: 10/26/2014     Deduct: $1483      Out of Pocket Max: none      Life Max: none  CIR: 100%      SNF: was at SNF for 3 weeks pta Outpatient: 80%     Co-Pay: 20% Home Health: 100%      Co-Pay: none DME: 80%     Co-Pay: 20% Providers: pt choice  SECONDARY: none      Policy#:       Phone#:    11/23/1950:       Phone#:    The 1/15" for patients in Inpatient Rehabilitation Facilities with attached "Privacy Act Statement-Health Care Records" was provided and verbally reviewed with: Family   Emergency Contact Information         Contact Information     Name Relation Home Work Mobile    [place] Sister     [phone]    Data processing manager Significant other     [phone]    [phone]     [phone]       Current Medical History  Patient Admitting Diagnosis: CVA, sepsis   History of Present Illness:  69 year old right-handed male with history of hyperlipidemia, hypertension and 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  He was initially admitted to [facility] after his most recent CVA and was discharged to skilled nursing facility where he developed abdominal pain nausea and vomiting.  He presented to [facility] 12/31/2019.  EEG negative for seizure.  CT/MRI showed subtly increased diffusion abnormality involving the pons left middle cerebellar peduncle  and adjacent left cerebellar hemisphere.  No other acute abnormality.  There was a few scattered remote lacunar infarcts about the left basal ganglia and corona radiata.  CT of abdomen pelvis showed findings suggestive of enteritis.  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  Short segment moderate stenosis involving the mid right M1 segment.  Echocardiogram with ejection fraction of 60 to 65% no wall motion abnormalities grade 1 diastolic dysfunction.  Follow-up MRI completed findings consistent with left PICA infarction advised continue aspirin and Plavix.  Admission chemistries unremarkable aside glucose 169 creatinine 1.48, lipase 33, urinalysis negative nitrite with urine culture no growth, lactic acid 4.4, C. difficile negative, blood cultures no growth to date.  Hospital course work-up for sepsis related to acute enteritis he was placed on azithromycin x3 days as well as IV gentle hydration.  Latest creatinine improved 1.07 after gentle hydration.  He is currently on a dysphagia #1 honey thick liquid diet.  Subcutaneous Lovenox for DVT prophylaxis.     Complete NIHSS TOTAL: 12 Glasgow Coma Scale Score: (!) 20   Past Medical History  History reviewed. No pertinent past medical history.   Family History  family history is not on file.   Prior Rehab/Hospitalizations:  Has the patient  had prior rehab or hospitalizations prior to admission? Yes SNF [facility] after d/c from [facility] for 3 weeks   Has the patient had major surgery during 100 days prior to admission? No   Current Medications    Current Facility-Administered Medications:  .  acetaminophen (TYLENOL) tablet 650 mg, 650 mg, Oral, Q6H PRN **OR** acetaminophen (TYLENOL) suppository 650 mg, 650 mg, Rectal, Q6H PRN, Elvera Lennox, Costin M, MD .  amLODipine (NORVASC) tablet 5 mg, 5 mg, Oral, Daily, Wouk, Wilfred Curtis, MD, 5 mg at 01/09/20 [phone] .  aspirin chewable tablet 81 mg, 81 mg, Oral, Daily, Leatha Gilding, MD, 81 mg at 01/09/20 0842 .  atorvastatin (LIPITOR) tablet 80 mg, 80 mg, Oral, q1800, Leatha Gilding, MD, 80 mg at 01/08/20 1707 .  Chlorhexidine Gluconate Cloth 2 % PADS 6 each, 6 each, Topical, Daily, Erin Fulling, MD, 6 each at 01/08/20 1024 .  clopidogrel (PLAVIX) tablet 75 mg, 75 mg, Oral, Daily, Erick Blinks, MD, 75 mg at 01/09/20 0842 .  cromolyn (OPTICROM) 4 % ophthalmic solution 2 drop, 2 drop, Left Eye, QID, Leatha Gilding, MD, 2 drop at 01/09/20 [phone] .  enoxaparin (LOVENOX) injection 40 mg, 40 mg, Subcutaneous, Q24H, Leatha Gilding, MD, 40 mg at 01/09/20 0842 .  fluticasone (FLONASE) 50 MCG/ACT nasal spray 2 spray, 2 spray, Each Nare, Daily, Leatha Gilding, MD, 2 spray at 01/09/20 [phone] .  influenza vaccine adjuvanted (FLUAD) injection 0.5 mL, 0.5 mL, Intramuscular, Tomorrow-1000, Isley, Dorris Fetch, RN .  ipratropium-albuterol (DUONEB) 0.5-2.5 (3) MG/3ML nebulizer solution 3 mL, 3 mL, Nebulization, Q6H PRN, Elvera Lennox, Costin M, MD .  loratadine (CLARITIN) tablet 10 mg, 10 mg, Oral, Daily, Leatha Gilding, MD, 10 mg at 01/09/20 0842 .  LORazepam (ATIVAN) injection 1 mg, 1 mg, Intravenous, Q15 min PRN, Erick Blinks, MD, 1 mg at 01/02/20 2244 .  ondansetron (ZOFRAN) tablet 4 mg, 4 mg, Oral, Q6H PRN **OR** ondansetron (ZOFRAN) injection 4 mg, 4 mg, Intravenous, Q6H PRN, Gherghe, Daylene Katayama,  MD .  potassium chloride (KLOR-CON) packet  40 mEq, 40 mEq, Oral, BID AC, Wouk, Wilfred CurtisNoah Bedford, MD, 40 mEq at 01/09/20 [phone] .  tamsulosin (FLOMAX) capsule 0.4 mg, 0.4 mg, Oral, QPC supper, Arnetha CourserAmin, Sumayya, MD, 0.4 mg at 01/08/20 1707   Patients Current Diet:     Diet Order                      Diet - low sodium heart healthy              DIET - DYS 1 Room service appropriate? 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 Pt admitted from SNF - unclear level of function at SNF   Self Care: Did the patient need help bathing, dressing, using the toilet or eating?  Independent   Indoor Mobility: Did the patient need assistance with walking from room to room (with or without device)? Independent   Stairs: Did the patient need assistance with internal or external stairs (with or without device)? Independent   Functional Cognition: Did the patient need help planning regular tasks such as shopping or remembering to take medications? Independent   Home Assistive Devices / Equipment Home Assistive Devices/Equipment: None   Prior Device Use: Indicate devices/aids used by the patient prior to current illness, exacerbation or injury? None of the above   Current Functional Level Cognition   Overall Cognitive Status: Impaired/Different from baseline Difficult to assess due to: Impaired communication Orientation Level: Oriented  X4 Following Commands: Follows one step commands with increased time General Comments: Responds well to pbject cues and mimicing actions for THEREX. Responds verbally to direct questions    Extremity Assessment (includes Sensation/Coordination)   Upper Extremity Assessment: RUE deficits/detail,LUE deficits/detail RUE Deficits / Details: 2/5 grossly. Grip 3/5 LUE Deficits / Details: 2/5 grossly. Grip 3/5  Lower Extremity Assessment:  (3-/5 LLE LAQ, 2/5 RLE LAQ, difficult to assess BLE sensation & proprioception)     ADLs   Overall ADL's : Needs assistance/impaired General ADL Comments: MIN A self-feeding seated EOB using dominant LUE - assist for balance.     Mobility   Overal bed mobility: Needs Assistance Bed Mobility: Supine to Sit,Sit to Supine Supine to sit: Mod assist,HOB elevated Sit to supine: Mod assist,HOB elevated General bed mobility comments: Asssit for trunk support exiting bed and BLE return to bed     Transfers   Overall transfer level: Needs assistance Equipment used: 2 person hand held assist,Rolling walker (2 wheeled) Transfers: Sit to/from Stand Sit to Stand: +2 physical assistance,+2 safety/equipment,Min assist,Mod assist General transfer comment: pt was able to stand with BUE HHA then stand to RW. +2 for safety however does not require +2 assistance.     Ambulation / Gait / Stairs / Wheelchair Mobility   Ambulation/Gait General Gait Details: unable to advance due to poor motor planning and pt fatigue noted with short standing  tolerance.     Posture / Balance Dynamic Sitting Balance Sitting balance - Comments: intermittent CGA dynamic sitting Balance Overall balance assessment: Needs assistance Sitting-balance support: Single extremity supported,Feet supported Sitting balance-Leahy Scale: Fair Sitting balance - Comments: intermittent CGA dynamic sitting Postural control: Left lateral lean Standing balance support: Bilateral upper extremity supported,During  functional activity Standing balance-Leahy Scale: Poor Standing balance comment: needs constant assistance to maintain standing balance     Special needs/care consideration      Previous Home Environment  Living Arrangements: Spouse/significant other  Lives With: Significant other Available Help at Discharge: Family,Available 24 hours/day (sister, [name], to arrange 24/7 assist at her home or his home) Type of Home: Mobile home Care Facility Name: [facility] Home Layout: One level Home Access: Ramped entrance Bathroom Shower/Tub: Engineer, manufacturing systems: Standard Bathroom Accessibility: Yes How Accessible: Accessible via walker Home Care Services: No Additional Comments: admitted from [facility] where [facility]   Discharge Living Setting Plans for Discharge Living Setting: Patient's home,Mobile Home Type of Home at Discharge: Mobile home Discharge Home Layout: One level Discharge Home Access: Ramped entrance Discharge Bathroom Shower/Tub: Tub/shower unit Discharge Bathroom Toilet: Standard Discharge Bathroom Accessibility: Yes How Accessible: Accessible via walker Does the patient have any problems obtaining your medications?: No   Social/Family/Support Systems Contact Information: sister, [name] Anticipated Caregiver: sister and family memebrs Anticipated Caregiver's Contact Information: [phone] Ability/Limitations of Caregiver: [name] works, but will arrnage needed caregiver support Caregiver Availability: 24/7 Discharge Plan Discussed with Primary Caregiver: Yes Is Caregiver In Agreement with Plan?: Yes Does Caregiver/Family have Issues with Lodging/Transportation while Pt is in Rehab?: No   Goals Patient/Family Goal for Rehab: supervision to min asist with PT, OT, and SLP Expected length of stay: ELOS 2-3 weeks Pt/Family Agrees to Admission and willing to participate: Yes Program Orientation Provided & Reviewed with  Pt/Caregiver Including Roles  & Responsibilities: Yes   Decrease burden of Care through IP rehab admission: n/a   Possible need for SNF placement upon discharge:if does not reach a level to for d/c home, family likely would consider further rehab at SNF   Patient Condition: Patient's condition remains as documented in consult dated 01/08/2020 for which he is deemed appropriate for inpatient acute rehabilitation admission. I will arrange admit for today.   Preadmission Screen Completed By:  Clois Dupes, RN, 01/09/2020 10:41 AM ______________________________________________________________________   Discussed status with Dr. Allena Katz on 01/09/2020 at  1041 and received approval for admission today.   Admission Coordinator:  Clois Dupes, time 3382 Date 01/09/2020           Revision History                                       Note Details  Author Allena Katz, Maryln Gottron, MD File Time 01/09/2020 10:45 AM  Author Type Physician Status Addendum  Last Editor Marcello Fennel, MD Service Physical Medicine and Rehabilitation  [facility] Acct # [id number] Admit Date 01/09/2020

## 2020-01-09 NOTE — H&P (Signed)
Physical Medicine and Rehabilitation Admission H&P    Chief Complaint  Patient presents with  . Abdominal Pain  . 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  History taken from chart review due to cognition.  He was initially admitted to [facility] after his most recent CVA and was discharged to skilled nursing facility where he developed abdominal pain nausea and vomiting.  Patient with prior CVA and lives with spouse.  1 level home ramped entrance.  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  He presented to [facility] 12/31/2019.  EEG negative for seizures.  CT/MRI showed subtly increased diffusion abnormality involving the left pons middle cerebellar peduncle and adjacent left cerebellar hemisphere.  No other acute abnormality.  There was a few scattered remote lacunar infarcts about the left basal ganglia and corona radiata.  CT of abdomen pelvis showed findings suggestive of enteritis.  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  Short segment moderate stenosis involving the mid right M1 segment.  Echocardiogram with EF of 60-65%, no wall motion abnormalities grade 1 diastolic dysfunction.  Follow-up MRI completed findings consistent with left PICA infarction advised continue aspirin and Plavix.  Admission chemistries unremarkable aside glucose 169, creatinine 1.48, lipase 33, urinalysis negative nitrite with urine culture no growth, lactic acid 4.4, C. difficile negative, blood cultures no growth to date.  Hospital course work-up for sepsis related to acute enteritis he was placed on  azithromycin x3 days as well as IV gentle hydration.  Latest creatinine improved 1.07 after gentle hydration.  He is currently on a dysphagia #1 honey thick liquid diet.  Subcutaneous Lovenox for DVT prophylaxis.  Therapy evaluations completed and patient was admitted for a comprehensive rehab program.  Please see preadmission assessment from earlier today as well.  Review of Systems  Unable to perform ROS: Mental acuity   Past medical history: Hyperlipidemia, hypertension, CVA Past surgical history: None on file, unable to obtain from patient Family history: Not on file, unable to obtain from patient. Social History:  has an unknown smoking status. He has never used smokeless tobacco. He reports previous alcohol use. He reports previous drug use. Unable to obtain from patient.  Allergies: No Known Allergies Medications Prior to Admission  Medication Sig Dispense Refill  . Amino Acids-Protein Hydrolys (FEEDING SUPPLEMENT, PRO-STAT SUGAR FREE 64,) LIQD Take 30 mLs by mouth daily.    Marland Kitchen amLODipine (NORVASC) 5 MG tablet Take 5 mg by mouth daily.    Marland Kitchen aspirin 81 MG chewable tablet Chew 81 mg by mouth daily.    Marland Kitchen atorvastatin (LIPITOR) 80 MG tablet Take 80 mg by mouth daily at 6 PM.    . cromolyn (OPTICROM) 4 % ophthalmic solution Place 2 drops into the left eye 4 (four) times daily. 0600, 1200, 1800, 0000    . fluticasone (FLONASE) 50 MCG/ACT nasal spray Place 2 sprays into both nostrils daily.    Marland Kitchen lisinopril (ZESTRIL) 40 MG tablet Take 40 mg by mouth daily.    Marland Kitchen loratadine (CLARITIN) 10 MG tablet Take 10 mg by mouth daily.    . polyethylene glycol (MIRALAX / GLYCOLAX) 17 g  packet Take 17 g by mouth daily. MIX IN 4-8 OZ OF FLUID    . senna-docusate (SENOKOT-S) 8.6-50 MG tablet Take 2 tablets by mouth 2 (two) times daily.    . clopidogrel (PLAVIX) 75 MG tablet Take 75 mg by mouth daily. 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 Pt admitted from SNF - unclear level of function at SNF  Functional Status:  Mobility: Bed Mobility Overal bed mobility: Needs Assistance Bed Mobility: Supine to Sit,Sit to Supine Supine to sit: Mod assist,HOB elevated Sit to supine: Mod assist,HOB elevated General bed mobility comments: Asssit for trunk support exiting bed and BLE return to bed Transfers Overall transfer level: Needs assistance Equipment used: 2 person hand held assist,Rolling walker (2 wheeled) Transfers: Sit to/from Stand Sit to Stand: +2 physical assistance,+2 safety/equipment,Min assist,Mod assist General transfer comment: pt was able to stand with BUE HHA then stand to RW. +2 for safety however does not require +2 assistance. Ambulation/Gait General Gait Details: unable to advance due to poor motor planning and pt fatigue noted with short standing tolerance.    ADL: ADL Overall ADL's : Needs assistance/impaired General ADL Comments: MIN A self-feeding seated EOB using dominant LUE - assist for balance.  Cognition: Cognition Overall Cognitive Status: Impaired/Different from baseline Orientation Level: Oriented  X4 Cognition Arousal/Alertness: Awake/alert Behavior During Therapy: Flat affect Overall Cognitive Status: Impaired/Different from baseline Area of Impairment: Following commands,Problem solving Following Commands: Follows one step commands with increased time Problem Solving: Decreased initiation,Requires verbal cues,Requires tactile cues General Comments: Responds well to pbject cues and mimicing actions for THEREX. Responds verbally to direct questions Difficult to assess due to: Impaired communication  Physical Exam: Blood pressure (!) 147/91, pulse 88, temperature 99 F (37.2 C), temperature source Oral, resp. rate 18, height 6' (1.829 m), weight 70.1 kg, SpO2 96 %. Physical Exam Vitals reviewed.  Constitutional:      General: He is not in acute distress.    Appearance: He is not ill-appearing.  HENT:     Head: Normocephalic and atraumatic.     Right Ear: External ear normal.     Left Ear: External ear normal.     Nose: Nose normal.  Eyes:     General:        Right eye: No discharge.        Left eye: No discharge.     Extraocular Movements: Extraocular movements intact.  Cardiovascular:     Rate and Rhythm: Normal rate and regular rhythm.  Pulmonary:     Effort: Pulmonary effort is normal. No respiratory distress.     Breath sounds: No stridor.  Abdominal:     General: Abdomen is flat. Bowel sounds are normal. There is no distension.  Musculoskeletal:  Cervical back: Neck supple.     Comments: No edema or tenderness in extremities  Skin:    General: Skin is warm and dry.  Neurological:     Mental Status: He is alert.     Comments: Alert Limited attention and delay in processing.   ?  Expressive aphasia Follows simple commands. Motor: Appears to be 5/5 throughout  Psychiatric:        Mood and Affect: Affect is blunt.        Speech: Speech is delayed and slurred.        Behavior: Behavior is slowed.        Cognition and Memory: Cognition is impaired.      Results for orders placed or performed during the hospital encounter of 12/31/19 (from the past 48 hour(s))  Magnesium     Status: None   Collection Time: 01/08/20  4:37 AM  Result Value Ref Range   Magnesium 1.8 1.7 - 2.4 mg/dL    Comment: Performed at [facility], [address] [place], [place] [address]  Basic metabolic panel     Status: Abnormal   Collection Time: 01/08/20  4:37 AM  Result Value Ref Range   Sodium 141 135 - 145 mmol/L   Potassium 3.4 (L) 3.5 - 5.1 mmol/L   Chloride 111 98 - 111 mmol/L   CO2 22 22 - 32 mmol/L   Glucose, Bld 116 (H) 70 - 99 mg/dL    Comment: Glucose reference range applies only to samples taken after fasting for at least 8 hours.   BUN 19 8 - 23 mg/dL   Creatinine, Ser 2.59 0.61 - 1.24 mg/dL   Calcium 9.0 8.9 - 56.3 mg/dL   GFR, Estimated >87 >56 mL/min    Comment: (NOTE) Calculated using the CKD-EPI Creatinine Equation (2021)    Anion gap 8 5 - 15    Comment: Performed at [facility], [address] [place], [place] [address]  Magnesium     Status: None   Collection Time: 01/09/20  3:40 AM  Result Value Ref Range   Magnesium 1.9 1.7 - 2.4 mg/dL    Comment: Performed at [facility], [address] [place], [place] [address]  Basic metabolic panel     Status: Abnormal   Collection Time: 01/09/20  3:40 AM  Result Value Ref Range   Sodium 140 135 - 145 mmol/L   Potassium 3.9 3.5 - 5.1 mmol/L   Chloride 109 98 - 111 mmol/L   CO2 24 22 - 32 mmol/L   Glucose, Bld 115 (H) 70 - 99 mg/dL    Comment: Glucose reference range applies only to samples taken after fasting for at least 8 hours.   BUN 14 8 - 23 mg/dL   Creatinine, Ser 1.66 0.61 - 1.24 mg/dL   Calcium 8.9 8.9 - 06.3 mg/dL   GFR, Estimated >01 >60 mL/min    Comment: (NOTE) Calculated using the CKD-EPI Creatinine Equation (2021)    Anion gap 7 5 - 15    Comment: Performed at [facility], [address] [place], [place] [address]    No results found.     Medical Problem List and Plan: 1.  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  -patient may shower  -ELOS/Goals: 14-17 days/Min A  Admit to CIR 2.  Antithrombotics: -DVT/anticoagulation: Lovenox  -antiplatelet therapy: Aspirin 81 mg daily and Plavix 75  mg daily x3 months then aspirin daily 3. Pain Management: Tylenol as needed 4. Mood: Provide emotional support  -antipsychotic agents: N/A 5. Neuropsych: This patient is not capable of making decisions on his own behalf. 6. Skin/Wound Care: Routine skin checks 7. Fluids/Electrolytes/Nutrition: Routine and outs.  CMP ordered for tomorrow 8.  Hypertension.  Norvasc 5 mg daily.    Monitor with increased mobility 9.  Hyperlipidemia.  Lipitor 10.  BPH.  Flomax 0.4 mg daily.    PVRs ordered 11.  Severe sepsis with acute enteritis.  Completed course of azithromycin.  Monitor hydration. 12.  AKI resolved.  Follow-up chemistries  Charlton Amor, PA-C 01/09/2020  I have personally performed a face to face diagnostic evaluation, including, but not limited to relevant history and physical exam findings, of this patient and developed relevant assessment and plan.  Additionally, I have reviewed and concur with the physician assistant's documentation above.  Maryla Morrow, MD, ABPMR

## 2020-01-09 NOTE — Progress Notes (Signed)
Inpatient Rehabilitation Admissions Coordinator         CIR bed at [facility] in [place] is available to admit patient today. I contacted his sister, [name] and she is aware and in agreement . I will contact acute team and TOC to arrange admit today. He will be admitted to [address] and Dr. Maryla Morrow will be receiving MD to rehab.  Ottie Glazier, RN, MSN Rehab Admissions Coordinator [phone] 01/09/2020 10:05 AM

## 2020-01-09 NOTE — Progress Notes (Signed)
Patient arrived to unit via carelink. No s/s of distress noted at this time. Patient in bed with bed alarm on, call bell in reach with bed in lowest position. Jay Schlichter, LPN

## 2020-01-09 NOTE — Progress Notes (Signed)
Alexander Chin, MD  Physician  Physical Medicine and Rehabilitation  Consult Note      Signed  Date of Service:  01/08/2020  4:00 PM      Related encounter: ED to Hosp-Admission (Discharged) from 12/31/2019 in [facility] PCU       Signed      Expand All Collapse All     Show:Clear all [x] Manual[x] Template[] Copied  Added by: [x] Raulkar, , MD   [] Hover for details           Physical Medicine and Rehabilitation Consult Reason for Consult: Debility 2/2 sepsis Referring Physician: , MD     HPI: Alexander Byrd is a 69 y.o. 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 He has been in a SNF for the past three weeks and was admitted to [facility] with abdominal pain, nausea, and vomiting and is being treated for sepsis secondary to E.Coli. MRI brain shows increased diffusion abnormality involving the pons, left middle cerebellar peduncle, and left cerebella hemisphere, concerning for seizures/encephallitis. Physical Medicine & Rehabilitation was consulted to assess candidacy for CIR given debility. Currently with short standing tolerance, disorientation, 2 person Min-ModA for transfers.      Review of Systems  Constitutional: Negative.   HENT: Negative.   Eyes: Negative.   Respiratory: Negative.   Cardiovascular: Negative.   Gastrointestinal: Negative.   Genitourinary: Negative.   Musculoskeletal: Negative.   Skin: Negative.   Neurological: Positive for weakness.  Endo/Heme/Allergies: Negative.   Psychiatric/Behavioral: Positive for memory loss.    History reviewed. No pertinent past medical history. History reviewed. No pertinent surgical history. History reviewed. No pertinent family history. Social History:  has an unknown smoking status. He has never used smokeless tobacco. He reports previous alcohol use. He reports previous  drug use. Allergies: No Known Allergies       Medications Prior to Admission  Medication Sig Dispense Refill  . Amino Acids-Protein Hydrolys (FEEDING SUPPLEMENT, PRO-STAT SUGAR FREE 64,) LIQD Take 30 mLs by mouth daily.      66 amLODipine (NORVASC) 5 MG tablet Take 5 mg by mouth daily.      [facility] aspirin 81 MG chewable tablet Chew 81 mg by mouth daily.      [facility] atorvastatin (LIPITOR) 80 MG tablet Take 80 mg by mouth daily at 6 PM.      . cromolyn (OPTICROM) 4 % ophthalmic solution Place 2 drops into the left eye 4 (four) times daily. 0600, 1200, 1800, 0000      . fluticasone (FLONASE) 50 MCG/ACT nasal spray Place 2 sprays into both nostrils daily.      Marland Kitchen lisinopril (ZESTRIL) 40 MG tablet Take 40 mg by mouth daily.      Marland Kitchen loratadine (CLARITIN) 10 MG tablet Take 10 mg by mouth daily.      . polyethylene glycol (MIRALAX / GLYCOLAX) 17 g packet Take 17 g by mouth daily. MIX IN 4-8 OZ OF FLUID      . senna-docusate (SENOKOT-S) 8.6-50 MG tablet Take 2 tablets by mouth 2 (two) times daily.      . clopidogrel (PLAVIX) 75 MG tablet Take 75 mg by mouth daily. 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 Pt admitted from SNF - unclear level of function at SNF Functional Status:  Mobility: Bed Mobility Overal bed mobility: Needs Assistance Bed Mobility: Supine to Sit,Sit to Supine Supine to sit: Mod assist,HOB elevated Sit to supine: Mod assist,HOB elevated General bed mobility  comments: Asssit for trunk support exiting bed and BLE return to bed Transfers Overall transfer level: Needs assistance Equipment used: 2 person hand held assist,Rolling walker (2 wheeled) Transfers: Sit to/from Stand Sit to Stand: +2 physical assistance,+2 safety/equipment,Min assist,Mod assist General transfer comment: pt was able to stand with BUE HHA then stand to RW. +2 for safety however does not require +2 assistance. Ambulation/Gait General Gait Details: unable to advance due to poor motor planning and pt fatigue noted with short standing tolerance.   ADL: ADL Overall ADL's : Needs assistance/impaired General ADL Comments: MIN A self-feeding seated EOB using dominant LUE - assist for balance.   Cognition: Cognition Overall Cognitive Status: Impaired/Different from baseline Orientation Level: Oriented X4 Cognition Arousal/Alertness: Awake/alert Behavior During Therapy: Flat affect Overall Cognitive Status: Impaired/Different from baseline Area of Impairment: Following commands,Problem solving Following Commands: Follows one step commands with increased time Problem Solving: Decreased initiation,Requires verbal cues,Requires tactile cues General Comments: Responds well to pbject cues and mimicing actions for THEREX. Responds verbally to direct questions Difficult to assess due to: Impaired communication   Blood pressure (!) 156/90, pulse 94, temperature 98.7 F (37.1 C), temperature source Oral, resp. rate 18, height 6' (1.829 m), weight 68.6 kg, SpO2 95 %. Physical Exam  General: Alert and oriented x 1, No apparent distress HEENT: Head is normocephalic, atraumatic, PERRLA, EOMI, sclera anicteric, oral mucosa pink and moist, dentition intact, ext ear canals clear,  Neck: Supple without JVD or lymphadenopathy Heart: Tachycardic. No murmurs rubs or gallops Chest: CTA bilaterally without wheezes, rales, or rhonchi; no distress Abdomen: Soft, non-tender, non-distended, bowel  sounds positive. Extremities: No clubbing, cyanosis, or edema. Pulses are 2+ Skin: Clean and intact without signs of breakdown Neuro: Pt is disoriented with poor attention, delayed processing, dysarthric speech, comprehension and repetition is intact. 5/5 strength throughout.  Psych: Pt's affect is flat. Pt is cooperative   Lab Results Last 24 Hours       Results for orders placed or performed during the hospital encounter of 12/31/19 (from the past 24 hour(s))  Magnesium     Status: None    Collection Time: 01/08/20  4:37 AM  Result Value Ref Range    Magnesium 1.8 1.7 - 2.4 mg/dL  Basic metabolic panel     Status: Abnormal    Collection Time: 01/08/20  4:37 AM  Result Value Ref Range    Sodium 141 135 - 145 mmol/L    Potassium 3.4 (L) 3.5 - 5.1 mmol/L    Chloride 111 98 - 111 mmol/L    CO2 22 22 - 32 mmol/L    Glucose, Bld 116 (H) 70 - 99 mg/dL    BUN 19 8 - 23 mg/dL    Creatinine, Ser 0.24 0.61 - 1.24 mg/dL    Calcium 9.0 8.9 - 09.7 mg/dL    GFR, Estimated >35 >32 mL/min    Anion gap 8 5 - 15      Imaging Results (Last  48 hours)  No results found.       Assessment/Plan: Diagnosis: L PICA occlusion 1. Does the need for close, 24 hr/day medical supervision in concert with the patient's rehab needs make it unreasonable for this patient to be served in a less intensive setting? Yes 2. Co-Morbidities requiring supervision/potential complications: HTN, CAD, HTN, prior stroke 3 weeks ago, gastroenteritis, AKI, tachycardia, tachypnea 3. Due to bladder management, bowel management, safety, skin/wound care, disease management, medication administration, pain management and patient education, does the patient require 24 hr/day rehab nursing? Yes 4. Does the patient require coordinated care of a physician, rehab nurse, therapy disciplines of PT, OT, SLP to address physical and functional deficits in the context of the above medical diagnosis(es)? Yes Addressing deficits in the following  areas: balance, endurance, locomotion, strength, transferring, bowel/bladder control, bathing, dressing, feeding, grooming, toileting, cognition and psychosocial support 5. Can the patient actively participate in an intensive therapy program of at least 3 hrs of therapy per day at least 5 days per week? Yes 6. The potential for patient to make measurable gains while on inpatient rehab is excellent 7. Anticipated functional outcomes upon discharge from inpatient rehab are min assist  with PT, min assist with OT, min assist with SLP. 8. Estimated rehab length of stay to reach the above functional goals is: 2-3 weeks 9. Anticipated discharge destination: Home 10. Overall Rehab/Functional Prognosis: good   RECOMMENDATIONS: This patient's condition is appropriate for continued rehabilitative care in the following setting: CIR Patient has agreed to participate in recommended program. N/A Note that insurance prior authorization may be required for reimbursement for recommended care.   Comment:  1) L PICA occlusion: Mr. Kaneshiro would benefit from intensive inpatient rehabilitation. 2) Cognitive deficits: He currently does not have capacity to make his own decisions and the decision to participate in CIR will need to involve his family. 3) Hypertension: gradual normotension to prevent hypoperfusion 4) Impaired mobility and ADLs: Admission coordinator to follow with patient's sister regarding admission to CIR upon bed availability.    Alexander Chin, MD 01/08/2020           Routing History              Note Details  Author Alexander Chin, MD File Time 01/08/2020  4:13 PM  Author Type Physician Status Signed  Last Editor Alexander Chin, MD Service Physical Medicine and Rehabilitation  Hospital Acct # [id number] Admit Date 01/09/2020

## 2020-01-09 NOTE — H&P (Signed)
Physical Medicine and Rehabilitation Admission H&P    Chief Complaint  Patient presents with  . Abdominal Pain  . 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  History taken from chart review due to cognition.  He was initially admitted to [facility] after his most recent CVA and was discharged to skilled nursing facility where he developed abdominal pain nausea and vomiting.  Patient with prior CVA and lives with spouse.  1 level home ramped entrance.  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  He presented to [facility] 12/31/2019.  EEG negative for seizures.  CT/MRI showed subtly increased diffusion abnormality involving the left pons middle cerebellar peduncle and adjacent left cerebellar hemisphere.  No other acute abnormality.  There was a few scattered remote lacunar infarcts about the left basal ganglia and corona radiata.  CT of abdomen pelvis showed findings suggestive of enteritis.  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  Short segment moderate stenosis involving the mid right M1 segment.  Echocardiogram with EF of 60-65%, no wall motion abnormalities grade 1 diastolic dysfunction.  Follow-up MRI completed findings consistent with left PICA infarction advised continue aspirin and Plavix.  Admission chemistries unremarkable aside glucose 169, creatinine 1.48, lipase 33, urinalysis negative nitrite with urine culture no growth, lactic acid 4.4, C. difficile negative, blood cultures no growth to date.  Hospital course work-up for sepsis related to acute enteritis he was placed on  azithromycin x3 days as well as IV gentle hydration.  Latest creatinine improved 1.07 after gentle hydration.  He is currently on a dysphagia #1 honey thick liquid diet.  Subcutaneous Lovenox for DVT prophylaxis.  Therapy evaluations completed and patient was admitted for a comprehensive rehab program.  Please see preadmission assessment from earlier today as well.  Review of Systems  Unable to perform ROS: Mental acuity   Past medical history: Hyperlipidemia, hypertension, CVA Past surgical history: None on file, unable to obtain from patient Family history: Not on file, unable to obtain from patient. Social History:  has an unknown smoking status. He has never used smokeless tobacco. He reports previous alcohol use. He reports previous drug use. Unable to obtain from patient.  Allergies: No Known Allergies Medications Prior to Admission  Medication Sig Dispense Refill  . Amino Acids-Protein Hydrolys (FEEDING SUPPLEMENT, PRO-STAT SUGAR FREE 64,) LIQD Take 30 mLs by mouth daily.    Marland Kitchen amLODipine (NORVASC) 5 MG tablet Take 5 mg by mouth daily.    Marland Kitchen aspirin 81 MG chewable tablet Chew 81 mg by mouth daily.    Marland Kitchen atorvastatin (LIPITOR) 80 MG tablet Take 80 mg by mouth daily at 6 PM.    . cromolyn (OPTICROM) 4 % ophthalmic solution Place 2 drops into the left eye 4 (four) times daily. 0600, 1200, 1800, 0000    . fluticasone (FLONASE) 50 MCG/ACT nasal spray Place 2 sprays into both nostrils daily.    Marland Kitchen lisinopril (ZESTRIL) 40 MG tablet Take 40 mg by mouth daily.    Marland Kitchen loratadine (CLARITIN) 10 MG tablet Take 10 mg by mouth daily.    . polyethylene glycol (MIRALAX / GLYCOLAX) 17 g  packet Take 17 g by mouth daily. MIX IN 4-8 OZ OF FLUID    . senna-docusate (SENOKOT-S) 8.6-50 MG tablet Take 2 tablets by mouth 2 (two) times daily.    . clopidogrel (PLAVIX) 75 MG tablet Take 75 mg by mouth daily. 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 Pt admitted from SNF - unclear level of function at SNF  Functional Status:  Mobility: Bed Mobility Overal bed mobility: Needs Assistance Bed Mobility: Supine to Sit,Sit to Supine Supine to sit: Mod assist,HOB elevated Sit to supine: Mod assist,HOB elevated General bed mobility comments: Asssit for trunk support exiting bed and BLE return to bed Transfers Overall transfer level: Needs assistance Equipment used: 2 person hand held assist,Rolling walker (2 wheeled) Transfers: Sit to/from Stand Sit to Stand: +2 physical assistance,+2 safety/equipment,Min assist,Mod assist General transfer comment: pt was able to stand with BUE HHA then stand to RW. +2 for safety however does not require +2 assistance. Ambulation/Gait General Gait Details: unable to advance due to poor motor planning and pt fatigue noted with short standing tolerance.    ADL: ADL Overall ADL's : Needs assistance/impaired General ADL Comments: MIN A self-feeding seated EOB using dominant LUE - assist for balance.  Cognition: Cognition Overall Cognitive Status: Impaired/Different from baseline Orientation Level: Oriented  X4 Cognition Arousal/Alertness: Awake/alert Behavior During Therapy: Flat affect Overall Cognitive Status: Impaired/Different from baseline Area of Impairment: Following commands,Problem solving Following Commands: Follows one step commands with increased time Problem Solving: Decreased initiation,Requires verbal cues,Requires tactile cues General Comments: Responds well to pbject cues and mimicing actions for THEREX. Responds verbally to direct questions Difficult to assess due to: Impaired communication  Physical Exam: Blood pressure (!) 147/91, pulse 88, temperature 99 F (37.2 C), temperature source Oral, resp. rate 18, height 6' (1.829 m), weight 70.1 kg, SpO2 96 %. Physical Exam Vitals reviewed.  Constitutional:      General: He is not in acute distress.    Appearance: He is not ill-appearing.  HENT:     Head: Normocephalic and atraumatic.     Right Ear: External ear normal.     Left Ear: External ear normal.     Nose: Nose normal.  Eyes:     General:        Right eye: No discharge.        Left eye: No discharge.     Extraocular Movements: Extraocular movements intact.  Cardiovascular:     Rate and Rhythm: Normal rate and regular rhythm.  Pulmonary:     Effort: Pulmonary effort is normal. No respiratory distress.     Breath sounds: No stridor.  Abdominal:     General: Abdomen is flat. Bowel sounds are normal. There is no distension.  Musculoskeletal:  Cervical back: Neck supple.     Comments: No edema or tenderness in extremities  Skin:    General: Skin is warm and dry.  Neurological:     Mental Status: He is alert.     Comments: Alert Limited attention and delay in processing.   ?  Expressive aphasia Follows simple commands. Motor: Appears to be 5/5 throughout  Psychiatric:        Mood and Affect: Affect is blunt.        Speech: Speech is delayed and slurred.        Behavior: Behavior is slowed.        Cognition and Memory: Cognition is impaired.      Results for orders placed or performed during the hospital encounter of 12/31/19 (from the past 48 hour(s))  Magnesium     Status: None   Collection Time: 01/08/20  4:37 AM  Result Value Ref Range   Magnesium 1.8 1.7 - 2.4 mg/dL    Comment: Performed at [facility], [address] [place], [place] [address]  Basic metabolic panel     Status: Abnormal   Collection Time: 01/08/20  4:37 AM  Result Value Ref Range   Sodium 141 135 - 145 mmol/L   Potassium 3.4 (L) 3.5 - 5.1 mmol/L   Chloride 111 98 - 111 mmol/L   CO2 22 22 - 32 mmol/L   Glucose, Bld 116 (H) 70 - 99 mg/dL    Comment: Glucose reference range applies only to samples taken after fasting for at least 8 hours.   BUN 19 8 - 23 mg/dL   Creatinine, Ser 2.59 0.61 - 1.24 mg/dL   Calcium 9.0 8.9 - 56.3 mg/dL   GFR, Estimated >87 >56 mL/min    Comment: (NOTE) Calculated using the CKD-EPI Creatinine Equation (2021)    Anion gap 8 5 - 15    Comment: Performed at [facility], [address] [place], [place] [address]  Magnesium     Status: None   Collection Time: 01/09/20  3:40 AM  Result Value Ref Range   Magnesium 1.9 1.7 - 2.4 mg/dL    Comment: Performed at [facility], [address] [place], [place] [address]  Basic metabolic panel     Status: Abnormal   Collection Time: 01/09/20  3:40 AM  Result Value Ref Range   Sodium 140 135 - 145 mmol/L   Potassium 3.9 3.5 - 5.1 mmol/L   Chloride 109 98 - 111 mmol/L   CO2 24 22 - 32 mmol/L   Glucose, Bld 115 (H) 70 - 99 mg/dL    Comment: Glucose reference range applies only to samples taken after fasting for at least 8 hours.   BUN 14 8 - 23 mg/dL   Creatinine, Ser 1.66 0.61 - 1.24 mg/dL   Calcium 8.9 8.9 - 06.3 mg/dL   GFR, Estimated >01 >60 mL/min    Comment: (NOTE) Calculated using the CKD-EPI Creatinine Equation (2021)    Anion gap 7 5 - 15    Comment: Performed at [facility], [address] [place], [place] [address]    No results found.     Medical Problem List and Plan: 1.  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  -patient may shower  -ELOS/Goals: 14-17 days/Min A  Admit to CIR 2.  Antithrombotics: -DVT/anticoagulation: Lovenox  -antiplatelet therapy: Aspirin 81 mg daily and Plavix 75  mg daily x3 months then aspirin daily 3. Pain Management: Tylenol as needed 4. Mood: Provide emotional support  -antipsychotic agents: N/A 5. Neuropsych: This patient is not capable of making decisions on his own behalf. 6. Skin/Wound Care: Routine skin checks 7. Fluids/Electrolytes/Nutrition: Routine and outs.  CMP ordered for tomorrow 8.  Hypertension.  Norvasc 5 mg daily.    Monitor with increased mobility 9.  Hyperlipidemia.  Lipitor 10.  BPH.  Flomax 0.4 mg daily.    PVRs ordered 11.  Severe sepsis with acute enteritis.  Completed course of azithromycin.  Monitor hydration. 12.  AKI resolved.  Follow-up chemistries  Charlton Amor, PA-C 01/09/2020  I have personally performed a face to face diagnostic evaluation, including, but not limited to relevant history and physical exam findings, of this patient and developed relevant assessment and plan.  Additionally, I have reviewed and concur with the physician assistant's documentation above.  Maryla Morrow, MD, ABPMR  The patient's status has not changed. Any changes from the pre-admission screening or documentation from the acute chart are noted above.   Maryla Morrow, MD, ABPMR

## 2020-01-09 NOTE — Progress Notes (Signed)
SLP Cancellation Note  Patient Details Name: Alexander Byrd MRN: [id number] DOB: 13-Jan-1951   Cancelled treatment:       Reason Eval/Treat Not Completed:  (chart reviewed; consulted Team). Pt is d/t d/c to CIR at [facility] today; bed available. Recommend continued ST f/u w/ objective swallow assessment as he continues to improve medically, Cognitively.  Pt needs encouragement w/ all po intake at times; he has been tolerating the current dysphagia diet w/ no reports of overt s/s of aspiration.  Recommend continue current diet w/ aspiration precautions; frequent oral care.     Jerilynn Som, MS, CCC-SLP Speech Language Pathologist Rehab Services [phone] [facility] 01/09/2020, 10:15 AM

## 2020-01-09 NOTE — Discharge Summary (Signed)
Physician Discharge Summary  Alexander Byrd QIO:[id number] DOB: 1950/02/22 DOA: 12/31/2019  PCP: Alexander Bill., MD  Admit date: 12/31/2019 Discharge date: 01/09/2020  Admitted From: SNF Disposition:  CIR  Recommendations for Outpatient Follow-up:  1. Follow up with PCP in 1-2 weeks 2. Please obtain BMP/CBC in one week 3. Please follow up with neurology, Alexander Byrd with [facility] 4. Patient to continue Aspirin AND Plavix for 3 months, then stop Plavix and continue aspirin  Home Health: No Equipment/Devices: None   Discharge Condition: Stable  CODE STATUS: Full  Diet recommendation: Dysphagia level 1 (pureed) with honey thick liquids by spoon only  Discharge Diagnoses: Active Problems:   Sepsis ([place])    Summary of HPI and Hospital Course:   Narrative by Dr. Si Byrd 12/14: Alexander Koyanagi Matthewsis a 69 y.o.malewith medical history significant ofrecent CVA about 3 weeks ago with residual left-sided weakness and slurred speech (he was admitted at [facility]), has been in an SNF for the past 3 weeks comes to the hospital with complaints of abdominal pain, nausea and vomiting.  Initially met severe sepsis criteria with tachypnea, tachycardia, lactic acidosis with lactic acid peaked at 6, and encephalopathy received IV fluid and broad-spectrum antibiotics.  GI pathogen with pathogenic E. Coli, blood and urine cultures pending. Apparently blood cultures were drawn after starting antibiotics. CT head without any acute changes but MRI brain with some questionable increased diffusion abnormality involving pons, left middle cerebellar peduncle and left cerebellar hemisphere, concern for seizures/encephalitis.  There is 1 nursing concern when patient with persistent right gaze and rapid nystagmus concerning for seizure and he was started on Keppra.  Neurology was consulted. CT abdomen with concern of enteritis. Speech evaluated him and recommending dysphagia 1 diet. 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 Repeat MRI performed 12/9 with L cerebellar PICA stroke. Seen by neurology, out of window for TPA, discussed w/ stroke team at [facility] who do not think needs acute intervention at this time. Stable neuro exam. Transferred to ICU for close neuro monitoring 12/9-12/11, returned to medical floor on 12/11.  TTE w/ grade 1 dd, o/w unremarkable. Slowly improving --Continue with aspirin Plavix x 3 months, then aspirin monotherapy --Continue Lipitor --SLP evaluated, ok for dysphagia 1 diet for now, honey thick liquids by spoon --Follow up with dedicated stroke specialist such as Alexander Byrd @ [facility]   Severe sepsis with acute enteritis.  POA., resolved.  GI pathogen with epec. c diff neg. Lactic acid has normalized. Ct showing enteritis. Urine culture ngtd. Blood cultures also ngtd. Is s/p cefepime/flagyl. Hemodynamically stable.  - treated with 3 days azithromycin & IV hydration   Encephalopathy. POA, Improved. Likely 2/2 series of CVAs, acute infection likely contributory. Improving.   AKI.  Resolved. Creatinine recent [facility] hospitalization trended around 1.1.  --Check BMP in 1 week   Hypokalemia - on daily supplementation.  Check BMP in 1 week. Hypomagnesemia - replaced.. Check Mg level in 1 week   Partial urinary retention.  CT abdomen with prostatomegaly and bladder scan with more than 400 cc requiring one time in and out catheter.  There is some concern of urinary retention.  That can also contribute to AKI.  Apparently multiple soiled diapers, so most likely partial retention or incomplete emptying of bladder. Does not cooperate for post-void  residual scan. --Started on Flomax with improvement, continue.   --Consider urology  evaluation if recurrent or persistent issue   Essential hypertension.  -Resumed on Byrd dose amlodipine 5 mg daily.  Prior lisinopril held to avoid over-treating.  Hyperlipidemia. -Continue with statin    Discharge Instructions   Discharge Instructions    Call MD for:  extreme fatigue   Complete by: As directed    Call MD for:  persistant dizziness or light-headedness   Complete by: As directed    Call MD for:  persistant nausea and vomiting   Complete by: As directed    Call MD for:  severe uncontrolled pain   Complete by: As directed    Call MD for:  temperature >100.4   Complete by: As directed    Diet - Byrd sodium heart healthy   Complete by: As directed    Increase activity slowly   Complete by: As directed      Allergies as of 01/09/2020   No Known Allergies     Medication List    STOP taking these medications   lisinopril 40 MG tablet Commonly known as: ZESTRIL     TAKE these medications   acetaminophen 325 MG tablet Commonly known as: TYLENOL Take 2 tablets (650 mg total) by mouth every 6 (six) hours as needed for mild pain (or Fever >/= 101).   amLODipine 5 MG tablet Commonly known as: NORVASC Take 5 mg by mouth daily.   aspirin 81 MG chewable tablet Chew 81 mg by mouth daily.   atorvastatin 80 MG tablet Commonly known as: LIPITOR Take 80 mg by mouth daily at 6 PM.   clopidogrel 75 MG tablet Commonly known as: PLAVIX Take 1 tablet (75 mg total) by mouth daily. Start taking on: January 10, 2020   cromolyn 4 % ophthalmic solution Commonly known as: OPTICROM Place 2 drops into the left eye 4 (four) times daily. 0600, 1200, 1800, 0000   feeding supplement (PRO-STAT SUGAR FREE 64) Liqd Take 30 mLs by mouth daily.   fluticasone 50 MCG/ACT nasal spray Commonly known as: FLONASE Place 2 sprays into both nostrils daily.   loratadine 10 MG tablet Commonly known as: CLARITIN Take 10 mg by mouth daily.   polyethylene glycol 17 g  packet Commonly known as: MIRALAX / GLYCOLAX Take 17 g by mouth daily. MIX IN 4-8 OZ OF FLUID   potassium chloride 20 MEQ packet Commonly known as: KLOR-CON Take 20 mEq by mouth 2 (two) times daily before a meal.   senna-docusate 8.6-50 MG tablet Commonly known as: Senokot-S Take 2 tablets by mouth 2 (two) times daily.   tamsulosin 0.4 MG Caps capsule Commonly known as: FLOMAX Take 1 capsule (0.4 mg total) by mouth daily after supper.            Durable Medical Equipment  (From admission, onward)         Start     Ordered   01/03/20 1022  For home use only DME 3 n 1  Once        01/03/20 1021   01/03/20 1020  For home use only DME Hospital bed  Once       Question Answer Comment  Length of Need 12 Months   Patient has (list medical condition): CVA with residual left sided weakness and dysarthria   The above medical condition requires: Patient requires the ability to reposition frequently   Head must be elevated greater than: 30 degrees   Bed  type Semi-electric   Support Surface: Gel Overlay      01/03/20 1020          No Known Allergies  Consultations:  Neurology  PM&R    Procedures/Studies: EEG  Result Date: 01/02/2020 Lora Havens, MD     01/02/2020 12:08 PM Patient Name: Alexander Byrd MRN: [id number] Epilepsy Attending: Lora Havens Referring Physician/Provider: Dr Donnetta Simpers Date: 01/02/2020 Duration: 25.27 mins Patient history: 68 y.o. 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 He was noted to have right eye deviation with twitching eye movements concerning for a possible seizure. EEG to evaluate for seizure Level of alertness: Awake, asleep AEDs during EEG study: LEV Technical aspects: This EEG study was done with scalp electrodes positioned according  to the 10-20 International system of electrode placement. Electrical activity was acquired at a sampling rate of $Remov'500Hz'ddPUpt$  and reviewed with a high frequency filter of $RemoveB'70Hz'BTScwdIA$  and a Byrd frequency filter of $RemoveB'1Hz'IdSAYgtj$ . EEG data were recorded continuously and digitally stored. Description: The posterior dominant rhythm consists of 8 Hz activity of moderate voltage (25-35 uV) seen predominantly in posterior head regions, symmetric and reactive to eye opening and eye closing. Sleep was characterized by vertex waves, sleep spindles (12 to 14 Hz), maximal frontocentral region.  EEG showed intermittent generalized 3 to 6 Hz theta-delta slowing. Hyperventilation and photic stimulation were not performed.   ABNORMALITY -Intermittent slow, generalized IMPRESSION: This study is suggestive of mild diffuse encephalopathy, nonspecific etiology. No seizures or epileptiform discharges were seen throughout the recording. Priyanka Barbra Sarks   CT ANGIO HEAD W OR WO CONTRAST  Result Date: 01/03/2020 CLINICAL DATA:  Initial evaluation for acute ataxia, follow-up exam for stroke. EXAM: CT ANGIOGRAPHY HEAD AND NECK TECHNIQUE: Multidetector CT imaging of the head and neck was performed using the standard protocol during bolus administration of intravenous contrast. Multiplanar CT image reconstructions and MIPs were obtained to evaluate the vascular anatomy. Carotid stenosis measurements (when applicable) are obtained utilizing NASCET criteria, using the distal internal carotid diameter as the denominator. CONTRAST:  71mL OMNIPAQUE IOHEXOL 350 MG/ML SOLN COMPARISON:  Prior brain MRI from 01/02/2020. FINDINGS: CT HEAD FINDINGS Brain: Age-related cerebral atrophy with moderate chronic microvascular ischemic disease. Remote lacunar infarcts noted at the left basal ganglia/corona radiata. Evolving cytotoxic edema involving the inferior left cerebellum consistent with previously identified left PICA territory infarcts. Additional previously described  ischemic change about the left middle cerebellar peduncle and medulla not well visualized by CT. No evidence for hemorrhagic transformation or significant regional mass effect. No other acute large vessel territory infarct. No mass lesion, midline shift or mass effect. No hydrocephalus or extra-axial fluid collection. Vascular: No hyperdense vessel. Scattered vascular calcifications noted within the carotid siphons. Skull: Scalp soft tissues and calvarium within normal limits. Sinuses: Paranasal sinuses and mastoid air cells are clear. Orbits: Globes and orbital soft tissues within normal limits. CTA NECK FINDINGS Aortic arch: Visualized aortic arch of normal caliber with normal 3 vessel morphology. No hemodynamically significant stenosis about the origin of the great vessels. Right carotid system: Right common and internal carotid arteries widely patent without stenosis, dissection or occlusion. Left carotid system: Left common and internal carotid arteries widely patent without stenosis, dissection or occlusion. Vertebral arteries: Both vertebral arteries arise from the subclavian arteries. No proximal subclavian artery stenosis. Vertebral arteries are diffusely hypoplastic  bilaterally but remain patent within the neck without stenosis, dissection or occlusion. Skeleton: No acute osseous finding. No discrete or worrisome osseous lesions. Moderate cervical spondylosis noted at C5-6 and C6-7. Poor dentition noted. Other neck: No other acute soft tissue abnormality within the neck. No mass or adenopathy. Upper chest: Visualized upper chest demonstrates no acute finding. Review of the MIP images confirms the above findings CTA HEAD FINDINGS Anterior circulation: Petrous and cavernous segments patent bilaterally. Atheromatous irregularity within the supraclinoid ICAs without hemodynamically significant stenosis. A1 segments patent bilaterally. Normal anterior communicating artery complex. Anterior cerebral arteries  patent to their distal aspects without high-grade stenosis. Left M1 segment widely patent. Short-segment moderate stenosis noted at the mid right M1 segment (series 15, image 64). Normal left MCA bifurcation. Right MCA trifurcations. Moderate atheromatous irregularity throughout the MCA branches distally. No proximal M2 branch occlusion. Posterior circulation: Right vertebral artery patent as it courses into the cranial vault. Right PICA origin patent and perfused. Right V4 segment essentially occludes just beyond the takeoff of the right PICA. Left vertebral artery essentially occludes at the cranial vault, with only scant thready flow seen distally. Left PICA not visualized, likely occluded. Basilar artery is diffusely diminutive with markedly irregular and scant thready flow. Perfusion of the basilar tip via collateralization from the anterior circulation. Superior cerebral arteries are perfused bilaterally. Predominant fetal type origin of the PCAs. PCAs are irregular but remain patent to their distal aspects without high-grade stenosis. Venous sinuses: Patent allowing for timing of the contrast bolus. Anatomic variants: Predominant fetal type origin of the PCAs with overall diminutive vertebrobasilar system. Review of the MIP images confirms the above findings IMPRESSION: CT HEAD IMPRESSION: 1. Continued interval evolution of scattered left PICA and posterior fossa infarcts, grossly stable from previous MRI. No evidence for hemorrhagic transformation or significant regional mass effect. 2. No other new acute intracranial abnormality. 3. Underlying atrophy with chronic small vessel ischemic disease. CTA HEAD AND NECK IMPRESSION: 1. 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 The left  PICA is not visualized, likely occluded. Both PCAs and SCAs remain perfused via the anterior circulation. 2. Short-segment moderate stenosis involving the mid right M1 segment. 3. Wide patency of the major arterial vasculature in the neck. Results were discussed by telephone at the time of interpretation on 01/03/2020 at 4:40 am with provider [facility]. Electronically Signed   By: Jeannine Boga M.D.   On: 01/03/2020 04:49   CT Head Wo Contrast  Result Date: 12/31/2019 CLINICAL DATA:  Seizure nontraumatic. Unresponsive. Rhythmic eye movements. EXAM: CT HEAD WITHOUT CONTRAST TECHNIQUE: Contiguous axial images were obtained from the base of the skull through the vertex without intravenous contrast. COMPARISON:  CT head 12/09/2019. FINDINGS: Brain: Patchy and confluent areas of decreased attenuation are noted throughout the deep and periventricular white matter of the cerebral hemispheres bilaterally, compatible with chronic microvascular ischemic disease. Redemonstration of a left lacunar infarction. No evidence of large-territorial acute infarction. No parenchymal hemorrhage. No mass lesion. No extra-axial collection. No mass effect or midline shift. No hydrocephalus. Basilar cisterns are patent. Vascular: No hyperdense vessel. Skull: No acute fracture or focal lesion. Sinuses/Orbits: Paranasal sinuses and mastoid air cells are clear. The orbits are unremarkable. Other: None. IMPRESSION: No acute intracranial abnormality. Electronically Signed   By: Clelia Croft.D.  On: 12/31/2019 21:50   CT ANGIO NECK W OR WO CONTRAST  Result Date: 01/03/2020 CLINICAL DATA:  Initial evaluation for acute ataxia, follow-up exam for stroke. EXAM: CT ANGIOGRAPHY HEAD AND NECK TECHNIQUE: Multidetector CT imaging of the head and neck was performed using the standard protocol during bolus administration of intravenous contrast. Multiplanar CT image reconstructions and MIPs were obtained to evaluate the vascular  anatomy. Carotid stenosis measurements (when applicable) are obtained utilizing NASCET criteria, using the distal internal carotid diameter as the denominator. CONTRAST:  81mL OMNIPAQUE IOHEXOL 350 MG/ML SOLN COMPARISON:  Prior brain MRI from 01/02/2020. FINDINGS: CT HEAD FINDINGS Brain: Age-related cerebral atrophy with moderate chronic microvascular ischemic disease. Remote lacunar infarcts noted at the left basal ganglia/corona radiata. Evolving cytotoxic edema involving the inferior left cerebellum consistent with previously identified left PICA territory infarcts. Additional previously described ischemic change about the left middle cerebellar peduncle and medulla not well visualized by CT. No evidence for hemorrhagic transformation or significant regional mass effect. No other acute large vessel territory infarct. No mass lesion, midline shift or mass effect. No hydrocephalus or extra-axial fluid collection. Vascular: No hyperdense vessel. Scattered vascular calcifications noted within the carotid siphons. Skull: Scalp soft tissues and calvarium within normal limits. Sinuses: Paranasal sinuses and mastoid air cells are clear. Orbits: Globes and orbital soft tissues within normal limits. CTA NECK FINDINGS Aortic arch: Visualized aortic arch of normal caliber with normal 3 vessel morphology. No hemodynamically significant stenosis about the origin of the great vessels. Right carotid system: Right common and internal carotid arteries widely patent without stenosis, dissection or occlusion. Left carotid system: Left common and internal carotid arteries widely patent without stenosis, dissection or occlusion. Vertebral arteries: Both vertebral arteries arise from the subclavian arteries. No proximal subclavian artery stenosis. Vertebral arteries are diffusely hypoplastic bilaterally but remain patent within the neck without stenosis, dissection or occlusion. Skeleton: No acute osseous finding. No discrete or  worrisome osseous lesions. Moderate cervical spondylosis noted at C5-6 and C6-7. Poor dentition noted. Other neck: No other acute soft tissue abnormality within the neck. No mass or adenopathy. Upper chest: Visualized upper chest demonstrates no acute finding. Review of the MIP images confirms the above findings CTA HEAD FINDINGS Anterior circulation: Petrous and cavernous segments patent bilaterally. Atheromatous irregularity within the supraclinoid ICAs without hemodynamically significant stenosis. A1 segments patent bilaterally. Normal anterior communicating artery complex. Anterior cerebral arteries patent to their distal aspects without high-grade stenosis. Left M1 segment widely patent. Short-segment moderate stenosis noted at the mid right M1 segment (series 15, image 64). Normal left MCA bifurcation. Right MCA trifurcations. Moderate atheromatous irregularity throughout the MCA branches distally. No proximal M2 branch occlusion. Posterior circulation: Right vertebral artery patent as it courses into the cranial vault. Right PICA origin patent and perfused. Right V4 segment essentially occludes just beyond the takeoff of the right PICA. Left vertebral artery essentially occludes at the cranial vault, with only scant thready flow seen distally. Left PICA not visualized, likely occluded. Basilar artery is diffusely diminutive with markedly irregular and scant thready flow. Perfusion of the basilar tip via collateralization from the anterior circulation. Superior cerebral arteries are perfused bilaterally. Predominant fetal type origin of the PCAs. PCAs are irregular but remain patent to their distal aspects without high-grade stenosis. Venous sinuses: Patent allowing for timing of the contrast bolus. Anatomic variants: Predominant fetal type origin of the PCAs with overall diminutive vertebrobasilar system. Review of the MIP images confirms the above findings IMPRESSION: CT HEAD IMPRESSION: 1. Continued  interval evolution of scattered left PICA and posterior fossa infarcts, grossly stable from previous MRI. No evidence for hemorrhagic transformation or significant regional mass effect. 2. No other new acute intracranial abnormality. 3. Underlying atrophy with chronic small vessel ischemic disease. CTA HEAD AND NECK IMPRESSION: 1. 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 The left PICA is not visualized, likely occluded. Both PCAs and SCAs remain perfused via the anterior circulation. 2. Short-segment moderate stenosis involving the mid right M1 segment. 3. Wide patency of the major arterial vasculature in the neck. Results were discussed by telephone at the time of interpretation on 01/03/2020 at 4:40 am with provider [facility]. Electronically Signed   By: Jeannine Boga M.D.   On: 01/03/2020 04:49   MR BRAIN WO CONTRAST  Result Date: 01/01/2020 CLINICAL DATA:  Initial evaluation for acute seizure. EXAM: MRI HEAD WITHOUT CONTRAST TECHNIQUE: Multiplanar, multiecho pulse sequences of the brain and surrounding structures were obtained without intravenous contrast. COMPARISON:  Prior head CT from 12/31/2019. FINDINGS: Brain: Examination moderately degraded by motion artifact. Diffuse prominence of the CSF containing spaces compatible generalized age-related cerebral atrophy. Six patchy and confluent T2/FLAIR hyperintensity within the periventricular deep white matter both cerebral hemispheres most consistent with chronic small vessel ischemic disease, moderate in nature. Few scatter remote lacunar infarcts present about the left basal ganglia/corona radiata. There is subtly increased diffusion signal abnormality involving the pons, with extension into the left middle  cerebellar peduncle (series 5, image 13). Additionally, there is subtly increased diffuse diffusion abnormality within the adjacent left cerebellar hemisphere (series 5, images 11, 9). Subtly increased associated FLAIR signal abnormality seen as well (series 13, images 8, 7, 6). No significant regional mass effect or edema. Adjacent fourth ventricle remains widely patent as do the basilar cisterns. No associated susceptibility artifact to suggest hemorrhage. Findings are nonspecific. No other abnormal foci of restricted diffusion to suggest acute or subacute ischemia. Gray-white matter differentiation otherwise maintained. No foci of susceptibility artifact to suggest acute or chronic intracranial hemorrhage. No mass lesion, mass effect, or midline shift. Ventricles normal size without hydrocephalus. No extra-axial fluid collection. Pituitary gland and suprasellar region within normal limits. Midline structures grossly intact. Vascular: Major intracranial vascular flow voids are grossly maintained at the skull base. Diminutive vertebrobasilar system noted, suggesting fetal type origin of the PCAs. Skull and upper cervical spine: Craniocervical junction grossly within normal limits. No focal marrow replacing lesion. No scalp soft tissue abnormality. Sinuses/Orbits: Globes and orbital soft tissues within normal limits. Paranasal sinuses are clear. No mastoid effusion. Inner ear structures grossly normal. Other: None. IMPRESSION: 1. Technically limited exam due to extensive motion artifact. 2. Subtly increased diffusion abnormality involving the pons, left middle cerebellar peduncle, and adjacent left cerebellar hemisphere as above. Findings are nonspecific, but could reflect changes related to acute seizure given provided history. Correlation with EEG suggested. Differential considerations include changes related to acute/subacute ischemia versus a nonspecific cerebritis/encephalitis. Correlation with LP and CSF  values also suggested as clinically warranted. Changes of PRES are considered, although felt to be less likely given the relative lack of additional changes elsewhere within the brain. 3. No other acute intracranial abnormality. 4. Age-related cerebral atrophy with moderate chronic small vessel ischemic disease, with a few scattered remote lacunar infarcts about the left basal ganglia/corona radiata. Electronically Signed  By: Jeannine Boga M.D.   On: 01/01/2020 01:56   MR BRAIN W WO CONTRAST  Addendum Date: 01/03/2020   ADDENDUM REPORT: 01/03/2020 06:12 ADDENDUM: Results communicated by telephone to nurse practitioner Rufina Falco at 1:20 a.m. on 01/02/2020. Electronically Signed   By: Jeannine Boga M.D.   On: 01/03/2020 06:12   Result Date: 01/03/2020 CLINICAL DATA:  Initial evaluation for bacterial meningitis. Evaluate for resolution of previously seen subtle diffusion abnormality from prior exam. EXAM: MRI HEAD WITHOUT AND WITH CONTRAST TECHNIQUE: Multiplanar, multiecho pulse sequences of the brain and surrounding structures were obtained without and with intravenous contrast. CONTRAST:  15mL GADAVIST GADOBUTROL 1 MMOL/ML IV SOLN COMPARISON:  Prior MRI from 01/01/2020. FINDINGS: Brain: Examination mildly degraded by motion artifact, particularly the postcontrast sequences. Generalized age-related cerebral atrophy. Patchy and confluent T2/FLAIR hyperintensity within the periventricular and deep white matter both cerebral hemispheres most consistent with chronic small vessel ischemic disease, moderate in nature. Few small remote lacunar infarcts noted at the left basal ganglia/corona radiata. Previously identified diffusion abnormality involving the left cerebellum again seen, markedly more intense on today's exam as compared to previous (series 6, image 11). Scattered patchy involvement of the left middle cerebellar peduncle and ventral medulla (series 6, images 15, 13). Associated mildly  decreased signal on corresponding ADC map, with prominent T2/FLAIR hyperintensity within the areas affected. Following contrast administration, there is patchy and somewhat linear postcontrast enhancement throughout the areas involved (series 22, images 8, 9). Given appearance and distribution, findings felt to be most consistent with acute to subacute ischemic infarcts, predominantly left PICA territory. Suspected faint petechial hemorrhage at the inferior left cerebellum without hemorrhagic transformation (series 13, image 14). No other new diffusion abnormality to suggest acute or subacute ischemia seen elsewhere within the brain. Gray-white matter differentiation otherwise maintained. No other areas of chronic cortical infarction. No other foci of susceptibility artifact to suggest acute or chronic intracranial hemorrhage. No mass lesion, midline shift or mass effect. No hydrocephalus or extra-axial fluid collection. Pituitary gland and suprasellar region within normal limits. Midline structures intact. No other abnormal enhancement to suggest meningitis or other intracranial infection. No intrinsic temporal lobe abnormality. Vascular: Intracranial flow voids within the bilateral V4 segments and basilar artery are not well seen, and could be partially occluded given the posterior circulation infarcts (series 10, images 5, 8). There is suspected predominant fetal type origin of the PCAs. Major intracranial vascular flow voids are otherwise maintained. Skull and upper cervical spine: Craniocervical junction within normal limits. Bone marrow signal intensity normal. No scalp soft tissue abnormality. Sinuses/Orbits: Globes and orbital soft tissues within normal limits. Mild mucosal thickening noted within the ethmoidal air cells and maxillary sinuses. Paranasal sinuses are otherwise clear. No mastoid effusion. Inner ear structures grossly normal. Other: None. IMPRESSION: 1. Persistent and increased prominence of  previously identified left cerebellar diffusion abnormality, with additional patchy involvement of the left middle cerebellar peduncle and medulla. Given appearance and distribution on today's exam, findings felt to be most consistent with acute to subacute ischemic infarcts, predominantly left PICA territory. Suspected faint petechial hemorrhage at the inferior left cerebellum without hemorrhagic transformation. 2. Poorly visualized and potentially absent flow voids within the distal V4 segments and basilar artery, which could be occluded given the posterior circulation infarcts. Correlation with dedicated CTA suggested. 3. No other acute intracranial abnormality. No other abnormal enhancement to suggest meningitis or other intracranial infection. No other findings of acute seizure. 4. Underlying age-related cerebral atrophy with moderate chronic microvascular ischemic  disease. Current attempt is being made to contact the covering physician regarding these findings, results will be communicated as soon as possible. Electronically Signed: By: Jeannine Boga M.D. On: 01/03/2020 00:41   CT ABDOMEN PELVIS W CONTRAST  Result Date: 12/31/2019 CLINICAL DATA:  Abdominal distension. EXAM: CT ABDOMEN AND PELVIS WITH CONTRAST TECHNIQUE: Multidetector CT imaging of the abdomen and pelvis was performed using the standard protocol following bolus administration of intravenous contrast. CONTRAST:  11mL OMNIPAQUE IOHEXOL 300 MG/ML  SOLN COMPARISON:  None. FINDINGS: Lower chest: Linear atelectasis versus scarring within the right lower lobe. Otherwise no acute abnormality. Hepatobiliary: There is a 2.6 cm fluid density lesion within the right hepatic lobe that likely represents a simple hepatic cyst (2:12). There is a subjacent subcentimeter hypodensity that is too small to characterize. Pancreas: No focal lesion. Normal pancreatic contour. No surrounding inflammatory changes. No main pancreatic ductal dilatation.  Spleen: Normal in size without focal abnormality. Adrenals/Urinary Tract: No adrenal nodule bilaterally. Bilateral kidneys enhance symmetrically. No hydronephrosis. No hydroureter. The urinary bladder is unremarkable. Stomach/Bowel: Stomach is within normal limits. Several loops of distal small bowel demonstrates circumferential bowel wall thickening as well as submucosal hyperenhancement. No small bowel dilatation. No pneumatosis. No evidence of large bowel wall thickening or dilatation. Appendix appears normal. Vascular/Lymphatic: No abdominal aorta or iliac aneurysm. No abdominal, pelvic, or inguinal lymphadenopathy. Reproductive: The prostate gland is enlarged measuring up to 5.3 cm. Other: No intraperitoneal free fluid. No intraperitoneal free gas. No organized fluid collection. Musculoskeletal: No abdominal wall hernia or abnormality No suspicious lytic or blastic osseous lesions. Old healed bilateral anterior rib fractures. No acute displaced fracture. Multilevel degenerative changes of the spine with intervertebral disc space vacuum phenomenon endplate sclerosis at the L5-S1 level. IMPRESSION: 1. Findings suggestive of enteritis. 2. Prostatomegaly.  Consider correlating with PSA levels. Electronically Signed   By: Iven Finn M.D.   On: 12/31/2019 22:01   DG Chest Port 1 View  Result Date: 12/31/2019 CLINICAL DATA:  Unresponsive EXAM: PORTABLE CHEST 1 VIEW COMPARISON:  December 09, 2019 FINDINGS: The heart size and mediastinal contours are within normal limits. Again noted is slight elevation of the right hemidiaphragm. Subsegmental atelectasis seen at the right lung base. The visualized skeletal structures are unremarkable. IMPRESSION: Subsegmental atelectasis at the right lung base. Electronically Signed   By: Prudencio Pair M.D.   On: 12/31/2019 21:06   ECHOCARDIOGRAM COMPLETE  Result Date: 01/04/2020    ECHOCARDIOGRAM REPORT   Patient Name:   Alexander Byrd Date of Exam: 01/04/2020 Medical  Rec #:  [id number]        Height:       72.0 in Accession #:    [id number]       Weight:       160.3 lb Date of Birth:  1950/08/31       BSA:          1.939 m Patient Age:    26 years         BP:           133/85 mmHg Patient Gender: M                HR:           96 bpm. Exam Location:  [facility] Procedure: 2D Echo, Color Doppler, Cardiac Doppler and Intracardiac            Opacification Agent Indications:     I163.9 Stroke  History:  Patient has no prior history of Echocardiogram examinations.                  S/P stroke 3 wks prior; Arrythmias:Tachycardia.  Sonographer:     Charmayne Sheer RDCS (AE) Referring Phys:  [id number] Lang Snow Diagnosing Phys: Ida Rogue MD  Sonographer Comments: Technically difficult study due to poor echo windows. Image acquisition challenging due to respiratory motion. IMPRESSIONS  1. Left ventricular ejection fraction, by estimation, is 60 to 65%. The left ventricle has normal function. The left ventricle has no regional wall motion abnormalities. Left ventricular diastolic parameters are consistent with Grade I diastolic dysfunction (impaired relaxation).  2. Right ventricular systolic function is normal. The right ventricular size is normal. FINDINGS  Left Ventricle: Left ventricular ejection fraction, by estimation, is 60 to 65%. The left ventricle has normal function. The left ventricle has no regional wall motion abnormalities. Definity contrast agent was given IV to delineate the left ventricular  endocardial borders. The left ventricular internal cavity size was normal in size. There is no left ventricular hypertrophy. Left ventricular diastolic parameters are consistent with Grade I diastolic dysfunction (impaired relaxation). Right Ventricle: The right ventricular size is normal. No increase in right ventricular wall thickness. Right ventricular systolic function is normal. Left Atrium: Left atrial size was normal in size. Right Atrium: Right atrial size was  normal in size. Pericardium: There is no evidence of pericardial effusion. Mitral Valve: The mitral valve is normal in structure. No evidence of mitral valve regurgitation. No evidence of mitral valve stenosis. MV peak gradient, 4.5 mmHg. The mean mitral valve gradient is 2.0 mmHg. Tricuspid Valve: The tricuspid valve is normal in structure. Tricuspid valve regurgitation is not demonstrated. No evidence of tricuspid stenosis. Aortic Valve: The aortic valve was not assessed. Aortic valve regurgitation is not visualized. No aortic stenosis is present. Aortic valve mean gradient measures 3.0 mmHg. Aortic valve peak gradient measures 4.7 mmHg. Aortic valve area, by VTI measures 3.08 cm. Pulmonic Valve: The pulmonic valve was normal in structure. Pulmonic valve regurgitation is not visualized. No evidence of pulmonic stenosis. Aorta: The aortic root is normal in size and structure. Venous: The inferior vena cava is normal in size with greater than 50% respiratory variability, suggesting right atrial pressure of 3 mmHg. IAS/Shunts: No atrial level shunt detected by color flow Doppler.  LEFT VENTRICLE PLAX 2D LVIDd:         4.03 cm  Diastology LVIDs:         3.48 cm  LV e' medial:    7.83 cm/s LV PW:         1.07 cm  LV E/e' medial:  8.1 LV IVS:        0.92 cm  LV e' lateral:   10.20 cm/s LVOT diam:     2.10 cm  LV E/e' lateral: 6.2 LV SV:         59 LV SV Index:   30 LVOT Area:     3.46 cm  LEFT ATRIUM             Index LA diam:        3.80 cm 1.96 cm/m LA Vol (A2C):   37.7 ml 19.44 ml/m LA Vol (A4C):   34.9 ml 18.00 ml/m LA Biplane Vol: 37.7 ml 19.44 ml/m  AORTIC VALVE AV Area (Vmax):    3.24 cm AV Area (Vmean):   3.35 cm AV Area (VTI):     3.08 cm AV Vmax:  108.00 cm/s AV Vmean:          79.100 cm/s AV VTI:            0.191 m AV Peak Grad:      4.7 mmHg AV Mean Grad:      3.0 mmHg LVOT Vmax:         101.00 cm/s LVOT Vmean:        76.500 cm/s LVOT VTI:          0.170 m LVOT/AV VTI ratio: 0.89  AORTA Ao  Root diam: 3.20 cm MITRAL VALVE MV Area (PHT): 8.92 cm    SHUNTS MV Peak grad:  4.5 mmHg    Systemic VTI:  0.17 m MV Mean grad:  2.0 mmHg    Systemic Diam: 2.10 cm MV Vmax:       1.06 m/s MV Vmean:      62.2 cm/s MV Decel Time: 85 msec MV E velocity: 63.40 cm/s MV A velocity: 93.00 cm/s MV E/A ratio:  0.68 Ida Rogue MD Electronically signed by Ida Rogue MD Signature Date/Time: 01/04/2020/1:03:51 PM    Final         Subjective: Pt resting in bed awake when seen.  Denies pain, N/v/d, SOB, CP or other acute complaints.  Poor historian, does not verbalize much besides yes/no answers.     Discharge Exam: Vitals:   01/09/20 0355 01/09/20 0826  BP: 130/83 (!) 147/91  Pulse: 89 88  Resp: 18   Temp: 98.4 F (36.9 C) 99 F (37.2 C)  SpO2: 97% 96%   Vitals:   01/08/20 1455 01/08/20 1932 01/09/20 0355 01/09/20 0826  BP: (!) 156/90 132/86 130/83 (!) 147/91  Pulse: 94 88 89 88  Resp: $Remo'18 18 18   'uPUKV$ Temp: 98.7 F (37.1 C) 98.5 F (36.9 C) 98.4 F (36.9 C) 99 F (37.2 C)  TempSrc: Oral Oral Oral Oral  SpO2: 95% 96% 97% 96%  Weight:   70.1 kg   Height:        General: Pt is alert, awake, not in acute distress Cardiovascular: RRR, S1/S2 +, no rubs, no gallops Respiratory: CTA bilaterally, no wheezing, no rhonchi Abdominal: Soft, NT, ND, bowel sounds + Extremities: no edema, no cyanosis    The results of significant diagnostics from this hospitalization (including imaging, microbiology, ancillary and laboratory) are listed below for reference.     Microbiology: Recent Results (from the past 240 hour(s))  Resp Panel by RT-PCR (Flu A&B, Covid) Nasopharyngeal Swab     Status: None   Collection Time: 12/31/19  8:48 PM   Specimen: Nasopharyngeal Swab; Nasopharyngeal(NP) swabs in vial transport medium  Result Value Ref Range Status   SARS Coronavirus 2 by RT PCR NEGATIVE NEGATIVE Final    Comment: (NOTE) SARS-CoV-2 target nucleic acids are NOT DETECTED.  The SARS-CoV-2 RNA is  generally detectable in upper respiratory specimens during the acute phase of infection. The lowest concentration of SARS-CoV-2 viral copies this assay can detect is 138 copies/mL. A negative result does not preclude SARS-Cov-2 infection and should not be used as the sole basis for treatment or other patient management decisions. A negative result may occur with  improper specimen collection/handling, submission of specimen other than nasopharyngeal swab, presence of viral mutation(s) within the areas targeted by this assay, and inadequate number of viral copies(<138 copies/mL). A negative result must be combined with clinical observations, patient history, and epidemiological information. The expected result is Negative.  Fact Sheet for Patients:  EntrepreneurPulse.com.au  Fact  Sheet for Healthcare Providers:  IncredibleEmployment.be  This test is no t yet approved or cleared by the [place] FDA and  has been authorized for detection and/or diagnosis of SARS-CoV-2 by FDA under an Emergency Use Authorization (EUA). This EUA will remain  in effect (meaning this test can be used) for the duration of the COVID-19 declaration under Section 564(b)(1) of the Act, 21 U.S.C.section 360bbb-3(b)(1), unless the authorization is terminated  or revoked sooner.       Influenza A by PCR NEGATIVE NEGATIVE Final   Influenza B by PCR NEGATIVE NEGATIVE Final    Comment: (NOTE) The Xpert Xpress SARS-CoV-2/FLU/RSV plus assay is intended as an aid in the diagnosis of influenza from Nasopharyngeal swab specimens and should not be used as a sole basis for treatment. Nasal washings and aspirates are unacceptable for Xpert Xpress SARS-CoV-2/FLU/RSV testing.  Fact Sheet for Patients: EntrepreneurPulse.com.au  Fact Sheet for Healthcare Providers: IncredibleEmployment.be  This test is not yet approved or cleared by the [place] FDA and has been authorized for detection and/or diagnosis of SARS-CoV-2 by FDA under an Emergency Use Authorization (EUA). This EUA will remain in effect (meaning this test can be used) for the duration of the COVID-19 declaration under Section 564(b)(1) of the Act, 21 U.S.C. section 360bbb-3(b)(1), unless the authorization is terminated or revoked.  Performed at [facility], [address], [place], [place] [address]   Urine Culture     Status: None   Collection Time: 12/31/19  9:15 PM   Specimen: Urine, Random  Result Value Ref Range Status   Specimen Description   Final    URINE, RANDOM Performed at [facility], [address] [place], [place] [address]    Special Requests   Final    NONE Performed at [facility], [address] [place], [place] [address]    Culture   Final    NO GROWTH Performed at [facility], [place] [address] [place], [place] [address]    Report Status 01/02/2020 FINAL  Final  C Difficile Quick Screen w PCR reflex     Status: None   Collection Time: 01/01/20  6:09 AM   Specimen: STOOL  Result Value Ref Range Status   C Diff antigen NEGATIVE NEGATIVE Final   C Diff toxin NEGATIVE NEGATIVE Final   C Diff interpretation No C. difficile detected.  Final    Comment: Performed at [facility], [address] [place], [place] [address]  Gastrointestinal Panel by PCR , Stool     Status: Abnormal   Collection Time: 01/01/20  6:09 AM   Specimen: STOOL  Result Value Ref Range Status   Campylobacter species NOT DETECTED NOT DETECTED Final   Plesimonas shigelloides NOT DETECTED NOT DETECTED Final   Salmonella species NOT DETECTED NOT DETECTED Final   Yersinia enterocolitica NOT DETECTED NOT DETECTED Final   Vibrio species NOT DETECTED NOT DETECTED Final   Vibrio cholerae NOT DETECTED NOT DETECTED Final   Enteroaggregative E coli (EAEC) NOT DETECTED NOT DETECTED Final   Enteropathogenic E coli (EPEC)  DETECTED (A) NOT DETECTED Final    Comment: RESULT CALLED TO, READ BACK BY AND VERIFIED WITH: ISABELLA LAPIETRA ON 01/01/20 AT 0751 QSD    Enterotoxigenic E coli (ETEC) NOT DETECTED NOT DETECTED Final   Shiga like toxin producing E coli (STEC) NOT DETECTED NOT DETECTED Final   Shigella/Enteroinvasive E coli (EIEC) NOT DETECTED NOT DETECTED Final   Cryptosporidium NOT DETECTED NOT DETECTED  Final   Cyclospora cayetanensis NOT DETECTED NOT DETECTED Final   Entamoeba histolytica NOT DETECTED NOT DETECTED Final   Giardia lamblia NOT DETECTED NOT DETECTED Final   Adenovirus F40/41 NOT DETECTED NOT DETECTED Final   Astrovirus NOT DETECTED NOT DETECTED Final   Norovirus GI/GII NOT DETECTED NOT DETECTED Final   Rotavirus A NOT DETECTED NOT DETECTED Final   Sapovirus (I, II, IV, and V) NOT DETECTED NOT DETECTED Final    Comment: Performed at [facility], [address] [place], [place] [address]  Blood culture (routine x 2)     Status: None   Collection Time: 01/01/20 11:47 AM   Specimen: BLOOD  Result Value Ref Range Status   Specimen Description BLOOD LEFT ARM  Final   Special Requests   Final    BOTTLES DRAWN AEROBIC AND ANAEROBIC Blood Culture results may not be optimal due to an excessive volume of blood received in culture bottles   Culture   Final    NO GROWTH 5 DAYS Performed at [facility], [address] [place], [place] [address]    Report Status 01/06/2020 FINAL  Final  Blood culture (routine x 2)     Status: None   Collection Time: 01/01/20 12:36 PM   Specimen: BLOOD  Result Value Ref Range Status   Specimen Description BLOOD BLOOD RIGHT HAND  Final   Special Requests   Final    BOTTLES DRAWN AEROBIC ONLY Blood Culture results may not be optimal due to an inadequate volume of blood received in culture bottles   Culture   Final    NO GROWTH 5 DAYS Performed at [facility], [address] [place], [place] [address]    Report Status  01/06/2020 FINAL  Final  MRSA PCR Screening     Status: None   Collection Time: 01/03/20  1:33 PM   Specimen: Nasal Mucosa; Nasopharyngeal  Result Value Ref Range Status   MRSA by PCR NEGATIVE NEGATIVE Final    Comment:        The GeneXpert MRSA Assay (FDA approved for NASAL specimens only), is one component of a comprehensive MRSA colonization surveillance program. It is not intended to diagnose MRSA infection nor to guide or monitor treatment for MRSA infections. Performed at [facility], [address] [place], [place] [address]      Labs: BNP (last 3 results) No results for input(s): BNP in the last 8760 hours. Basic Metabolic Panel: Recent Labs  Lab 01/05/20 0555 01/06/20 0456 01/07/20 0548 01/08/20 0437 01/09/20 0340  NA 135 136 139 141 140  K 3.4* 3.2* 3.3* 3.4* 3.9  CL 106 106 107 111 109  CO2 17* $Remov'22 24 22 24  'XSOVOH$ GLUCOSE 74 111* 119* 116* 115*  BUN $Re'11 10 16 19 14  'DCo$ CREATININE 0.90 0.98 1.15 1.06 1.07  CALCIUM 8.9 8.9 9.1 9.0 8.9  MG  --  1.6* 1.9 1.8 1.9   Liver Function Tests: No results for input(s): AST, ALT, ALKPHOS, BILITOT, PROT, ALBUMIN in the last 168 hours. No results for input(s): LIPASE, AMYLASE in the last 168 hours. No results for input(s): AMMONIA in the last 168 hours. CBC: No results for input(s): WBC, NEUTROABS, HGB, HCT, MCV, PLT in the last 168 hours. Cardiac Enzymes: No results for input(s): CKTOTAL, CKMB, CKMBINDEX, TROPONINI in the last 168 hours. BNP: Invalid input(s): POCBNP CBG: Recent Labs  Lab 01/03/20 1127 01/03/20 2006  GLUCAP 87 81   D-Dimer No results for input(s): DDIMER  in the last 72 hours. Hgb A1c No results for input(s): HGBA1C in the last 72 hours. Lipid Profile No results for input(s): CHOL, HDL, LDLCALC, TRIG, CHOLHDL, LDLDIRECT in the last 72 hours. Thyroid function studies No results for input(s): TSH, T4TOTAL, T3FREE, THYROIDAB in the last 72 hours.  Invalid input(s): FREET3 Anemia work  up No results for input(s): VITAMINB12, FOLATE, FERRITIN, TIBC, IRON, RETICCTPCT in the last 72 hours. Urinalysis    Component Value Date/Time   COLORURINE YELLOW (A) 12/31/2019 2015   APPEARANCEUR HAZY (A) 12/31/2019 2015   LABSPEC 1.040 (H) 12/31/2019 2015   PHURINE 5.0 12/31/2019 2015   GLUCOSEU 50 (A) 12/31/2019 2015   HGBUR SMALL (A) 12/31/2019 2015   BILIRUBINUR NEGATIVE 12/31/2019 2015   KETONESUR 20 (A) 12/31/2019 2015   PROTEINUR NEGATIVE 12/31/2019 2015   NITRITE NEGATIVE 12/31/2019 2015   LEUKOCYTESUR NEGATIVE 12/31/2019 2015   Sepsis Labs Invalid input(s): PROCALCITONIN,  WBC,  LACTICIDVEN Microbiology Recent Results (from the past 240 hour(s))  Resp Panel by RT-PCR (Flu A&B, Covid) Nasopharyngeal Swab     Status: None   Collection Time: 12/31/19  8:48 PM   Specimen: Nasopharyngeal Swab; Nasopharyngeal(NP) swabs in vial transport medium  Result Value Ref Range Status   SARS Coronavirus 2 by RT PCR NEGATIVE NEGATIVE Final    Comment: (NOTE) SARS-CoV-2 target nucleic acids are NOT DETECTED.  The SARS-CoV-2 RNA is generally detectable in upper respiratory specimens during the acute phase of infection. The lowest concentration of SARS-CoV-2 viral copies this assay can detect is 138 copies/mL. A negative result does not preclude SARS-Cov-2 infection and should not be used as the sole basis for treatment or other patient management decisions. A negative result may occur with  improper specimen collection/handling, submission of specimen other than nasopharyngeal swab, presence of viral mutation(s) within the areas targeted by this assay, and inadequate number of viral copies(<138 copies/mL). A negative result must be combined with clinical observations, patient history, and epidemiological information. The expected result is Negative.  Fact Sheet for Patients:  EntrepreneurPulse.com.au  Fact Sheet for Healthcare Providers:   IncredibleEmployment.be  This test is no t yet approved or cleared by the [place] FDA and  has been authorized for detection and/or diagnosis of SARS-CoV-2 by FDA under an Emergency Use Authorization (EUA). This EUA will remain  in effect (meaning this test can be used) for the duration of the COVID-19 declaration under Section 564(b)(1) of the Act, 21 U.S.C.section 360bbb-3(b)(1), unless the authorization is terminated  or revoked sooner.       Influenza A by PCR NEGATIVE NEGATIVE Final   Influenza B by PCR NEGATIVE NEGATIVE Final    Comment: (NOTE) The Xpert Xpress SARS-CoV-2/FLU/RSV plus assay is intended as an aid in the diagnosis of influenza from Nasopharyngeal swab specimens and should not be used as a sole basis for treatment. Nasal washings and aspirates are unacceptable for Xpert Xpress SARS-CoV-2/FLU/RSV testing.  Fact Sheet for Patients: EntrepreneurPulse.com.au  Fact Sheet for Healthcare Providers: IncredibleEmployment.be  This test is not yet approved or cleared by the [place] FDA and has been authorized for detection and/or diagnosis of SARS-CoV-2 by FDA under an Emergency Use Authorization (EUA). This EUA will remain in effect (meaning this test can be used) for the duration of the COVID-19 declaration under Section 564(b)(1) of the Act, 21 U.S.C. section 360bbb-3(b)(1), unless the authorization is terminated or revoked.  Performed at [facility], [address] [place], [place] [address]   Urine Culture  Status: None   Collection Time: 12/31/19  9:15 PM   Specimen: Urine, Random  Result Value Ref Range Status   Specimen Description   Final    URINE, RANDOM Performed at [facility], [address] [place], [place] [address]    Special Requests   Final    NONE Performed at [facility], [address], [place], [place] [address]    Culture   Final     NO GROWTH Performed at [facility], [place] [address] [place], [place] [address]    Report Status 01/02/2020 FINAL  Final  C Difficile Quick Screen w PCR reflex     Status: None   Collection Time: 01/01/20  6:09 AM   Specimen: STOOL  Result Value Ref Range Status   C Diff antigen NEGATIVE NEGATIVE Final   C Diff toxin NEGATIVE NEGATIVE Final   C Diff interpretation No C. difficile detected.  Final    Comment: Performed at [facility], [address] [place], [place] [address]  Gastrointestinal Panel by PCR , Stool     Status: Abnormal   Collection Time: 01/01/20  6:09 AM   Specimen: STOOL  Result Value Ref Range Status   Campylobacter species NOT DETECTED NOT DETECTED Final   Plesimonas shigelloides NOT DETECTED NOT DETECTED Final   Salmonella species NOT DETECTED NOT DETECTED Final   Yersinia enterocolitica NOT DETECTED NOT DETECTED Final   Vibrio species NOT DETECTED NOT DETECTED Final   Vibrio cholerae NOT DETECTED NOT DETECTED Final   Enteroaggregative E coli (EAEC) NOT DETECTED NOT DETECTED Final   Enteropathogenic E coli (EPEC) DETECTED (A) NOT DETECTED Final    Comment: RESULT CALLED TO, READ BACK BY AND VERIFIED WITH: ISABELLA LAPIETRA ON 01/01/20 AT 0751 QSD    Enterotoxigenic E coli (ETEC) NOT DETECTED NOT DETECTED Final   Shiga like toxin producing E coli (STEC) NOT DETECTED NOT DETECTED Final   Shigella/Enteroinvasive E coli (EIEC) NOT DETECTED NOT DETECTED Final   Cryptosporidium NOT DETECTED NOT DETECTED Final   Cyclospora cayetanensis NOT DETECTED NOT DETECTED Final   Entamoeba histolytica NOT DETECTED NOT DETECTED Final   Giardia lamblia NOT DETECTED NOT DETECTED Final   Adenovirus F40/41 NOT DETECTED NOT DETECTED Final   Astrovirus NOT DETECTED NOT DETECTED Final   Norovirus GI/GII NOT DETECTED NOT DETECTED Final   Rotavirus A NOT DETECTED NOT DETECTED Final   Sapovirus (I, II, IV, and V) NOT DETECTED NOT DETECTED Final    Comment: Performed at  [facility], [address] [place], [place] [address]  Blood culture (routine x 2)     Status: None   Collection Time: 01/01/20 11:47 AM   Specimen: BLOOD  Result Value Ref Range Status   Specimen Description BLOOD LEFT ARM  Final   Special Requests   Final    BOTTLES DRAWN AEROBIC AND ANAEROBIC Blood Culture results may not be optimal due to an excessive volume of blood received in culture bottles   Culture   Final    NO GROWTH 5 DAYS Performed at [facility], [address] [place], [place] [address]    Report Status 01/06/2020 FINAL  Final  Blood culture (routine x 2)     Status: None   Collection Time: 01/01/20 12:36 PM   Specimen: BLOOD  Result Value Ref Range Status   Specimen Description BLOOD BLOOD RIGHT HAND  Final   Special Requests   Final    BOTTLES DRAWN AEROBIC  ONLY Blood Culture results may not be optimal due to an inadequate volume of blood received in culture bottles   Culture   Final    NO GROWTH 5 DAYS Performed at [facility], [address] [place], [place] [address]    Report Status 01/06/2020 FINAL  Final  MRSA PCR Screening     Status: None   Collection Time: 01/03/20  1:33 PM   Specimen: Nasal Mucosa; Nasopharyngeal  Result Value Ref Range Status   MRSA by PCR NEGATIVE NEGATIVE Final    Comment:        The GeneXpert MRSA Assay (FDA approved for NASAL specimens only), is one component of a comprehensive MRSA colonization surveillance program. It is not intended to diagnose MRSA infection nor to guide or monitor treatment for MRSA infections. Performed at [facility], [address] [place], [place] [address]      Time coordinating discharge: Over 30 minutes  SIGNED:   Ezekiel Slocumb, DO [facility] 01/09/2020, 10:20 AM   If 7PM-7AM, please contact night-coverage [email or web address]

## 2020-01-09 NOTE — Evaluation (Addendum)
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 No pertinent past medical history. Past Surgical History: History reviewed. No pertinent surgical history.  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  History taken from chart review due to cognition.  He was initially admitted to [facility] after his most recent CVA and was discharged to skilled nursing facility where he developed abdominal pain nausea and vomiting.  Patient with prior CVA and lives with spouse.  1 level home ramped entrance.  Was independent working part-time at a [employer], manufacturing systems and driving.  He [facility] to [facility]31/[facility].  EEG negative for seizures.  CT/MRI showed subtly increased diffusion abnormality involving the left pons middle cerebellar peduncle and adjacent left cerebellar hemisphere.  No other acute abnormality.  There was a few scattered remote lacunar infarcts about the left basal ganglia and corona radiata.  CT of abdomen pelvis showed findings suggestive of enteritis.  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  Short segment  moderate stenosis involving the mid right M1 segment.  Echocardiogram with EF of 60-65%, no wall motion abnormalities grade 1 diastolic dysfunction.  Follow-up MRI completed findings consistent with left PICA infarction advised continue aspirin and Plavix.  Admission chemistries unremarkable aside glucose 169, creatinine 1.48, lipase 33, urinalysis negative nitrite with urine culture no growth, lactic acid 4.4, C. difficile negative, blood cultures no growth to date.  Hospital course work-up for sepsis related to acute enteritis he was placed on azithromycin x3 days as well as IV gentle hydration.  Latest creatinine improved 1.07 after gentle hydration.  He is currently on a dysphagia #1 honey thick liquid diet.  Subcutaneous Lovenox for DVT prophylaxis. Pt admitted to [facility] 01/09/20 and SLP evaluations were completed 01/10/20 with results as follows:  Bedside swallow evaluation: Pt presents with s/sx oropharyngeal dysphagia. Today he required encouragement for self feeding, but consumed items from puree breakfast tray with efficient transit and oral clearance. Dysphagia 3 texture trials resulted in prolonged mastication and trace lingual residue, making it largely inefficient at this time. However, mastication, oral transit, and clearance of Dys 2 solids was very functional. Pt self fed cup sips of honey thick liquids (current recommendation) without any overt s/sx aspiration and good use of swallow precautions. Delayed throat clear and immediate cough noted during trials of thin H2O. When taking medications crushed with RN, pt noted to hold in his mouth briefly, however believe this was due to bitter taste, as it was not observed with any other texture.  Would recommend pt upgrade to Dysphagia 2 textures, pills may be give 1 at a time whole in applesauce, continue with honey thick liquids - pt may self-feed cup sips. He should have full supervision to  ensure greatest safety and use of  swallow precautions during intake. ST will continue to work toward advancement with pt and anticipate he will need MBSS early next week to instrumentally assess oropharyngeal swallow function prior to liquid advancement.   Cognitive-linguistic evaluation: Pt exhibited mild dysarthria, characterized by articulatory imprecision with consonant clusters and plosives. However, his speech was ~85% intelligible at the word and phrase level throughout session. He did require cues for verbal initiation, as his overall verbal output was limited without prompting and cues from clinician to expand. He was oriented X4, however decreased sustained attention and short term memory noted during functional tasks and targeted conversations. He did demonstrate some intellectual awareness of cognitive and physical deficits. Confrontation naming was 100% accurate, responsive 95% accurate, divergent naming tasks required extra time and cues for recall throughout. He followed 1 and 2 step (basic) directions with 2/3 accuracy. Auditory processing appeared adequate, however delayed/slowed.   Recommend pt receive skilled ST services to address dysphagia, dysarthria, communication of basic wants and needs, and cognitive function in order to maximize his safety and functional independence prior to d/c.     Skilled Therapeutic Interventions          Bedside swallow and cognitive-linguistic evaluations were administered and results were reviewed with pt (please see above for details regarding the results).    SLP Assessment  Patient will need skilled Speech Lanaguage Pathology Services during CIR admission    Recommendations  Liquid Administration via: Cup;No straw Medication Administration: Crushed with puree Supervision: Full supervision/cueing for compensatory strategies;Patient able to self feed Compensations: Minimize environmental distractions Postural Changes and/or Swallow Maneuvers: Seated upright 90 degrees;Upright 30-60  min after meal Oral Care Recommendations: Oral care BID Patient destination: Home Follow up Recommendations: Home Health SLP;24 hour supervision/assistance Equipment Recommended: None recommended by SLP    SLP Frequency 3 to 5 out of 7 days   SLP Duration  SLP Intensity  SLP Treatment/Interventions 3 weeks  Minumum of 1-2 x/day, 30 to 90 minutes  Cognitive remediation/compensation;Cueing hierarchy;Patient/family education;Therapeutic Activities;Functional tasks;Dysphagia/aspiration precaution training;Internal/external aids;Speech/Language facilitation    Pain Pain Assessment Pain Scale: Faces Pain Score: 0-No pain Faces Pain Scale: No hurt  Prior Functioning Type of Home: Mobile home  Lives With: Significant other Available Help at Discharge: Family;Available 24 hours/day (sister to provide 24/7 supervision)  SLP Evaluation Cognition Overall Cognitive Status: Impaired/Different from baseline Arousal/Alertness: Awake/alert Orientation Level: Oriented X4 Attention: Sustained Sustained Attention: Impaired Sustained Attention Impairment: Verbal basic;Functional basic Memory: Impaired Memory Impairment: Decreased short term memory;Decreased recall of new information Decreased Short Term Memory: Verbal basic;Functional basic Awareness: Impaired Awareness Impairment: Emergent impairment Problem Solving: Impaired Problem Solving Impairment: Verbal basic;Functional basic Executive Function: Initiating Initiating: Impaired Initiating Impairment: Verbal basic;Functional basic Safety/Judgment: Impaired  Comprehension Auditory Comprehension Overall Auditory Comprehension: Appears within functional limits for tasks assessed Commands: Impaired One Step Basic Commands: 75-100% accurate Two Step Basic Commands: 75-100% accurate Conversation: Simple Interfering Components: Attention;Working Financial planner: Not  tested Reading Comprehension Reading Status: Not tested Expression Expression Primary Mode of Expression: Verbal Verbal Expression Overall Verbal Expression: Appears within functional limits for tasks assessed Non-Verbal Means of Communication: Not applicable Written Expression Dominant Hand: Left Written Expression: Not tested Oral Motor Oral Motor/Sensory Function Overall Oral Motor/Sensory Function: Generalized oral weakness Facial ROM: Reduced left Facial Symmetry: Abnormal symmetry left Lingual ROM: Within Functional Limits Lingual Symmetry: Within Functional Limits Velum: Within Functional Limits Mandible: Within Functional Limits Motor Speech Overall Motor Speech: Impaired Respiration: Within functional  limits Phonation: Normal Resonance: Within functional limits Articulation: Impaired Level of Impairment: Phrase Intelligibility: Intelligibility reduced Word: 75-100% accurate Phrase: 75-100% accurate Sentence: 75-100% accurate Conversation: 75-100% accurate Motor Planning: Not tested Effective Techniques: Over-articulate  Care Tool Care Tool Cognition Expression of Ideas and Wants Expression of Ideas and Wants: Frequent difficulty - frequently exhibits difficulty with expressing needs and ideas   Understanding Verbal and Non-Verbal Content Understanding Verbal and Non-Verbal Content: Sometimes understands - understands only basic conversations or simple, direct phrases. Frequently requires cues to understand   Memory/Recall Ability *first 3 days only Memory/Recall Ability *first 3 days only: That he or she is in a hospital/hospital unit (i am in a "health care facility")     Intelligibility: Intelligibility reduced Word: 75-100% accurate Phrase: 75-100% accurate Sentence: 75-100% accurate Conversation: 75-100% accurate  Bedside Swallowing Assessment General Date of Onset: 12/31/19 Previous Swallow Assessment: Bedside swallow eval 12/7 and 12/11 Diet  Prior to this Study: Dysphagia 1 (puree);Honey-thick liquids Temperature Spikes Noted: No Respiratory Status: Room air History of Recent Intubation: No Behavior/Cognition: Alert;Cooperative;Pleasant mood;Distractible Oral Cavity - Dentition: Missing dentition Self-Feeding Abilities: Able to feed self;Needs assist;Needs set up Vision: Functional for self-feeding Patient Positioning: Upright in bed Baseline Vocal Quality: Normal Volitional Cough: Strong Volitional Swallow: Able to elicit  Oral Care Assessment Does patient have any of the following "high(er) risk" factors?: None of the above Does patient have any of the following "at risk" factors?: Diet - patient on thickened liquids;Other - dysphagia Patient is AT RISK: Order set for Adult Oral Care Protocol initiated -  "At Risk Patients" option selected (see row information) Ice Chips Ice chips: Not tested Thin Liquid Thin Liquid: Impaired Presentation: Self Fed Oral Phase Impairments: Reduced labial seal Oral Phase Functional Implications: Right anterior spillage;Left anterior spillage Pharyngeal  Phase Impairments: Cough - Immediate;Throat Clearing - Delayed Nectar Thick Nectar Thick Liquid: Not tested Honey Thick Honey Thick Liquid: Within functional limits Puree Puree: Within functional limits Solid Solid: Impaired Presentation: Spoon;Self Fed Oral Phase Impairments: Impaired mastication;Reduced lingual movement/coordination Oral Phase Functional Implications: Prolonged oral transit;Impaired mastication;Oral residue BSE Assessment Risk for Aspiration Impact on safety and function: Mild aspiration risk Other Related Risk Factors: History of dysphagia;Cognitive impairment;Deconditioning  Short Term Goals: Week 1: SLP Short Term Goal 1 (Week 1): Pt will consume trials of ice/thin H2O with minimal overt s/sx aspiration X2 prior to repeat MBSS. SLP Short Term Goal 2 (Week 1): Pt will demonstrate efficient masticaiton and  oral clearance of Dys 3 textures with no more than Min A cues for swallow strategies prior to upgrade. SLP Short Term Goal 3 (Week 1): Pt will use compensatory strategies for speech intelligibility with Mod A verbal/visual cues. SLP Short Term Goal 4 (Week 1): Pt will demonstrate initiate request to communicate his wants and needs with Mod A multimodal cues. SLP Short Term Goal 5 (Week 1): Pt will sustain attention to tasks with Mod A for 5-7 minute intervals. SLP Short Term Goal 5 - Progress (Week 1): Progressing toward goal SLP Short Term Goal 6 (Week 1): Pt will recall new/daily information with Mod A for use of strategies/aids.  Refer to Care Plan for Long Term Goals  Recommendations for other services: None   Discharge Criteria: Patient will be discharged from SLP if patient refuses treatment 3 consecutive times without medical reason, if treatment goals not met, if there is a change in medical status, if patient makes no progress towards goals or if patient is discharged from hospital.  The  above assessment, treatment plan, treatment alternatives and goals were discussed and mutually agreed upon: by patient  Arbutus Leas 01/10/2020, 1:01 PM

## 2020-01-09 NOTE — Progress Notes (Signed)
Inpatient Rehabilitation Medication Review by a Pharmacist  A complete drug regimen review was completed for this patient to identify any potential clinically significant medication issues.  Clinically significant medication issues were identified:  no  Check AMION for pharmacist assigned to patient if future medication questions/issues arise during this admission.  Pharmacist comments:   Time spent performing this drug regimen review (minutes):  10   Mosetta Anis 01/09/2020 12:58 PM

## 2020-01-10 ENCOUNTER — Other Ambulatory Visit: Payer: Self-pay

## 2020-01-10 ENCOUNTER — Inpatient Hospital Stay (HOSPITAL_COMMUNITY): Payer: Medicare Other | Admitting: Occupational Therapy

## 2020-01-10 ENCOUNTER — Inpatient Hospital Stay (HOSPITAL_COMMUNITY): Payer: Medicare Other | Admitting: Speech Pathology

## 2020-01-10 ENCOUNTER — Encounter (HOSPITAL_COMMUNITY): Payer: Self-pay | Admitting: Physical Medicine & Rehabilitation

## 2020-01-10 ENCOUNTER — Inpatient Hospital Stay (HOSPITAL_COMMUNITY): Payer: Medicare Other | Admitting: Physical Therapy

## 2020-01-10 DIAGNOSIS — I63531 Cerebral infarction due to unspecified occlusion or stenosis of right posterior cerebral artery: Secondary | ICD-10-CM

## 2020-01-10 LAB — CBC WITH DIFFERENTIAL/PLATELET
Abs Immature Granulocytes: 0 10*3/uL (ref 0.00–0.07)
Basophils Absolute: 0.1 10*3/uL (ref 0.0–0.1)
Basophils Relative: 2 %
Eosinophils Absolute: 0.1 10*3/uL (ref 0.0–0.5)
Eosinophils Relative: 2 %
HCT: 36.4 % — ABNORMAL LOW (ref 39.0–52.0)
Hemoglobin: 12.3 g/dL — ABNORMAL LOW (ref 13.0–17.0)
Immature Granulocytes: 0 %
Lymphocytes Relative: 51 %
Lymphs Abs: 2.1 10*3/uL (ref 0.7–4.0)
MCH: 30.1 pg (ref 26.0–34.0)
MCHC: 33.8 g/dL (ref 30.0–36.0)
MCV: 89 fL (ref 80.0–100.0)
Monocytes Absolute: 0.3 10*3/uL (ref 0.1–1.0)
Monocytes Relative: 8 %
Neutro Abs: 1.5 10*3/uL — ABNORMAL LOW (ref 1.7–7.7)
Neutrophils Relative %: 37 %
Platelets: 208 10*3/uL (ref 150–400)
RBC: 4.09 MIL/uL — ABNORMAL LOW (ref 4.22–5.81)
RDW: 13 % (ref 11.5–15.5)
WBC: 4 10*3/uL (ref 4.0–10.5)
nRBC: 0 % (ref 0.0–0.2)

## 2020-01-10 LAB — COMPREHENSIVE METABOLIC PANEL
ALT: 27 U/L (ref 0–44)
AST: 21 U/L (ref 15–41)
Albumin: 3 g/dL — ABNORMAL LOW (ref 3.5–5.0)
Alkaline Phosphatase: 76 U/L (ref 38–126)
Anion gap: 8 (ref 5–15)
BUN: 12 mg/dL (ref 8–23)
CO2: 22 mmol/L (ref 22–32)
Calcium: 9.4 mg/dL (ref 8.9–10.3)
Chloride: 109 mmol/L (ref 98–111)
Creatinine, Ser: 1.04 mg/dL (ref 0.61–1.24)
GFR, Estimated: >60 mL/min (ref >60)
Glucose, Bld: 119 mg/dL — ABNORMAL HIGH (ref 70–99)
Potassium: 3.8 mmol/L (ref 3.5–5.1)
Sodium: 139 mmol/L (ref 135–145)
Total Bilirubin: 0.9 mg/dL (ref 0.3–1.2)
Total Protein: 6.3 g/dL — ABNORMAL LOW (ref 6.5–8.1)

## 2020-01-10 MED ORDER — INFLUENZA VAC A&B SA ADJ QUAD 0.5 ML IM PRSY
0.5000 mL | PREFILLED_SYRINGE | INTRAMUSCULAR | Status: AC
  Administered 2020-01-13: 15:00:00 0.5 mL via INTRAMUSCULAR
  Filled 2020-01-10 (×2): qty 0.5

## 2020-01-10 MED ORDER — BLOOD PRESSURE CONTROL BOOK
Freq: Once | Status: AC
  Filled 2020-01-10: qty 1

## 2020-01-10 MED ORDER — POLYETHYLENE GLYCOL 3350 17 G PO PACK
17.0000 g | PACK | Freq: Every day | ORAL | Status: DC
  Filled 2020-01-10: qty 1

## 2020-01-10 MED ORDER — SENNOSIDES-DOCUSATE SODIUM 8.6-50 MG PO TABS
2.0000 | ORAL_TABLET | Freq: Every day | ORAL | Status: DC
  Administered 2020-01-10 – 2020-01-15 (×6): 2 via ORAL
  Filled 2020-01-10 (×7): qty 2

## 2020-01-10 NOTE — Progress Notes (Signed)
Patient ID: Alexander Byrd, male   DOB: September 02, 1950, 69 y.o.   MRN: [id number]  Sw met with patient significant other in room with care coordinator Hilda Blades). Spouse stated patient sister Alexander Byrd) will continue to be main contact. SA reports that patient sister lives next door and sister will be able to assist in home 24/7. Additionally, patient daughter plans to move in with patient and provide care 24/7 as well. Sw will continue to follow up with family to ensure they are comfortable with discharge home. No additional questions or concerns.  [place], [place]

## 2020-01-10 NOTE — Progress Notes (Signed)
[place] PHYSICAL MEDICINE & REHABILITATION PROGRESS NOTE   Subjective/Complaints: No complaints this morning Right IV in place Hgb 12.3. BMP stable.  ROS: denies pain  Objective:   No results found. Recent Labs    01/10/20 0504  WBC 4.0  HGB 12.3*  HCT 36.4*  PLT 208   Recent Labs    01/09/20 0340 01/10/20 0504  NA 140 139  K 3.9 3.8  CL 109 109  CO2 24 22  GLUCOSE 115* 119*  BUN 14 12  CREATININE 1.07 1.04  CALCIUM 8.9 9.4    Intake/Output Summary (Last 24 hours) at 01/10/2020 1007 Last data filed at 01/09/2020 1832 Gross per 24 hour  Intake 40 ml  Output 100 ml  Net -60 ml        Physical Exam: Vital Signs Blood pressure 116/85, pulse 98, temperature 98.1 F (36.7 C), resp. rate 19, height 5\' 8"  (1.727 m), weight 69.7 kg, SpO2 95 %. Gen: no distress, normal appearing HEENT: oral mucosa pink and moist, NCAT Cardio: Reg rate Chest: normal effort, normal rate of breathing Abd: soft, non-distended Musculoskeletal:     Cervical back: Neck supple.     Comments: No edema or tenderness in extremities  Skin:    General: Skin is warm and dry.  Neurological:     Mental Status: He is alert.     Comments: Alert Limited attention and delay in processing.   ?  Expressive aphasia Follows simple commands. Motor: Appears to be 5/5 throughout  Psychiatric:        Mood and Affect: Affect is blunt.        Speech: Speech is delayed and slurred.        Behavior: Behavior is slowed.        Cognition and Memory: Cognition is impaired.    Assessment/Plan: 1. Functional deficits which require 3+ hours per day of interdisciplinary therapy in a comprehensive inpatient rehab setting.  Physiatrist is providing close team supervision and 24 hour management of active medical problems listed below.  Physiatrist and rehab team continue to assess barriers to discharge/monitor patient progress toward functional and medical goals  Care Tool:  Bathing               Bathing assist       Upper Body Dressing/Undressing Upper body dressing   What is the patient wearing?: Hospital gown only    Upper body assist      Lower Body Dressing/Undressing Lower body dressing            Lower body assist       Toileting Toileting    Toileting assist       Transfers Chair/bed transfer  Transfers assist           Locomotion Ambulation   Ambulation assist              Walk 10 feet activity   Assist           Walk 50 feet activity   Assist           Walk 150 feet activity   Assist           Walk 10 feet on uneven surface  activity   Assist           Wheelchair     Assist               Wheelchair 50 feet with 2 turns activity    Assist  Wheelchair 150 feet activity     Assist          Blood pressure 116/85, pulse 98, temperature 98.1 F (36.7 C), resp. rate 19, height 5\' 8"  (1.727 m), weight 69.7 kg, SpO2 95 %.  Medical Problem List and Plan: 1.  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             -patient may shower             -ELOS/Goals: 14-17 days/Min A             Initial CIR evaluations today.  2.  Antithrombotics: -DVT/anticoagulation: Lovenox             -antiplatelet therapy: Aspirin 81 mg daily and Plavix 75 mg daily x3 months then aspirin daily 3. Pain Management: Continue Tylenol as needed 4. Mood: Provide emotional support             -antipsychotic agents: N/A 5. Neuropsych: This patient is not capable of making decisions on his own behalf. 6. Skin/Wound Care: Routine skin checks. May d/c OV 7. Fluids/Electrolytes/Nutrition: Routine and outs.             CMP stable on 12/16 8.  Hypertension.  Norvasc 5 mg daily.               Monitor with increased mobility 9.  Hyperlipidemia.  Lipitor 10.  BPH.  Continue Flomax 0.4 mg daily.               PVRs  ordered 11.  Severe sepsis with acute enteritis.  Completed course of azithromycin.  Monitor hydration. 12.  AKI resolved.  Follow-up chemistries    LOS: 1 days A FACE TO FACE EVALUATION WAS PERFORMED  Ayana Imhof P Kambree Krauss 01/10/2020, 10:07 AM

## 2020-01-10 NOTE — Progress Notes (Signed)
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)  Past Medical History: History reviewed. No pertinent past medical history. Past Surgical History: History reviewed. No pertinent surgical history. Social History:  has an unknown smoking status. He has never used smokeless tobacco. He reports previous alcohol use. He reports previous drug use.  Family / Support Systems Marital Status: Single Spouse/Significant Other: Games developer (Significant Other) Other Supports: [name] (sister), [name] (sister) Ability/Limitations of Caregiver: Works during day ( arranging caregiver support per sister on 12/15) Caregiver Availability: 24/7  Social History Preferred language: English Religion:  Read: Yes Write: Yes Employment Status: Employed (Part time at [employer])   Abuse/Neglect Abuse/Neglect Assessment Can Be Completed: Yes Physical Abuse: Denies Verbal Abuse: Denies Sexual Abuse: Denies Exploitation of patient/patient's resources: Denies Self-Neglect: Denies  Emotional Status    Patient / Family Perceptions, Expectations & Goals Premorbid pt/family roles/activities: Independent prior to CVA 3 weeks ago, driving, working Anticipated changes in roles/activities/participation: Will anticipate assistance at discharge Pt/family expectations/goals: Goal to reach Sup to Min A Level. Return home with in home care and family assistance  [employer]: None Premorbid Home Care/DME Agencies: None Transportation available at discharge: family able to transport Resource referrals recommended: Neuropsychology (Coping)  Discharge Planning Living Arrangements: Spouse/significant other Support Systems: Spouse/significant other,Other relatives Type of Residence: Private residence [facility], Ramped  entrance) Care Facility Name: [facility] [place] Care Facility Name: [facility] Insurance Resources: [employer] Financial Resources: Restaurant manager, fast food Screen Referred: No Living Expenses: Rent Money Management: Patient,Significant Other Does the patient have any problems obtaining your medications?: No Home Management: Independent Patient/Family Preliminary Plans: Family will assit in home Care Coordinator Anticipated Follow Up Needs: HH/OP Expected length of stay: 2-3 Weeks  Clinical Impression SW called patient sister on 01/09/2020 to introduce self, explain and explain process. SW clarified with family that there is not interest of SNF at discharge. Sister reports she would like not like him to return and she prefers for patient to discharge home with in home care and support from family. SW entered patient room introduced self explained role. No additional questions or concerns from family. Will continue to follow up.   Andria Rhein 01/10/2020, 11:04 AM

## 2020-01-10 NOTE — Evaluation (Signed)
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 No pertinent past medical history. Past Surgical History: History reviewed. No pertinent surgical history.  Assessment & Plan Clinical Impression: Patient is a 69 y.o. 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  History taken from chart review due to cognition.  He was initially admitted to [facility] after his most recent CVA and was discharged to skilled nursing facility where he developed abdominal pain nausea and vomiting.  Patient with prior CVA and lives with spouse.  1 level home ramped entrance.  Was independent working part-time at a [employer], manufacturing systems and driving.  He presented to [facility] 12/31/2019.  EEG negative for seizures.  CT/MRI showed subtly increased diffusion abnormality involving the left pons middle cerebellar peduncle and adjacent left cerebellar hemisphere.  No other acute abnormality.  There was a few scattered remote lacunar infarcts about the left basal ganglia and corona radiata.  CT of abdomen pelvis showed findings suggestive of enteritis.  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  Short segment moderate stenosis involving the mid right M1 segment.  Echocardiogram with EF of 60-65%, no wall motion abnormalities grade 1 diastolic dysfunction.  Follow-up MRI completed findings consistent with left PICA infarction advised continue aspirin and Plavix.  Admission chemistries unremarkable aside glucose 169, creatinine 1.48, lipase 33, urinalysis negative nitrite with urine culture no growth, lactic acid 4.4, C. difficile negative, blood cultures no growth to date.  Hospital course work-up for sepsis related to acute enteritis he was placed on azithromycin x3 days as well as IV gentle hydration.  Latest creatinine improved 1.07 after gentle hydration.  He is currently on a dysphagia #1 honey thick liquid diet.  Subcutaneous Lovenox for DVT prophylaxis.  Therapy evaluations completed and patient was admitted for a comprehensive rehab program. Patient transferred to CIR on 01/09/2020 .   Patient currently requires max with mobility secondary to muscle weakness, muscle joint tightness and muscle paralysis, decreased cardiorespiratoy endurance, impaired timing and sequencing, unbalanced muscle activation, motor apraxia, ataxia, decreased coordination and decreased motor planning, decreased midline orientation, decreased attention to left and decreased motor planning, decreased initiation, decreased attention, decreased awareness, decreased problem solving, decreased safety awareness and delayed processing and decreased sitting balance, decreased standing balance, decreased postural control, hemiplegia and decreased balance strategies.  Prior to hospitalization, patient was modified independent  with mobility and lived with Significant other in a Mobile home home.  Home access is  Ramped entrance.  Patient will benefit from skilled PT intervention to maximize safe functional mobility, minimize fall risk and decrease  caregiver burden for planned discharge home with 24 hour assist.  Anticipate patient will benefit from follow up [facility] or SNF placement at discharge.  PT - End of Session Activity Tolerance: Tolerates 10 - 20 min activity with multiple rests Endurance Deficit: Yes PT Assessment Rehab Potential (ACUTE/IP ONLY): Good PT Barriers to Discharge: Lack of/limited family support;Behavior;Decreased caregiver support;Home environment access/layout PT Barriers to Discharge Comments: need to clarify home setup and caregiver support in further detail PT Patient demonstrates impairments in the following area(s): Balance;Behavior;Endurance;Motor;Safety;Perception;Sensory PT Transfers Functional Problem(s): Bed Mobility;Bed to Chair;Car;Furniture PT Locomotion Functional Problem(s): Ambulation;Wheelchair Mobility;Stairs PT Plan PT Intensity: Minimum of 1-2 x/day ,45 to 90 minutes PT Frequency: 5 out of 7 days PT Duration Estimated Length of Stay: 3 weeks PT Treatment/Interventions: Ambulation/gait training;Cognitive remediation/compensation;Discharge planning;DME/adaptive equipment instruction;Functional mobility training;Pain management;Psychosocial support;Splinting/orthotics;Therapeutic Activities;UE/LE Strength taining/ROM;Visual/perceptual remediation/compensation;Balance/vestibular training;Community reintegration;Disease management/prevention;Functional electrical stimulation;Neuromuscular re-education;Patient/family education;Stair training;Skin care/wound management;Therapeutic Exercise;UE/LE Coordination activities;Wheelchair propulsion/positioning PT Transfers Anticipated Outcome(s): min assist PT Locomotion Anticipated Outcome(s): mod assist ambulation, mod-I wc propulsion PT Recommendation Recommendations for Other Services: Therapeutic Recreation consult Therapeutic Recreation Interventions: Pet therapy;Outing/community reintergration Follow Up Recommendations: Skilled nursing facility;Home health  PT;24 hour supervision/assistance Patient destination: [place] (SNF) Equipment Recommended: Wheelchair (measurements);Rolling walker with 5" wheels;To be determined   PT Evaluation Precautions/Restrictions Precautions Precautions: Fall Precaution Comments: R>L hemi, R inattention. Poor postural control. General   Vital SignsTherapy Vitals Temp: 98.8 F (37.1 C) Temp Source: Oral Pulse Rate: (!) 107 Resp: 18 BP: (!) 130/97 Patient Position (if appropriate): Lying Oxygen Therapy SpO2: 97 % O2 Device: Room Air Pain Pain Assessment Pain Scale: 0-10 Pain Score: 0-No pain Faces Pain Scale: No hurt Home Living/Prior Functioning Home Living Available Help at Discharge: Family;Available 24 hours/day Type of Home: Mobile home Home Access: Ramped entrance Home Layout: One level Bathroom Shower/Tub: Government social research officer Accessibility: Yes Additional Comments: admitted from [facility] where recent d/c form [facility]  Lives With: Significant other Prior Function Level of Independence: Independent with basic ADLs  Able to Take Stairs?: Yes Vocation Requirements: works at [employer]: section pulled from previous versions due to cognition Vision/Perception     Cognition Overall Cognitive Status: Impaired/Different from baseline Arousal/Alertness: Awake/alert Risk analyst Light Touch: Appears Intact Coordination Gross Motor Movements are Fluid and Coordinated: No Fine Motor Movements are Fluid and Coordinated: No Coordination and Movement Description: needs extra time, very slow Finger Nose Finger Test: impaired L>R, but impaired on both Heel Shin Test: unable to place heel to either shin to complete, however, follows demonstration of motion Motor  Motor Motor: Hemiplegia;Motor apraxia;Ataxia;Abnormal postural alignment and control Motor - Skilled Clinical Observations: pt has bilateral weakness with R appearing  to be weaker than the left   Trunk/Postural Assessment  Cervical Assessment Cervical Assessment: Exceptions to [facility] (cervical kyphosis) Thoracic Assessment Thoracic Assessment: Exceptions to [facility] (flexed trunk, rounded shoulders, thoracic kyphosis) Lumbar Assessment Lumbar Assessment: Exceptions to [facility] (heavily flexed trunk) Postural Control Postural Control: Deficits on evaluation (pt has severe L lateral lean, some mild pushing with RUE, pt able to correct posture with multimodal cueing at bedside on evaluation with increased time)  Balance Balance Balance Assessed: Yes Static Sitting Balance Static Sitting - Balance Support: Bilateral upper extremity supported;Feet supported Static Sitting - Level of Assistance: 2: Max assist Dynamic Sitting Balance Dynamic Sitting - Balance Support: Right upper extremity supported;Left upper extremity supported Dynamic Sitting - Level of Assistance: 3: Mod assist Dynamic Sitting Balance - Compensations: flexed trunk Dynamic Sitting - Balance Activities: Reaching for objects;Trunk control activities;Lateral lean/weight shifting Static Standing Balance Static Standing - Balance Support: Bilateral upper extremity supported;During functional activity  Static Standing - Level of Assistance: 2: Max assist Dynamic Standing Balance Dynamic Standing - Balance Support: Bilateral upper extremity supported;During functional activity Dynamic Standing - Level of Assistance: 1: +2 Total assist;2: Max assist Dynamic Standing - Balance Activities: Lateral lean/weight shifting Dynamic Standing - Comments: needs max assist +2 for dynamic standing balance Extremity Assessment   RLE Assessment RLE Assessment: Exceptions to [facility] General Strength Comments: appears globally weaker than LLE, grossly 4/5 LLE Assessment LLE Assessment: Exceptions to [facility] General Strength Comments: grossly 4+/5 MMTs seated at bedside  Care Tool Care Tool Bed Mobility Roll left and right  activity   Roll left and right assist level: Moderate Assistance - Patient 50 - 74%    Sit to lying activity   Sit to lying assist level: Moderate Assistance - Patient 50 - 74%    Lying to sitting edge of bed activity   Lying to sitting edge of bed assist level: Moderate Assistance - Patient 50 - 74%     Care Tool Transfers Sit to stand transfer   Sit to stand assist level: 2 Helpers    Chair/bed transfer   Chair/bed transfer assist level: Moderate Assistance - Patient 50 - 74%     Toilet transfer Toilet transfer activity did not occur: Safety/medical concerns      Scientist, product/process development transfer activity did not occur: Safety/medical concerns        Care Tool Locomotion Ambulation   Assist level: 2 helpers   Max distance: 5  Walk 10 feet activity Walk 10 feet activity did not occur: Safety/medical concerns       Walk 50 feet with 2 turns activity Walk 50 feet with 2 turns activity did not occur: Safety/medical concerns      Walk 150 feet activity Walk 150 feet activity did not occur: Safety/medical concerns      Walk 10 feet on uneven surfaces activity Walk 10 feet on uneven surfaces activity did not occur: Safety/medical concerns      Stairs Stair activity did not occur: Safety/medical concerns        Walk up/down 1 step activity Walk up/down 1 step or curb (drop down) activity did not occur: Safety/medical concerns     Walk up/down 4 steps activity did not occuR: Safety/medical concerns  Walk up/down 4 steps activity      Walk up/down 12 steps activity Walk up/down 12 steps activity did not occur: Safety/medical concerns      Pick up small objects from floor Pick up small object from the floor (from standing position) activity did not occur: Safety/medical concerns      Wheelchair Will patient use wheelchair at discharge?: Yes Type of Wheelchair: Manual   Wheelchair assist level: Supervision/Verbal cueing Max wheelchair distance: 148ft  Wheel 50 feet with 2  turns activity   Assist Level: Supervision/Verbal cueing  Wheel 150 feet activity   Assist Level: Supervision/Verbal cueing    Refer to Care Plan for Long Term Goals  SHORT TERM GOAL WEEK 1 PT Short Term Goal 1 (Week 1): Pt will perform swing pivot transfer with min assist PT Short Term Goal 2 (Week 1): Pt will perform bed mobility with CGA PT Short Term Goal 3 (Week 1): Pt will ambulate with max assist of 1 and LRAD PT Short Term Goal 4 (Week 1): pt will demonstrate upright posture at bedside and maintain with dynamic movements  Recommendations for other services: Therapeutic Recreation  Pet therapy  Skilled Therapeutic Intervention Mobility Bed Mobility Bed Mobility:  Rolling Right;Right Sidelying to Sit;Sitting - Scoot to [employer] of Bed;Sit to Supine Rolling Right: Moderate Assistance - Patient 50-74% Right Sidelying to Sit: Moderate Assistance - Patient 50-74% Sitting - Scoot to Edge of Bed: Maximal Assistance - Patient 25-49% Sit to Supine: Minimal Assistance - Patient > 75% Transfers Transfers: Sit to Stand;Stand to Sit;Transfer via Electrical engineer Transfers Sit to Stand: 2 Helpers Stand to Sit: Moderate Assistance - Patient 50-74% Squat Pivot Transfers: Moderate Assistance - Patient 50-74% Transfer via Lift Equipment: Animal nutritionist: Yes Gait Assistance: 2 Holiday representative (Feet): 5 Feet Assistive device: Other (Comment) (3 musketeer) Gait Assistance Details: Verbal cues for technique;Verbal cues for gait pattern;Manual facilitation for weight shifting;Manual facilitation for weight bearing;Tactile cues for initiation;Tactile cues for sequencing;Tactile cues for weight beaing;Verbal cues for precautions/safety;Verbal cues for sequencing Gait Assistance Details: 3 musketeer Gait Gait: Yes Gait Pattern: Step-to pattern;Decreased step length - right;Decreased stance time - left;Decreased stride length;Decreased step length - left;Decreased  weight shift to right;Decreased weight shift to left;Lateral trunk lean to left;Trunk flexed;Narrow base of support Stairs / Additional Locomotion Stairs: No Architect: Yes Wheelchair Assistance: Chartered loss adjuster: Both upper extremities Wheelchair Parts Management: Needs assistance Distance: 1110ft  Pt received supine in bed and agreeable to PT. Pt supine>R sidelying with mod assist, sidelying> sit mod assist. 3x sit>stand at bedside with elevated bed with max +2 for hip extension, trunk control and R/L knee control. Swing pivot to wc on L with mod assist. Pt transported to gym total assist for time management and energy conservation. Pt perform gait activities listed above, attempted car transfer, and participated in wc propulsion as stated above. Pt returned to room in wc and performed sit>stand to steady with mod assist +2, then stand>sit in bed with mod assist for controlled descent. Pt left semi reclined in bed with wife at bedside. Call bell in reach, needs met, and bed alarm on.   Discharge Criteria: Patient will be discharged from PT if patient refuses treatment 3 consecutive times without medical reason, if treatment goals not met, if there is a change in medical status, if patient makes no progress towards goals or if patient is discharged from hospital.  The above assessment, treatment plan, treatment alternatives and goals were discussed and mutually agreed upon: by patient and by family  Gaylord Shih 01/10/2020, 6:01 PM

## 2020-01-10 NOTE — Evaluation (Signed)
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 No pertinent past medical history. Past Surgical History: History reviewed. No pertinent surgical history.  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 History taken from chart review due to cognition. He was initially admitted to [facility] after his most recent CVA and was discharged to skilled nursing facility where he developed abdominal pain nausea and vomiting. Patient with prior CVA and lives with spouse. 1 level home ramped entrance. Was independent working part-time at a [employer], manufacturing systems and driving. He presented to [facility] 12/31/2019. EEG negative for seizures. CT/MRI showed subtly increased diffusion abnormality involving the left pons middle cerebellar peduncle and adjacent left cerebellar hemisphere. No other acute abnormality. There was a few scattered remote lacunar infarcts about the left basal ganglia and corona radiata. CT of abdomen pelvis showed findings suggestive of enteritis. 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 Short segment moderate stenosis involving the mid right M1 segment. Echocardiogram with EF of 60-65%, no wall motion abnormalities grade 1 diastolic dysfunction. Follow-up MRI completed findings consistent with left PICA infarction advised continue aspirin and Plavix. Admission chemistries unremarkable aside glucose 169, creatinine 1.48, lipase 33, urinalysis negative nitrite with urine culture no growth, lactic acid 4.4, C. difficile negative, blood cultures no growth to date. Hospital course work-up for sepsis related to acute enteritis he was placed on azithromycin x3 days as well as IV gentle hydration. Latest creatinine improved 1.07 after gentle hydration. He is currently on a dysphagia #1 honey thick liquid diet. Subcutaneous Lovenox for DVT prophylaxis. Therapy evaluations completed and patient was admitted for a comprehensive rehab program. Please see preadmission assessment from earlier today as well.    Patient transferred to CIR on 01/09/2020 .    Patient currently requires total with basic self-care skills secondary to muscle weakness, decreased cardiorespiratoy endurance, impaired timing and sequencing, unbalanced muscle activation, decreased coordination and decreased motor planning, decreased attention to right and decreased motor planning, decreased initiation, decreased attention, decreased awareness, decreased problem solving, decreased safety awareness, decreased memory and delayed processing, central origin and decreased sitting balance, decreased standing balance, decreased postural control, hemiplegia and decreased balance strategies.  Prior to hospitalization 3 week ago, patient was fully independent.  Patient will benefit from skilled intervention to increase independence with basic self-care skills prior to discharge home with care partner.  Anticipate patient will require minimal physical assistance  and follow up home health.  OT - End of Session Endurance Deficit: Yes OT Assessment Rehab Potential (ACUTE ONLY): Fair OT Barriers to Discharge: Decreased caregiver support OT Barriers to Discharge Comments: pt will need min - mod A with self care  and can not be left alone OT Patient demonstrates impairments in the following area(s): Balance;Cognition;Endurance;Motor;Perception;Safety;Sensory OT Basic ADL's Functional Problem(s): Eating;Grooming;Bathing;Dressing;Toileting OT Transfers Functional Problem(s): Toilet;Tub/Shower OT Additional Impairment(s): Fuctional Use of Upper Extremity OT Plan OT Intensity: Minimum of 1-2 x/day, 45 to 90 minutes OT Frequency: 5 out of 7 days OT Duration/Estimated Length of Stay: 21 days OT Treatment/Interventions: Balance/vestibular training;Cognitive remediation/compensation;Discharge planning;Functional mobility training;DME/adaptive equipment instruction;Neuromuscular re-education;Patient/family education;Psychosocial support;Therapeutic Activities;Self Care/advanced ADL retraining;Therapeutic Exercise;UE/LE Strength taining/ROM;UE/LE Coordination activities;Visual/perceptual remediation/compensation OT Self Feeding Anticipated Outcome(s): set up OT Basic Self-Care Anticipated Outcome(s): min A OT Toileting Anticipated Outcome(s): min A OT Bathroom Transfers Anticipated Outcome(s): min A OT Recommendation Patient destination: Home Follow Up Recommendations: Home health OT Equipment Recommended: 3 in 1 bedside comode;Tub/shower bench   OT Evaluation Precautions/Restrictions  Precautions Precautions: Fall Precaution Comments: R>L hemi, R inattention. Poor postural control. Restrictions Weight Bearing Restrictions: No   Pain Pain Assessment Pain Scale: 0-10 Pain Score: 0-No pain Faces Pain Scale: No hurt Home Living/Prior Functioning Home Living Family/patient expects to be discharged to:: Private residence Living Arrangements:  Spouse/significant other Available Help at Discharge: Family,Available 24 hours/day Type of Home: Mobile home Home Access: Ramped entrance Home Layout: One level Bathroom Shower/Tub: Government social research officer Accessibility: Yes Additional Comments: admitted from [place] where recent d/c form [facility]  Lives With: Significant other Prior Function Level of Independence: Independent with basic ADLs  Able to Take Stairs?: Yes Vocation Requirements: works at [employer]: section pulled from previous versions due to cognition Vision Baseline Vision/History: No visual deficits Patient Visual Report: No change from baseline Vision Assessment?: Yes Eye Alignment: Within Functional Limits Ocular Range of Motion: Within Functional Limits Alignment/Gaze Preference: Gaze left Tracking/Visual Pursuits: Decreased smoothness of vertical tracking;Unable to hold eye position out of midline Saccades: Additional eye shifts occurred during testing;Additional head turns occurred during testing;Decreased speed of saccadic movement Visual Fields: No apparent deficits Perception  Perception: Impaired Inattention/Neglect: Does not attend to right side of body;Does not attend to right visual field Praxis Praxis: Impaired Praxis Impairment Details: Initiation;Motor planning Praxis-Other Comments: mod impaired initiation Cognition Overall Cognitive Status: Impaired/Different from baseline Arousal/Alertness: Awake/alert Orientation Level: Person;Place;Situation Person: Oriented Place: Oriented ("i am in a health care facility") Situation: Oriented Year: Other (Comment) (2005) Month: December Day of Week: Correct Memory: Impaired Memory Impairment: Decreased short term memory;Decreased recall of new information Decreased Short Term Memory: Verbal basic;Functional basic Immediate Memory Recall: Sock;Blue;Bed Memory Recall Sock: Without Cue Memory Recall Blue: Not  able to recall Memory Recall Bed: Not able to recall Attention: Sustained Sustained Attention: Impaired Sustained Attention Impairment: Verbal basic;Functional basic Awareness: Impaired Awareness Impairment: Emergent impairment Problem Solving: Impaired Problem Solving Impairment: Verbal basic;Functional basic Executive Function: Initiating Initiating: Impaired Initiating Impairment: Verbal basic;Functional basic Safety/Judgment: Impaired Sensation Sensation Light Touch: Appears Intact Hot/Cold: Appears Intact Proprioception: Impaired by gross assessment Stereognosis: Impaired by gross assessment Coordination Gross Motor Movements are Fluid and Coordinated: No Fine Motor Movements are Fluid and Coordinated: No Coordination and Movement Description: very slow and labored movement patterns Finger Nose Finger Test: able to reach finger to nose,  but unable to initiate for test Motor  Motor Motor: Hemiplegia Motor - Skilled Clinical Observations: pt has bilateral weakness with R appearing to be weaker than the left  Trunk/Postural Assessment  Cervical Assessment Cervical Assessment: Exceptions to [facility] (limited neck ROM to R) Thoracic Assessment Thoracic Assessment: Exceptions to [facility] (rounded shoulders) Lumbar Assessment Lumbar Assessment: Exceptions to [facility] Postural Control Postural  Control: Deficits on evaluation (unable to hold upright posture or even achieve the position as he was severely leaning to his Left)  Balance Static Sitting Balance Static Sitting - Level of Assistance: 2: Max assist Static Sitting - Comment/# of Minutes: unable to sit upright Dynamic Sitting Balance Sitting balance - Comments: total A, unable to reach dynamically Extremity/Trunk Assessment RUE Assessment RUE Assessment: Exceptions to [facility] Passive Range of Motion (PROM) Comments: limited sh range due to immobility Active Range of Motion (AROM) Comments: sh flexion to 100, can move distally but very  slow General Strength Comments: 2-/5 sh, 3-/5 distally LUE Assessment Passive Range of Motion (PROM) Comments: limited shoulder range of motion Active Range of Motion (AROM) Comments: sh flexion to 100, can move distally but very slow General Strength Comments: 2-/5 sh, 3-/5 distally  Care Tool Care Tool Self Care Eating   Eating Assist Level: Minimal Assistance - Patient > 75% (swallowing strategies)    Oral Care    Oral Care Assist Level: Moderate Assistance - Patient 50 - 74%    Bathing   Body parts bathed by patient: Chest;Front perineal area;Abdomen;Face Body parts bathed by helper: Right arm;Left arm;Buttocks;Right upper leg;Left upper leg;Right lower leg;Left lower leg   Assist Level: Maximal Assistance - Patient 24 - 49%    Upper Body Dressing(including orthotics)   What is the patient wearing?: Pull over shirt   Assist Level: Moderate Assistance - Patient 50 - 74%    Lower Body Dressing (excluding footwear)   What is the patient wearing?: Incontinence brief;Pants Assist for lower body dressing: Total Assistance - Patient < 25%    Putting on/Taking off footwear   What is the patient wearing?: Ted hose;Non-skid slipper socks Assist for footwear: Total Assistance - Patient < 25%       Care Tool Toileting Toileting activity Toileting Activity did not occur (Clothing management and hygiene only): N/A (no void or bm)       Care Tool Bed Mobility Roll left and right activity   Roll left and right assist level: Moderate Assistance - Patient 50 - 74%    Sit to lying activity   Sit to lying assist level: Total Assistance - Patient < 25%    Lying to sitting edge of bed activity   Lying to sitting edge of bed assist level: Total Assistance - Patient < 25%     Care Tool Transfers Sit to stand transfer Sit to stand activity did not occur: Safety/medical concerns      Chair/bed transfer Chair/bed transfer activity did not occur: Safety/medical concerns       Toilet  transfer Toilet transfer activity did not occur: Safety/medical concerns       Care Tool Cognition Expression of Ideas and Wants Expression of Ideas and Wants: Frequent difficulty - frequently exhibits difficulty with expressing needs and ideas   Understanding Verbal and Non-Verbal Content Understanding Verbal and Non-Verbal Content: Sometimes understands - understands only basic conversations or simple, direct phrases. Frequently requires cues to understand   Memory/Recall Ability *first 3 days only Memory/Recall Ability *first 3 days only: That he or she is in a hospital/hospital unit (i am in a "health care facility")    Refer to [place] for [place] 1 OT Short Term Goal 1 (Week 1): Pt will sit to EOB with mod A. OT Short Term Goal 2 (Week 1): Pt will maintain static sit at EOB with min A to engage in self care.  OT Short Term Goal 3 (Week 1): Pt will be able to bathe both arms with CGA. OT Short Term Goal 4 (Week 1): Pt will transfer to [facility] with max A.  Recommendations for other services: None    Skilled Therapeutic Intervention ADL ADL Eating: Minimal assistance Grooming: Moderate assistance Upper Body Bathing: Moderate assistance Where Assessed-Upper Body Bathing: Bed level Lower Body Bathing: Maximal assistance Where Assessed-Lower Body Bathing: Bed level Upper Body Dressing: Moderate assistance Where Assessed-Upper Body Dressing: Bed level Lower Body Dressing: Maximal assistance Where Assessed-Lower Body Dressing: Bed level Toilet Transfer: Not assessed     Pt seen for initial evaluation and ADL training.  Pt has very limited postural control which was observed after sitting him to the EOB.  Unsafe without +2 A to transfer to a wc and not sure he would be safe to sit in a standard wc. (informed PT that he might need a tilt and space).  Bed level selfcare performed of bathing and dressing with pt demonstrating very slow processing.  See  details on cognition above.  Pt very cooperative but needs a great deal of A.   Reviewed pt's goals, OT POC, ELOS. Pt resting in bed with alarm set and all needs met.    Discharge Criteria: Patient will be discharged from OT if patient refuses treatment 3 consecutive times without medical reason, if treatment goals not met, if there is a change in medical status, if patient makes no progress towards goals or if patient is discharged from hospital.  The above assessment, treatment plan, treatment alternatives and goals were discussed and mutually agreed upon: by patient  [place] 01/10/2020, 1:14 PM

## 2020-01-10 NOTE — Progress Notes (Signed)
Patient ID: Sebastain Fishbaugh, male   DOB: 1950/09/23, 69 y.o.   MRN: [id number] Met with the patient and his wife to review role of the nurse CM and collaboration with the SW to facilitate preparation for discharge. Reviewed secondary stroke risks including previous stroke, HTN, prediabetes. Wife denies smoking and ETOH history. Reported patient was non compliant taking medications PTA. Goal is to return home with the wife and family to support care needed. Patient and wife given handouts on HTN management, ASA and eating plan after a stroke with concentration on carbohydrate counting for prediabetes. Wife asked about plan to discharge in a hospital bed however patient is able to move all extremities and has a ramped entry to the one level home. Discussed team conference and therapy recommendations for DME however do not anticipate a need for a hospital bed at discharge. Wife reports they have a BSC, RW and rollator at home already. No other questions or concerns noted at present. Continue to follow along with the patient to discharge and address educational needs. Margarito Liner

## 2020-01-10 NOTE — Progress Notes (Signed)
Inpatient Rehabilitation  Patient information reviewed and entered into eRehab system by Tonetta Napoles M. Aul Mangieri, M.A., CCC/SLP, PPS Coordinator.  Information including medical coding, functional ability and quality indicators will be reviewed and updated through discharge.    

## 2020-01-10 NOTE — Progress Notes (Signed)
Inpatient Rehabilitation Center Individual Statement of Services  Patient Name:  Alexander Byrd  Date:  01/10/2020  Welcome to the Inpatient Rehabilitation Center.  Our goal is to provide you with an individualized program based on your diagnosis and situation, designed to meet your specific needs.  With this comprehensive rehabilitation program, you will be expected to participate in at least 3 hours of rehabilitation therapies Monday-Friday, with modified therapy programming on the weekends.  Your rehabilitation program will include the following services:  Physical Therapy (PT), Occupational Therapy (OT), Speech Therapy (ST), 24 hour per day rehabilitation nursing, Therapeutic Recreaction (TR), Neuropsychology, Care Coordinator, Rehabilitation Medicine, Nutrition Services, Pharmacy Services and Other  Weekly team conferences will be held on Wedneday to discuss your progress.  Your Inpatient Rehabilitation Care Coordinator will talk with you frequently to get your input and to update you on team discussions.  Team conferences with you and your family in attendance may also be held.  Expected length of stay: 14-17 Days  Overall anticipated outcome: Min A  Depending on your progress and recovery, your program may change. Your Inpatient Rehabilitation Care Coordinator will coordinate services and will keep you informed of any changes. Your Inpatient Rehabilitation Care Coordinator's name and contact numbers are listed  below.  The following services may also be recommended but are not provided by the Inpatient Rehabilitation Center:    Home Health Rehabiltiation Services  Outpatient Rehabilitation Services    Arrangements will be made to provide these services after discharge if needed.  Arrangements include referral to agencies that provide these services.  Your insurance has been verified to be:  Medicare Your primary doctor is:  Beverly Sessions, MD  Pertinent information will be  shared with your doctor and your insurance company.  Inpatient Rehabilitation Care Coordinator:  Lavera Guise, [place] [phone] or [phone]  Information discussed with and copy given to patient by: Andria Rhein, 01/10/2020, 10:43 AM

## 2020-01-11 ENCOUNTER — Inpatient Hospital Stay (HOSPITAL_COMMUNITY): Payer: Medicare Other | Admitting: Speech Pathology

## 2020-01-11 ENCOUNTER — Inpatient Hospital Stay (HOSPITAL_COMMUNITY): Payer: Medicare Other | Admitting: Occupational Therapy

## 2020-01-11 ENCOUNTER — Inpatient Hospital Stay (HOSPITAL_COMMUNITY): Payer: Medicare Other | Admitting: Physical Therapy

## 2020-01-11 NOTE — Progress Notes (Signed)
mOccupational Therapy Session Note  Patient Details  Name: Alexander Byrd MRN: [id number] Date of Birth: Jul 18, 1950  Today's Date: 01/11/2020 OT Individual Time: 4081-4481 OT Individual Time Calculation (min): 56 min    Short Term Goals: Week 1:  OT Short Term Goal 1 (Week 1): Pt will sit to EOB with mod A. OT Short Term Goal 2 (Week 1): Pt will maintain static sit at EOB with min A to engage in self care. OT Short Term Goal 3 (Week 1): Pt will be able to bathe both arms with CGA. OT Short Term Goal 4 (Week 1): Pt will transfer to [facility] with max A.  Skilled Therapeutic Interventions/Progress Updates:    Pt greeted in bed, requesting to use the "potty." Mod A for supine<sit with pt actively assisting therapist. Mod A of 1 for sitting balance EOB due to Lt lean while NT set up the bariatric Stedy. Pt completed sit<stand with Mod A of 1 and CGA of 2nd helper. Pt with mild Lt lean while semi perched in [place] with Min balance assist provided while 2nd person steered Stedy to [facility]. Pt was provided with increased time to void, vcs for using the mirror to improve his postural alignment and also to keep his head up. Pt improving his midline orientation with cues, visual feedback, and mod manual facilitation.  He completed UB bathing/dressing tasks while sitting on BSC. Min-Mod balance assistance while uniaterally or bilaterally engaged in stated tasks and also when applying deodorant to underarms. He required step by step cues for sequencing but appeared motivated, noted some delayed processing. Mod A of 1 for sit<stand in Stedy for 2nd helper to complete pericare. While brief was being donned, noted increased onset of fatigue with pt requiring Mod balance assist and vcs to keep his head up while semi perched for this aspect of ADL and for transfer back to bed. With [facility] raised, pt able to obtain 4 position bilaterally, assistance for maintaining position while he engaged in LB bathing/dressing tasks given  cuing. OT donned Teds and gripper socks. He was able to pull pants up halfway past hips with assistance for maintaining bridged position. Rolling Rt>Lt then completed to fully elevate pants. Pt remained in bed at close of session, left with all needs within reach and bed alarm set. Tx focus placed on postural control, functional transfers, sit<stands, functional cognition/praxis, and OOB tolerance.   Therapy Documentation Precautions:  Precautions Precautions: Fall Precaution Comments: R>L hemi, R inattention. Poor postural control. Restrictions Weight Bearing Restrictions: No Pain: no s/s pain during tx   ADL: ADL Eating: Minimal assistance Grooming: Moderate assistance Upper Body Bathing: Moderate assistance Where Assessed-Upper Body Bathing: Bed level Lower Body Bathing: Maximal assistance Where Assessed-Lower Body Bathing: Bed level Upper Body Dressing: Moderate assistance Where Assessed-Upper Body Dressing: Bed level Lower Body Dressing: Maximal assistance Where Assessed-Lower Body Dressing: Bed level Toilet Transfer: Not assessed      Therapy/Group: Individual Therapy  Laelyn Blumenthal A Janijah Symons 01/11/2020, 12:20 PM

## 2020-01-11 NOTE — Progress Notes (Signed)
Physical Therapy Session Note  Patient Details  Name: Alexander Byrd MRN: [id number] Date of Birth: [birth year]  Today's Date: 01/11/2020 PT Individual Time: 1100-1200 PT Individual Time Calculation (min): 60 min   Short Term Goals: Week 1:  PT Short Term Goal 1 (Week 1): Pt will perform swing pivot transfer with min assist PT Short Term Goal 2 (Week 1): Pt will perform bed mobility with CGA PT Short Term Goal 3 (Week 1): Pt will ambulate with max assist of 1 and LRAD PT Short Term Goal 4 (Week 1): pt will demonstrate upright posture at bedside and maintain with dynamic movements  Skilled Therapeutic Interventions/Progress Updates:    Pt received in supine and agreeable to PT. Pt came to sit on R EOB with min assist and mod assist to scoot to EOB. PT instructed pt in seated balance at EOB. Pt initially appeared to have improvements in static seated balance when compared to evaluation, however, when asked to lift R hand for activity and return it to his side, pt unable to maintain static sitting balance. Heavy emphasis on seated balance this session. Squat pivot transfer to L with mod assist for sequencing, anterior lean, verbal cueing, and LLE blocking. Pt transported to gym total assist for time management and energy conservation. Stedy transfer with min assist for hip extension to mat table. Pt immediately unable to hold static sitting balance at edge of mat.   Dynamic seated balance/anterior weight shifting: -mirror feedback for sitting balance 1x10 while PT providing tactile cueing for erect posture and manual placement of BUE for support. Pt requiring mod assist to maintain balance due to heavy L lateral lean onto elbow.  -matching cards placed anterior and at eye level to encourage weight shifting due to noted posterolateral L resting position. Pt able to accurately match ~75% of cards and required only min assist for added ROM to reach selected match. 15 cards placed -Anterior weight  shifting rolling blue theraball 2x10: pt needs mod assist to roll ball out while maintaining LUE position and overpressure provided to reach full anterior position available. Pt needs verbal cueing and min assist in order to return to erect trunk posture between each rep. Transitioned to rolling anterolateral to R in order to encourage increased midline orientation.   Stedy transfer back to wc with min assist for hip extension. Pt transported back to room total assist. Steady transfer in similar fashion as before to bed. Pt requiring min assist to sit with controlled descent. Pt returned to supine with mod assist for BLE management. Pt left supine in bed with needs met, call bell in reach, and bed alarm on.   Therapy Documentation Precautions:  Precautions Precautions: Fall Precaution Comments: R>L hemi, R inattention. Poor postural control. Restrictions Weight Bearing Restrictions: No    Therapy/Group: Individual Therapy  Gaylord Shih 01/11/2020, 12:58 PM

## 2020-01-11 NOTE — Progress Notes (Signed)
[facility] PHYSICAL MEDICINE & REHABILITATION PROGRESS NOTE   Subjective/Complaints: No complaints this morning Significant aphasia  ROS: denies pain  Objective:   No results found. Recent Labs    01/10/20 0504  WBC 4.0  HGB 12.3*  HCT 36.4*  PLT 208   Recent Labs    01/09/20 0340 01/10/20 0504  NA 140 139  K 3.9 3.8  CL 109 109  CO2 24 22  GLUCOSE 115* 119*  BUN 14 12  CREATININE 1.07 1.04  CALCIUM 8.9 9.4    Intake/Output Summary (Last 24 hours) at 01/11/2020 1127 Last data filed at 01/11/2020 0454 Gross per 24 hour  Intake 145 ml  Output 150 ml  Net -5 ml        Physical Exam: Vital Signs Blood pressure 133/83, pulse 94, temperature 97.9 F (36.6 C), resp. rate 18, height 5\' 8"  (1.727 m), weight 69.7 kg, SpO2 95 %. Gen: no distress, normal appearing HEENT: oral mucosa pink and moist, NCAT Cardio: Reg rate Chest: normal effort, normal rate of breathing Abd: soft, non-distended Musculoskeletal:     Cervical back: Neck supple.     Comments: No edema or tenderness in extremities  Skin:    General: Skin is warm and dry.  Neurological:     Mental Status: He is alert.     Comments: Alert Limited attention and delay in processing.   ?  Expressive aphasia Follows simple commands. Motor: Appears to be 5/5 throughout  Psychiatric:        Mood and Affect: Affect is blunt.        Speech: Speech is delayed and slurred.        Behavior: Behavior is slowed.        Cognition and Memory: Cognition is impaired.    Assessment/Plan: 1. Functional deficits which require 3+ hours per day of interdisciplinary therapy in a comprehensive inpatient rehab setting.  Physiatrist is providing close team supervision and 24 hour management of active medical problems listed below.  Physiatrist and rehab team continue to assess barriers to discharge/monitor patient progress toward functional and medical goals  Care Tool:  Bathing    Body parts bathed by patient:  Chest,Front perineal area,Abdomen,Face,Right arm,Left arm,Right upper leg,Left upper leg   Body parts bathed by helper: Buttocks,Right lower leg,Left lower leg     Bathing assist Assist Level: 2 Helpers (using Stedy for sitting support)     Upper Body Dressing/Undressing Upper body dressing   What is the patient wearing?: Pull over shirt    Upper body assist Assist Level: Maximal Assistance - Patient 25 - 49%    Lower Body Dressing/Undressing Lower body dressing      What is the patient wearing?: Incontinence brief,Pants     Lower body assist Assist for lower body dressing: Maximal Assistance - Patient 25 - 49% (pt able to thread Lt LE into pants in bed given cuing alone)     Toileting Toileting Toileting Activity did not occur (Clothing management and hygiene only): N/A (no void or bm)  Toileting assist Assist for toileting: 2 Helpers (using Stedy lift)     Transfers Chair/bed transfer  Transfers assist  Chair/bed transfer activity did not occur: Safety/medical concerns  Chair/bed transfer assist level: Moderate Assistance - Patient 50 - 74%     Locomotion Ambulation   Ambulation assist      Assist level: 2 helpers   Max distance: 5   Walk 10 feet activity   Assist  Walk 10 feet activity  did not occur: Safety/medical concerns        Walk 50 feet activity   Assist Walk 50 feet with 2 turns activity did not occur: Safety/medical concerns         Walk 150 feet activity   Assist Walk 150 feet activity did not occur: Safety/medical concerns         Walk 10 feet on uneven surface  activity   Assist Walk 10 feet on uneven surfaces activity did not occur: Safety/medical concerns         Wheelchair     Assist Will patient use wheelchair at discharge?: Yes Type of Wheelchair: Manual    Wheelchair assist level: Supervision/Verbal cueing Max wheelchair distance: 138ft    Wheelchair 50 feet with 2 turns  activity    Assist        Assist Level: Supervision/Verbal cueing   Wheelchair 150 feet activity     Assist      Assist Level: Supervision/Verbal cueing   Blood pressure 133/83, pulse 94, temperature 97.9 F (36.6 C), resp. rate 18, height 5\' 8"  (1.727 m), weight 69.7 kg, SpO2 95 %.  Medical Problem List and Plan: 1.  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             -patient may shower             -ELOS/Goals: 14-17 days/Min A             Continue CIR 2.  Antithrombotics: -DVT/anticoagulation: Lovenox             -antiplatelet therapy: Aspirin 81 mg daily and Plavix 75 mg daily x3 months then aspirin daily 3. Pain Management: Continue Tylenol as needed 4. Mood: Provide emotional support             -antipsychotic agents: N/A 5. Neuropsych: This patient is not capable of making decisions on his own behalf. 6. Skin/Wound Care: Routine skin checks. May d/c OV 7. Fluids/Electrolytes/Nutrition: Routine and outs.             CMP stable on 12/16 8.  Hypertension.  Well controlled, continue Norvasc 5 mg daily.               Monitor with increased mobility 9.  Hyperlipidemia.  Lipitor 10.  BPH.  Continue Flomax 0.4 mg daily.               PVRs ordered 11.  Severe sepsis with acute enteritis.  Completed course of azithromycin.  Monitor hydration. 12.  AKI resolved.  Repeat BMP on Monday.     LOS: 2 days A FACE TO FACE EVALUATION WAS PERFORMED  Tuesday P Joud Ingwersen 01/11/2020, 11:27 AM

## 2020-01-11 NOTE — Progress Notes (Signed)
Speech Language Pathology Daily Session Note  Patient Details  Name: Alexander Byrd MRN: [id number] Date of Birth: 01-06-1951  Today's Date: 01/11/2020 SLP Individual Time: 1300-1356 SLP Individual Time Calculation (min): 56 min  Short Term Goals: Week 1: SLP Short Term Goal 1 (Week 1): Pt will consume trials of ice/thin H2O with minimal overt s/sx aspiration X2 prior to repeat MBSS. SLP Short Term Goal 2 (Week 1): Pt will demonstrate efficient masticaiton and oral clearance of Dys 3 textures with no more than Min A cues for swallow strategies prior to upgrade. SLP Short Term Goal 3 (Week 1): Pt will use compensatory strategies for speech intelligibility with Mod A verbal/visual cues. SLP Short Term Goal 4 (Week 1): Pt will demonstrate initiate request to communicate his wants and needs with Mod A multimodal cues. SLP Short Term Goal 5 (Week 1): Pt will sustain attention to tasks with Mod A for 5-7 minute intervals. SLP Short Term Goal 5 - Progress (Week 1): Progressing toward goal SLP Short Term Goal 6 (Week 1): Pt will recall new/daily information with Mod A for use of strategies/aids.  Skilled Therapeutic Interventions: Pt was seen for skilled ST targeting dysphagia and cognitive-linguistic goals. SLP facilitated session with trials of ice and thin H2O after oral care. He exhibited no overt s/sx aspiration in 10/10 ice chips and 3/3 self fed cup sips thin H2O. Immediate cough response noted in 1/10 tsp presentations of thin H2O. Although unable to determine in confidence on bedside observation (without instrumentation), suspect pt's swallow could be delayed, as it took between 3-6 seconds prior to hyolaryngeal movement observed. It did become more timely as intake progressed. Will plan for MBSS to instrumentally assess swallow function on Monday 01/14/20. Recommend continue current diet for now. SLP also assisted pt with intake of current Dys 2/honey thick liquid lunch items. He used safe  swallow precautions with Min A verbal cues from clinician. Mastication was efficient and full oral clearance achieved. He does require extra time for self feeding, due to slow processing and movements, as well as cues for initiation, initially. During a basic 3-step action card sequencing task, pt only required extra time and 1 demonstration cue in order to accurately sequence and for functional problem solving. Throughout session, Min-Mod cues for slow speech rate and overarticulation provided to increase speech intelligibility to ~85% at sentence level, especially during more cognitively demanding tasks such as sequencing. Pt responded to ~85% of SLP's questions with Min A for verbal initiation. Pt able to recall general plans for MBS Monday at end of session with Min A cues for recall (~25-30 minute delay since discussed with him). Pt left laying in bed with needs met to his satisfaction, call bell in lap, bed alarm set. Continue per current plan of care.       Pain Pain Assessment Pain Scale: 0-10 Pain Score: 0-No pain  Therapy/Group: Individual Therapy  Alexander Byrd 01/11/2020, 7:30 AM

## 2020-01-11 NOTE — IPOC Note (Signed)
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)     Functional Problem List: Nursing Bladder,Bowel,Medication Management,Motor,Nutrition,Safety  PT Balance,Behavior,Endurance,Motor,Safety,Perception,Sensory  OT Balance,Cognition,Endurance,Motor,Perception,Safety,Sensory  SLP Cognition,Linguistic,Nutrition  TR         Basic ADL's: OT Eating,Grooming,Bathing,Dressing,Toileting     Advanced  ADL's: OT       Transfers: PT Bed Mobility,Bed to Chair,Car,Furniture  OT Toilet,Tub/Shower     Locomotion: PT Ambulation,Wheelchair Mobility,Stairs     Additional Impairments: OT Fuctional Use of Upper Extremity  SLP Swallowing,Communication,Social Cognition expression Attention,Memory,Awareness  TR      Anticipated Outcomes Item Anticipated Outcome  Self Feeding set up  Swallowing  Supervision A   Basic self-care  min A  Toileting  min A   Bathroom Transfers min A  Bowel/Bladder  reduce incidence of incontinence  Transfers  min assist  Locomotion  mod assist ambulation, mod-I wc propulsion  Communication  Supervision A  Cognition  Supervision A  Pain  no pain or less than 2  Safety/Judgment  no falls, skin breakdown, infection   Therapy Plan: PT Intensity: Minimum of 1-2 x/day ,45 to 90 minutes PT Frequency: 5 out of 7 days PT Duration Estimated Length of Stay: 3 weeks OT Intensity: Minimum of 1-2 x/day, 45 to 90 minutes OT Frequency: 5 out of 7 days OT Duration/Estimated Length of Stay: 21 days SLP Intensity: Minumum of 1-2 x/day, 30 to 90 minutes SLP Frequency: 3 to 5 out of 7 days SLP Duration/Estimated Length of Stay: 3 weeks   Due to the current state of emergency, patients may not be receiving their  3-hours of Medicare-mandated therapy.   Team Interventions: Nursing Interventions Patient/Family Education,Bladder Management,Bowel Management,Disease Management/Prevention,Medication Management,Cognitive Remediation/Compensation,Dysphagia/Aspiration Precaution Oceanographer  PT interventions Ambulation/gait training,Cognitive remediation/compensation,Discharge planning,DME/adaptive equipment instruction,Functional mobility training,Pain management,Psychosocial support,Splinting/orthotics,Therapeutic Activities,UE/LE Strength taining/ROM,Visual/perceptual remediation/compensation,Balance/vestibular training,Community reintegration,Disease management/prevention,Functional electrical stimulation,Neuromuscular re-education,Patient/family education,Stair training,Skin care/wound management,Therapeutic Exercise,UE/LE Psychiatrist propulsion/positioning  OT Interventions Balance/vestibular training,Cognitive remediation/compensation,Discharge planning,Functional mobility training,DME/adaptive equipment instruction,Neuromuscular re-education,Patient/family education,Psychosocial support,Therapeutic Activities,Self Care/advanced ADL retraining,Therapeutic Exercise,UE/LE Strength taining/ROM,UE/LE Coordination activities,Visual/perceptual remediation/compensation  SLP Interventions Cognitive remediation/compensation,Cueing hierarchy,Patient/family education,Therapeutic Activities,Functional tasks,Dysphagia/aspiration precaution training,Internal/external aids,Speech/Language facilitation  TR Interventions    SW/CM Interventions Psychosocial Support,Discharge Planning,Patient/Family Education,Disease Management/Prevention   Barriers to Discharge MD  Medical stability  Nursing      PT Lack of/limited family support,Behavior,Decreased caregiver support,Home environment access/layout need to clarify home setup and caregiver support in further detail  OT  Decreased caregiver support pt will need min - mod A with self care and can not be left alone  SLP Nutrition means    SW       Team Discharge Planning: Destination: PT-Skilled Nursing Facility (SNF) ,OT- Home , SLP-Home Projected Follow-up: PT-Skilled nursing facility,Home health PT,24 hour supervision/assistance, OT-  Home health OT, SLP-Home Health SLP,24 hour supervision/assistance Projected Equipment Needs: PT-Wheelchair (measurements),Rolling walker with 5" wheels,To be determined, OT- 3 in 1 bedside comode,Tub/shower bench, SLP-None recommended by SLP Equipment Details: PT- , OT-  Patient/family involved in discharge planning: PT- Family member/caregiver,Patient,  OT-Patient, SLP-Patient  MD ELOS: 14-17 days Medical Rehab Prognosis:  Excellent Assessment: 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 Labs are being monitored  weekly given his AKI and severe sepsis. Vitals are being monitored TID given his HTN.   See Team Conference Notes for weekly updates to the plan of care

## 2020-01-12 ENCOUNTER — Inpatient Hospital Stay (HOSPITAL_COMMUNITY): Payer: Medicare Other | Admitting: Physical Therapy

## 2020-01-12 ENCOUNTER — Inpatient Hospital Stay (HOSPITAL_COMMUNITY): Payer: Medicare Other | Admitting: Speech Pathology

## 2020-01-12 ENCOUNTER — Inpatient Hospital Stay (HOSPITAL_COMMUNITY): Payer: Medicare Other

## 2020-01-12 NOTE — Progress Notes (Addendum)
Physical Therapy Session Note  Patient Details  Name: Alexander Byrd MRN: [id number] Date of Birth: [birth year]  Today's Date: 01/12/2020 PT Individual Time: 1105-1200 PT Individual Time Calculation (min): 55 min   Short Term Goals: Week 1:  PT Short Term Goal 1 (Week 1): Pt will perform swing pivot transfer with min assist PT Short Term Goal 2 (Week 1): Pt will perform bed mobility with CGA PT Short Term Goal 3 (Week 1): Pt will ambulate with max assist of 1 and LRAD PT Short Term Goal 4 (Week 1): pt will demonstrate upright posture at bedside and maintain with dynamic movements  Skilled Therapeutic Interventions/Progress Updates:  Session 1  Pt received supine in bed and agreeable to PT. Supine>sit transfer with mod assist to improve sequencing and then use of RUE to push in to sitting as well as perform scoot to improve BLE positioning on floor. Sit>stand in steady with min assist  and min cues for UE support. Pt required mod assist once in standing to maintain midline and prevent forward/L LOB.   Pt transported to rehab gym in [facility]. Sit<>stand with RW x 3 with mod assist and BUE support on RW; PT required facilitate anterior weight shift and stabilize RW to limit need for UE movement once in standing. pregait reciprocal stepping 2 x 3 BLE with cues for terminal knee extension and attention to mirror to prevent L lateral lean, pt noted to faad into crouched position with fatigue in each bout.   Stair management training to ascend/descend 2 steps (3") with mod assist from PT and moderate cues for improved midline orientation and terminal knee extension on the L.   Gait training with RW x 27f with max assist to facilitate terminal knee extension on the L, weight shift R to allow LE advancement, and improved stability of RW. Pt continued to fade into crouched posture with fatigue and inability to correct.   WC mobility with BUE [id number] supervision assist and only min cues for  turning technique and improved awareness of door frames.   [facility] to [facility] and [facility] and all needs met.    SesWeyerhaeuserPt asleep in bed and unable to be aroused due to fatigue from previous treatment sessions. Pt missed 45 min skilled treatment. Will re-attempt at later time/date.      Therapy Documentation Precautions:  Precautions Precautions: Fall Precaution Comments: R>L hemi, R inattention. Poor postural control. Restrictions Weight Bearing Restrictions: No    Pain: denies   Therapy/Group: Individual Therapy  AusLorie Phenix/18/2021, 12:15 PM

## 2020-01-12 NOTE — Progress Notes (Signed)
PHYSICAL MEDICINE & REHABILITATION PROGRESS NOTE   Subjective/Complaints:  Asleep- woke to verbal stimuli Slept OK per nodding head- no issues LBM yesterday.    ROS: cannot determine due to aphasia  Objective:   No results found. Recent Labs    01/10/20 0504  WBC 4.0  HGB 12.3*  HCT 36.4*  PLT 208   Recent Labs    01/10/20 0504  NA 139  K 3.8  CL 109  CO2 22  GLUCOSE 119*  BUN 12  CREATININE 1.04  CALCIUM 9.4    Intake/Output Summary (Last 24 hours) at 01/12/2020 1432 Last data filed at 01/12/2020 1247 Gross per 24 hour  Intake 377 ml  Output --  Net 377 ml        Physical Exam: Vital Signs Blood pressure 122/79, pulse 98, temperature 98 F (36.7 C), temperature source Oral, resp. rate 17, height 5\' 8"  (1.727 m), weight 69.7 kg, SpO2 97 %. Gen: no distress, normal appearing- sitting up in bed- initially asleep, NAD HEENT: oral mucosa pink and moist, NCAT Cardio: borderline tachycardia Chest: CTA B/L- no W/R/R- good air movement Abd: Soft, NT, ND, (+)BS  Musculoskeletal:     Cervical back: Neck supple.     Comments: No edema or tenderness in extremities  Skin:    General: Skin is warm and dry.  Neurological:     Mental Status: He is alert.     Comments: Alert Limited attention and delay in processing.   ?  Expressive aphasia- nodding head yes and no Follows simple commands. Motor: Appears to be 5/5 throughout  Psychiatric:        Mood and Affect: Affect is blunt.        Speech: Speech is delayed and slurred.        Behavior: Behavior is slowed.        Cognition and Memory: Cognition is impaired.    Assessment/Plan: 1. Functional deficits which require 3+ hours per day of interdisciplinary therapy in a comprehensive inpatient rehab setting.  Physiatrist is providing close team supervision and 24 hour management of active medical problems listed below.  Physiatrist and rehab team continue to assess barriers to discharge/monitor  patient progress toward functional and medical goals  Care Tool:  Bathing    Body parts bathed by patient: Chest,Front perineal area,Abdomen,Face,Right arm,Left arm,Right upper leg,Left upper leg   Body parts bathed by helper: Buttocks,Right lower leg,Left lower leg     Bathing assist Assist Level: 2 Helpers (using Stedy for sitting support)     Upper Body Dressing/Undressing Upper body dressing   What is the patient wearing?: Pull over shirt    Upper body assist Assist Level: Maximal Assistance - Patient 25 - 49%    Lower Body Dressing/Undressing Lower body dressing      What is the patient wearing?: Incontinence brief,Pants     Lower body assist Assist for lower body dressing: Maximal Assistance - Patient 25 - 49% (pt able to thread Lt LE into pants in bed given cuing alone)     Toileting Toileting Toileting Activity did not occur (Clothing management and hygiene only): N/A (no void or bm)  Toileting assist Assist for toileting: 2 Helpers     Transfers Chair/bed transfer  Transfers assist  Chair/bed transfer activity did not occur: Safety/medical concerns  Chair/bed transfer assist level: Moderate Assistance - Patient 50 - 74%     Locomotion Ambulation   Ambulation assist      Assist level: 2 helpers  Max distance: 5   Walk 10 feet activity   Assist  Walk 10 feet activity did not occur: Safety/medical concerns        Walk 50 feet activity   Assist Walk 50 feet with 2 turns activity did not occur: Safety/medical concerns         Walk 150 feet activity   Assist Walk 150 feet activity did not occur: Safety/medical concerns         Walk 10 feet on uneven surface  activity   Assist Walk 10 feet on uneven surfaces activity did not occur: Safety/medical concerns         Wheelchair     Assist Will patient use wheelchair at discharge?: Yes Type of Wheelchair: Manual    Wheelchair assist level: Supervision/Verbal  cueing Max wheelchair distance: 147ft    Wheelchair 50 feet with 2 turns activity    Assist        Assist Level: Supervision/Verbal cueing   Wheelchair 150 feet activity     Assist      Assist Level: Supervision/Verbal cueing   Blood pressure 122/79, pulse 98, temperature 98 F (36.7 C), temperature source Oral, resp. rate 17, height 5\' 8"  (1.727 m), weight 69.7 kg, SpO2 97 %.  Medical Problem List and Plan: 1.  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             -patient may shower             -ELOS/Goals: 14-17 days/Min A             Continue CIR 2.  Antithrombotics: -DVT/anticoagulation: Lovenox             -antiplatelet therapy: Aspirin 81 mg daily and Plavix 75 mg daily x3 months then aspirin daily 3. Pain Management: Continue Tylenol as needed 4. Mood: Provide emotional support             -antipsychotic agents: N/A 5. Neuropsych: This patient is not capable of making decisions on his own behalf. 6. Skin/Wound Care: Routine skin checks. May d/c OV 7. Fluids/Electrolytes/Nutrition: Routine and outs.             CMP stable on 12/16 8.  Hypertension.  Well controlled, continue Norvasc 5 mg daily.    12/18- BP controlled- con't regimen             Monitor with increased mobility 9.  Hyperlipidemia.  Lipitor 10.  BPH.  Continue Flomax 0.4 mg daily.               PVRs ordered 11.  Severe sepsis with acute enteritis.  Completed course of azithromycin.  Monitor hydration. 12.  AKI resolved.  Repeat BMP on Monday.     LOS: 3 days A FACE TO FACE EVALUATION WAS PERFORMED  Keiyana Stehr 01/12/2020, 2:32 PM

## 2020-01-12 NOTE — Progress Notes (Signed)
Speech Language Pathology Daily Session Note  Patient Details  Name: Alexander Byrd MRN: [id number] Date of Birth: 06/01/1950  Today's Date: 01/12/2020 SLP Individual Time: 8938-1017 SLP Individual Time Calculation (min): 45 min  Short Term Goals: Week 1: SLP Short Term Goal 1 (Week 1): Pt will consume trials of ice/thin H2O with minimal overt s/sx aspiration X2 prior to repeat MBSS. SLP Short Term Goal 2 (Week 1): Pt will demonstrate efficient masticaiton and oral clearance of Dys 3 textures with no more than Min A cues for swallow strategies prior to upgrade. SLP Short Term Goal 3 (Week 1): Pt will use compensatory strategies for speech intelligibility with Mod A verbal/visual cues. SLP Short Term Goal 4 (Week 1): Pt will demonstrate initiate request to communicate his wants and needs with Mod A multimodal cues. SLP Short Term Goal 5 (Week 1): Pt will sustain attention to tasks with Mod A for 5-7 minute intervals. SLP Short Term Goal 5 - Progress (Week 1): Progressing toward goal SLP Short Term Goal 6 (Week 1): Pt will recall new/daily information with Mod A for use of strategies/aids.  Skilled Therapeutic Interventions:   Patient seen by skilled ST to focus on dysphagia and cognitive-linguistic goals. Patient able to respond to greetings and basic needs questions, generally with 1-2 word responses. Patient spoke with someone on phone as it rang during session, and he appeared to respond appropriately "doing alright", "love you", etc. SLP noted that breakfast tray had not been touched yet and patient was agreeable to eat breakfast. Initially, he said he didn't want what was on tray (sausage, ground, and scrambled eggs) but when asked what he would like, he said "eggs and bacon". Patient able to self feed after SLP setup and although he did pick up drink that was on right side, he did seem to neglect a little of food on right side of plate. He showed some awareness to when he dropped food on  shirt and picked it up with fork. Patient did not exhibit any overt s/s of difficulty with oral phase of swallow and no oral residuals after he performed oral care (setup assistance from SLP). Overall, he appears to be tolerating current diet well. Patient continues to benefit from skilled SLP intervention to maximize swallow, speech, language and cognitive function goals prior to discharge.  Pain Pain Assessment Pain Scale: Faces Faces Pain Scale: No hurt  Therapy/Group: Individual Therapy  Angela Nevin, MA, CCC-SLP Speech Therapy

## 2020-01-12 NOTE — Progress Notes (Signed)
Occupational Therapy Session Note  Patient Details  Name: Alexander Byrd MRN: [id number] Date of Birth: 1950/05/25  Today's Date: 01/12/2020 OT Individual Time: 1400-1500 OT Individual Time Calculation (min): 60 min    Short Term Goals: Week 1:  OT Short Term Goal 1 (Week 1): Pt will sit to EOB with mod A. OT Short Term Goal 2 (Week 1): Pt will maintain static sit at EOB with min A to engage in self care. OT Short Term Goal 3 (Week 1): Pt will be able to bathe both arms with CGA. OT Short Term Goal 4 (Week 1): Pt will transfer to [facility] with max A.  Skilled Therapeutic Interventions/Progress Updates:    1:1. Upon entering room, pt reporting needing a brief change and no pain. Pt incontinent of bo wel in brief and reporting not needing to urinate. Pt rolls in B directions with MIN A overall during posterior hygeine, however begins to urinate when placing new brief and it spills into bed, onto shirt and chuck. Pt rolls again as more bowel incontinence occurs during urine void. After peri care and butock hygiene pt sits EOB with up to MOD A for sitting balance and tactile cues for trunk lengthening and VC for head/trunk control to decrease post/L lean. Pt dons shirt with MAX HOH A for large coordinated movements d/t ataxia. Pt completes requires MAX HOH A to threadd BLE into pants seated EOB with guiding A for trunk control. Pt sit to stand in stedy with min-mod A throughout session and MOD A for standing balance during clothing management. In w/c, pt completes seated reaching, visual scanning, visual pursuit and tracing task at [employer]. Pt demo ataxia, limited shoulder ROM and poor coordination in BUE. LUE reaction time 7.6 seconds RUE 4.6 seconds. Pt completes 2x10 ball toss to improve coordination while seated in w/c. Exited session with pt seated in bed, exit alarm on and call light in reach  Therapy Documentation Precautions:  Precautions Precautions: Fall Precaution Comments: R>L  hemi, R inattention. Poor postural control. Restrictions Weight Bearing Restrictions: No General:   Vital Signs:   Pain:   ADL: ADL Eating: Minimal assistance Grooming: Moderate assistance Upper Body Bathing: Moderate assistance Where Assessed-Upper Body Bathing: Bed level Lower Body Bathing: Maximal assistance Where Assessed-Lower Body Bathing: Bed level Upper Body Dressing: Moderate assistance Where Assessed-Upper Body Dressing: Bed level Lower Body Dressing: Maximal assistance Where Assessed-Lower Body Dressing: Bed level Toilet Transfer: Not assessed Vision   Perception    Praxis   Exercises:   Other Treatments:     Therapy/Group: Individual Therapy  Shon Hale 01/12/2020, 2:00 PM

## 2020-01-13 LAB — URINALYSIS, ROUTINE W REFLEX MICROSCOPIC
Bacteria, UA: NONE SEEN
Bilirubin Urine: NEGATIVE
Glucose, UA: NEGATIVE mg/dL
Ketones, ur: NEGATIVE mg/dL
Leukocytes,Ua: NEGATIVE
Nitrite: NEGATIVE
Protein, ur: 100 mg/dL — AB
RBC / HPF: >50 RBC/hpf — ABNORMAL HIGH (ref 0–5)
Specific Gravity, Urine: 1.026 (ref 1.005–1.030)
pH: 5 (ref 5.0–8.0)

## 2020-01-13 MED ORDER — SODIUM CHLORIDE 0.9 % IV SOLN: INTRAVENOUS | Status: DC

## 2020-01-13 NOTE — Progress Notes (Signed)
No void for 8 hours, bladder scan and intermittent cath administered. Patient tolerated well. Intake less than 1000 ml's yesterday.

## 2020-01-13 NOTE — Progress Notes (Signed)
Patient flat effect, despondent. Encouraged to express needs. Asked if he was feeling sad, nodded yes. Attempted to reassure/comfort.

## 2020-01-13 NOTE — Progress Notes (Signed)
Occupational Therapy Session Note  Patient Details  Name: Alexander Byrd MRN: [id number] Date of Birth: 16-Aug-1950  Today's Date: 01/13/2020 OT Group Time: 1115-1200 OT Group Time Calculation (min): 45 min  Skilled Therapeutic Interventions/Progress Updates:    Pt engaged in therapeutic w/c level dance group focusing on patient choice, UE/LE strengthening, salience, activity tolerance, and social participation. Pt was guided through various dance-based exercises involving UEs/LEs and trunk. All music was selected by group members. Emphasis placed on NMR, Rt attention, and motor planning. Pt did well with following guided instruction regarding seated exercises, unable to verbalize his name for group but did give the thumbs up occasionally. Pt observing group members on Rt>Lt sides, mirroring their movements and incorporating bilateral UEs/LEs as appropriate. At end of session, NT returned pt to the room.   Therapy Documentation Precautions:  Precautions Precautions: Fall Precaution Comments: R>L hemi, R inattention. Poor postural control. Restrictions Weight Bearing Restrictions: No Vital Signs: Therapy Vitals Temp: 98.2 F (36.8 C) Pulse Rate: 96 Resp: 16 BP: 127/79 Patient Position (if appropriate): Lying Oxygen Therapy SpO2: 97 % O2 Device: Room Air Pain: no s/s pain during tx   ADL: ADL Eating: Minimal assistance Grooming: Moderate assistance Upper Body Bathing: Moderate assistance Where Assessed-Upper Body Bathing: Bed level Lower Body Bathing: Maximal assistance Where Assessed-Lower Body Bathing: Bed level Upper Body Dressing: Moderate assistance Where Assessed-Upper Body Dressing: Bed level Lower Body Dressing: Maximal assistance Where Assessed-Lower Body Dressing: Bed level Toilet Transfer: Not assessed      Therapy/Group: Group Therapy  [name] A Reeta Kuk 01/13/2020, 4:07 PM

## 2020-01-14 ENCOUNTER — Inpatient Hospital Stay (HOSPITAL_COMMUNITY): Payer: Medicare Other | Admitting: Physical Therapy

## 2020-01-14 ENCOUNTER — Inpatient Hospital Stay (HOSPITAL_COMMUNITY): Payer: Medicare Other

## 2020-01-14 ENCOUNTER — Inpatient Hospital Stay (HOSPITAL_COMMUNITY): Payer: Medicare Other | Admitting: Occupational Therapy

## 2020-01-14 ENCOUNTER — Encounter (HOSPITAL_COMMUNITY): Payer: Medicare Other | Admitting: Speech Pathology

## 2020-01-14 LAB — CBC
HCT: 35.1 % — ABNORMAL LOW (ref 39.0–52.0)
Hemoglobin: 11.6 g/dL — ABNORMAL LOW (ref 13.0–17.0)
MCH: 30.1 pg (ref 26.0–34.0)
MCHC: 33 g/dL (ref 30.0–36.0)
MCV: 90.9 fL (ref 80.0–100.0)
Platelets: 186 10*3/uL (ref 150–400)
RBC: 3.86 MIL/uL — ABNORMAL LOW (ref 4.22–5.81)
RDW: 13.2 % (ref 11.5–15.5)
WBC: 4.2 10*3/uL (ref 4.0–10.5)
nRBC: 0 % (ref 0.0–0.2)

## 2020-01-14 LAB — BASIC METABOLIC PANEL
Anion gap: 8 (ref 5–15)
BUN: 15 mg/dL (ref 8–23)
CO2: 23 mmol/L (ref 22–32)
Calcium: 9.1 mg/dL (ref 8.9–10.3)
Chloride: 109 mmol/L (ref 98–111)
Creatinine, Ser: 1.08 mg/dL (ref 0.61–1.24)
GFR, Estimated: >60 mL/min (ref >60)
Glucose, Bld: 116 mg/dL — ABNORMAL HIGH (ref 70–99)
Potassium: 4.2 mmol/L (ref 3.5–5.1)
Sodium: 140 mmol/L (ref 135–145)

## 2020-01-14 MED ORDER — SODIUM CHLORIDE 0.9 % IV SOLN: INTRAVENOUS | Status: DC

## 2020-01-14 NOTE — Progress Notes (Signed)
Modified Barium Swallow Progress Note  Patient Details  Name: Alexander Byrd MRN: [id number] Date of Birth: Jan 31, 1950  Today's Date: 01/14/2020  Modified Barium Swallow completed.  Full report located under Chart Review in the Imaging Section.  Brief recommendations include the following:  Clinical Impression   Pt presents with mild oropharyngeal dysphagia characterized by oral and facial weakness and decreased control of liquid boluses leading to premature spillage into the pharynx and intermittent deep penetration. Puree and honey thick barium resulted in lingual pumping and mildly delayed oral transit with swallow initiation occurring at the vallecular sinuses. Self-fed cup sips of nectar resulted in premature spillage into the pyriform sinuses, where it remained for 2-4 seconds prior to swallow initiation and resulted in penetration to the vocal folds followed by questionable trace aspiration X1 (PAS score 5 vs 6). When nectar boluses were delivered to pt via tsp, swallow initiation occurred in a more timely manner in >75% of instances (at the level of the vallecula) in the absence of airway intrusion. Thin barium trials resulted in a similar presentation to nectar via cup - a significantly delayed swallow initiation with penetration into the vestibule (PAS score 5). Pt did demonstrate ability to consume a barium pill whole in applesauce with only a little extra time for oral transit. Recommend pt continue with Dysphagia 2 (minced/ground) solid textures, upgrade to nectar thick liquids by tsp only, pills may be given whole in applesauce 1 at a time. Pt will still required full supervision during intake to ensure use of safe and compensatory swallow strategies. ST will continue to provide skilled interventions to help pt work toward diet advancement and increased independence with use of compensatory swallow strategies while he remains inpatient.    Swallow Evaluation Recommendations      SLP  Diet Recommendations: Dysphagia 2 (Fine chop) solids;Nectar thick liquid   Liquid Administration via: Spoon   Medication Administration: Whole meds with puree   Supervision: Patient able to self feed;Staff to assist with self feeding;Full supervision/cueing for compensatory strategies   Compensations: Minimize environmental distractions;Slow rate;Small sips/bites;Other (Comment) (use spoon for delivery of nectar liquids)   Postural Changes: Remain semi-upright after after feeds/meals (Comment);Seated upright at 90 degrees   Oral Care Recommendations: Oral care BID   Other Recommendations: Order thickener from pharmacy;Prohibited food (jello, ice cream, thin soups);Remove water pitcher;Have oral suction available    [name] 01/14/2020,10:38 AM

## 2020-01-14 NOTE — Progress Notes (Signed)
Occupational Therapy Session Note  Patient Details  Name: Alexander Byrd MRN: [id number] Date of Birth: December 30, 1950  Today's Date: 01/14/2020 OT Individual Time: 1300-1330 OT Individual Time Calculation (min): 30 min    Short Term Goals: Week 1:  OT Short Term Goal 1 (Week 1): Pt will sit to EOB with mod A. OT Short Term Goal 2 (Week 1): Pt will maintain static sit at EOB with min A to engage in self care. OT Short Term Goal 3 (Week 1): Pt will be able to bathe both arms with CGA. OT Short Term Goal 4 (Week 1): Pt will transfer to [facility] with max A.  Skilled Therapeutic Interventions/Progress Updates:    Patient in bed, alert, cooperative, he denies pain but states that he is tired.  Supine to sitting edge of bed with mod A.  Completed trunk control and upright posture activities with focus on midline, proximal control, scapular positioning, pelvic mobility, reaching.  He required a brief rest break after 15 minutes, returned to lying down with maxA.  Completed bilateral UE AROM activities and trunk rotation and extension exercises in supine position.  Returned to sitting with mod A, ongoing trunk control exercises.  He returned to supine at close of session.  Bed alarm set and call bell in reach.    Therapy Documentation Precautions:  Precautions Precautions: Fall Precaution Comments: R>L hemi, R inattention. Poor postural control. Restrictions Weight Bearing Restrictions: No   Therapy/Group: Individual Therapy  Barrie Lyme 01/14/2020, 7:55 AM

## 2020-01-14 NOTE — Progress Notes (Signed)
[place] PHYSICAL MEDICINE & REHABILITATION PROGRESS NOTE   Subjective/Complaints: No complaints this morning Hard of hearing Participating with SLP Attempted to update sister but her voicemail box has not been set up.   ROS: Denies pain  Objective:   DG Swallowing Func-Speech Pathology  Result Date: 01/14/2020 Objective Swallowing Evaluation: Type of Study: MBS-Modified Barium Swallow Study  Patient Details Name: Eleftherios Dudenhoeffer MRN: [id number] Date of Birth: 10/23/1950 Today's Date: 01/14/2020 Past Medical History: No past medical history on file. Past Surgical History: No past surgical history on file. HPI: Pt is a 69 y.o. 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 More confusion per family report as well. Patient is altered on arrival and unable to write any verbal history.  He is accompanied by his sister who states that typically his able to speak and only has weakness in his left side.  Unsure of pt's Baseline Cognitive-communication status or functional status for safe po intake, self-feeding as there were no notes in the chart in Care Everywhere from his recent CVA at "[facility]" ~3 weeks ago. Pt admitted to [facility] CIR 01/09/20.  Assessment / Plan / Recommendation CHL IP CLINICAL IMPRESSIONS 01/14/2020 Clinical Impression --Pt presents with mild oropharyngeal dysphagia characterized by oral and facial weakness and decreased control of liquid boluses leading to premature spillage into the pharynx and intermittent deep penetration. Puree and honey thick barium resulted in lingual pumping and mildly delayed oral transit with swallow initiation occurring at the vallecular sinuses. Self-fed cup sips of nectar resulted in premature spillage into the pyriform sinuses, where  it remained for 2-4 seconds prior to swallow initiation and resulted in penetration to the vocal folds followed by questionable trace aspiration X1 (PAS score 5 vs 6). When nectar boluses were delivered to pt via tsp, swallow initiation occurred in a more timely manner in >75% of instances (at the level of the vallecula) in the absence of airway intrusion. Thin barium trials resulted in a similar presentation to nectar via cup - a significantly delayed swallow initiation with penetration into the vestibule (PAS score 5). Pt did demonstrate ability to consume a barium pill whole in applesauce with only a little extra time for oral transit. Recommend pt continue with Dysphagia 2 (minced/ground) solid textures, upgrade to nectar thick liquids by tsp only, pills may be given whole in applesauce 1 at a time. Pt will still required full supervision during intake to ensure use of safe and compensatory swallow strategies. ST will continue to provide skilled interventions to help pt work toward diet advancement and increased independence with use of compensatory swallow strategies while he remains inpatient.  SLP Visit Diagnosis Dysphagia, oropharyngeal phase (R13.12) Attention and concentration deficit following -- Frontal lobe and executive function deficit following -- Impact on safety and function Mild aspiration risk   CHL IP TREATMENT RECOMMENDATION 01/05/2020 Treatment Recommendations Therapy as outlined in treatment plan below   Prognosis 01/05/2020 Prognosis for Safe Diet Advancement Fair Barriers to Reach Goals Cognitive deficits;Time post onset;Severity of deficits;Language deficits;Behavior Barriers/Prognosis Comment -- CHL IP DIET RECOMMENDATION 01/14/2020 SLP Diet Recommendations Dysphagia 2 (Fine chop) solids;Nectar thick liquid Liquid Administration via Spoon Medication Administration Whole meds with puree Compensations Minimize environmental distractions;Slow rate;Small sips/bites;Other (Comment) Postural  Changes Remain semi-upright after after feeds/meals (  Comment);Seated upright at 90 degrees   CHL IP OTHER RECOMMENDATIONS 01/14/2020 Recommended Consults -- Oral Care Recommendations Oral care BID Other Recommendations Order thickener from pharmacy;Prohibited food (jello, ice cream, thin soups);Remove water pitcher;Have oral suction available   CHL IP FOLLOW UP RECOMMENDATIONS 01/05/2020 Follow up Recommendations Skilled Nursing facility   [facility] IP FREQUENCY AND DURATION 01/05/2020 Speech Therapy Frequency (ACUTE ONLY) min 3x week Treatment Duration 2 weeks      CHL IP ORAL PHASE 01/14/2020 Oral Phase Impaired Oral - Pudding Teaspoon -- Oral - Pudding Cup -- Oral - Honey Teaspoon -- Oral - Honey Cup Lingual pumping;Delayed oral transit;Lingual/palatal residue Oral - Nectar Teaspoon Premature spillage Oral - Nectar Cup Premature spillage;Decreased bolus cohesion Oral - Nectar Straw -- Oral - Thin Teaspoon -- Oral - Thin Cup Premature spillage;Decreased bolus cohesion Oral - Thin Straw -- Oral - Puree Lingual pumping Oral - Mech Soft -- Oral - Regular -- Oral - Multi-Consistency -- Oral - Pill Delayed oral transit Oral Phase - Comment --  CHL IP PHARYNGEAL PHASE 01/14/2020 Pharyngeal Phase Impaired Pharyngeal- Pudding Teaspoon -- Pharyngeal -- Pharyngeal- Pudding Cup -- Pharyngeal -- Pharyngeal- Honey Teaspoon -- Pharyngeal -- Pharyngeal- Honey Cup Delayed swallow initiation-vallecula Pharyngeal -- Pharyngeal- Nectar Teaspoon Delayed swallow initiation-vallecula;Delayed swallow initiation-pyriform sinuses Pharyngeal -- Pharyngeal- Nectar Cup Delayed swallow initiation-vallecula;Delayed swallow initiation-pyriform sinuses;Penetration/Aspiration during swallow Pharyngeal Material enters airway, passes BELOW cords then ejected out;Material enters airway, CONTACTS cords and not ejected out Pharyngeal- Nectar Straw -- Pharyngeal -- Pharyngeal- Thin Teaspoon -- Pharyngeal -- Pharyngeal- Thin Cup Penetration/Aspiration during  swallow;Delayed swallow initiation-pyriform sinuses Pharyngeal Material enters airway, CONTACTS cords and not ejected out Pharyngeal- Thin Straw -- Pharyngeal -- Pharyngeal- Puree Delayed swallow initiation-vallecula Pharyngeal -- Pharyngeal- Mechanical Soft -- Pharyngeal -- Pharyngeal- Regular -- Pharyngeal -- Pharyngeal- Multi-consistency -- Pharyngeal -- Pharyngeal- Pill WFL Pharyngeal -- Pharyngeal Comment --  CHL IP CERVICAL ESOPHAGEAL PHASE 01/14/2020 Cervical Esophageal Phase WFL Pudding Teaspoon -- Pudding Cup -- Honey Teaspoon -- Honey Cup -- Nectar Teaspoon -- Nectar Cup -- Nectar Straw -- Thin Teaspoon -- Thin Cup -- Thin Straw -- Puree -- Mechanical Soft -- Regular -- Multi-consistency -- Pill -- Cervical Esophageal Comment -- [name] 01/14/2020, 10:41 AM              Recent Labs    01/14/20 0754  WBC 4.2  HGB 11.6*  HCT 35.1*  PLT 186   Recent Labs    01/14/20 0754  NA 140  K 4.2  CL 109  CO2 23  GLUCOSE 116*  BUN 15  CREATININE 1.08  CALCIUM 9.1    Intake/Output Summary (Last 24 hours) at 01/14/2020 1145 Last data filed at 01/14/2020 1105 Gross per 24 hour  Intake 1088.32 ml  Output 880 ml  Net 208.32 ml        Physical Exam: Vital Signs Blood pressure 120/75, pulse 92, temperature 98.5 F (36.9 C), temperature source Oral, resp. rate 17, height 5\' 8"  (1.727 m), weight 69.7 kg, SpO2 97 %. Gen: no distress, normal appearing HEENT: oral mucosa pink and moist, NCAT Cardio: Reg rate Chest: normal effort, normal rate of breathing Abd: soft, non-distended Ext: no edema Skin: intact Musculoskeletal:     Cervical back: Neck supple.     Comments: No edema or tenderness in extremities  Skin:    General: Skin is warm and dry.  Neurological:     Mental Status: He is alert.     Comments: Alert Limited attention and delay in  processing.   ?  Expressive aphasia- nodding head yes and no Follows simple commands. Motor: Appears to be 5/5 throughout   Psychiatric:        Mood and Affect: Affect is blunt.        Speech: Speech is delayed and slurred.        Behavior: Behavior is slowed.        Cognition and Memory: Cognition is impaired.    Assessment/Plan: 1. Functional deficits which require 3+ hours per day of interdisciplinary therapy in a comprehensive inpatient rehab setting.  Physiatrist is providing close team supervision and 24 hour management of active medical problems listed below.  Physiatrist and rehab team continue to assess barriers to discharge/monitor patient progress toward functional and medical goals  Care Tool:  Bathing    Body parts bathed by patient: Chest,Front perineal area,Abdomen,Face,Right arm,Left arm,Right upper leg,Left upper leg   Body parts bathed by helper: Buttocks,Right lower leg,Left lower leg     Bathing assist Assist Level: 2 Helpers (using Stedy for sitting support)     Upper Body Dressing/Undressing Upper body dressing   What is the patient wearing?: Pull over shirt    Upper body assist Assist Level: Maximal Assistance - Patient 25 - 49%    Lower Body Dressing/Undressing Lower body dressing      What is the patient wearing?: Incontinence brief,Pants     Lower body assist Assist for lower body dressing: Maximal Assistance - Patient 25 - 49% (pt able to thread Lt LE into pants in bed given cuing alone)     Toileting Toileting Toileting Activity did not occur (Clothing management and hygiene only): N/A (no void or bm)  Toileting assist Assist for toileting: 2 Helpers     Transfers Chair/bed transfer  Transfers assist  Chair/bed transfer activity did not occur: Safety/medical concerns  Chair/bed transfer assist level: Moderate Assistance - Patient 50 - 74%     Locomotion Ambulation   Ambulation assist      Assist level: 2 helpers   Max distance: 5   Walk 10 feet activity   Assist  Walk 10 feet activity did not occur: Safety/medical concerns         Walk 50 feet activity   Assist Walk 50 feet with 2 turns activity did not occur: Safety/medical concerns         Walk 150 feet activity   Assist Walk 150 feet activity did not occur: Safety/medical concerns         Walk 10 feet on uneven surface  activity   Assist Walk 10 feet on uneven surfaces activity did not occur: Safety/medical concerns         Wheelchair     Assist Will patient use wheelchair at discharge?: Yes Type of Wheelchair: Manual    Wheelchair assist level: Supervision/Verbal cueing Max wheelchair distance: 13150ft    Wheelchair 50 feet with 2 turns activity    Assist        Assist Level: Supervision/Verbal cueing   Wheelchair 150 feet activity     Assist      Assist Level: Supervision/Verbal cueing   Blood pressure 120/75, pulse 92, temperature 98.5 F (36.9 C), temperature source Oral, resp. rate 17, height 5\' 8"  (1.727 m), weight 69.7 kg, SpO2 97 %.  Medical Problem List and Plan: 1.  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             -  patient may shower             -ELOS/Goals: 14-17 days/Min A             Continue CIR 2.  Antithrombotics: -DVT/anticoagulation: Lovenox             -antiplatelet therapy: Aspirin 81 mg daily and Plavix 75 mg daily x3 months then aspirin daily 3. Pain Management: Continue Tylenol as needed 4. Mood: Provide emotional support             -antipsychotic agents: N/A 5. Neuropsych: This patient is not capable of making decisions on his own behalf. 6. Skin/Wound Care: Routine skin checks. May d/c OV 7. Fluids/Electrolytes/Nutrition: Routine and outs.             12/20 electrolytes stable- repeat in 1 week.  8.  Hypertension.  Well controlled, continue Norvasc 5 mg daily.    12/18- BP controlled- con't regimen             Monitor with increased mobility 9.  Hyperlipidemia.  Lipitor 10.  BPH.  Continue  Flomax 0.4 mg daily.               PVRs ordered 11.  Severe sepsis with acute enteritis.  Completed course of azithromycin.  Monitor hydration. 12.  AKI: resolved. Repeat Cr in 1 week.  13. Dysphagia: D2 nectar thick, MBS 12/20.     LOS: 5 days A FACE TO FACE EVALUATION WAS PERFORMED  Clint Bolder P Agastya Meister 01/14/2020, 11:45 AM

## 2020-01-14 NOTE — Progress Notes (Signed)
Occupational Therapy Session Note  Patient Details  Name: Alexander Byrd MRN: [id number] Date of Birth: Dec 20, 1950  Today's Date: 01/14/2020 OT Individual Time: 1100-1200 OT Individual Time Calculation (min): 60 min    Short Term Goals: Week 1:  OT Short Term Goal 1 (Week 1): Pt will sit to EOB with mod A. OT Short Term Goal 2 (Week 1): Pt will maintain static sit at EOB with min A to engage in self care. OT Short Term Goal 3 (Week 1): Pt will be able to bathe both arms with CGA. OT Short Term Goal 4 (Week 1): Pt will transfer to [facility] with max A.  Skilled Therapeutic Interventions/Progress Updates:    Pt received in bed and agreeable to therapy. Session delayed 15 min due to nursing care to cath pt.  He did receive full 60 min of therapy.  Worked on rolling and sidelying to sit techniques to have pt utilize all 4 extremities and core with min to mod A.   Once sitting EOB, pt leaned to L. Spent 10 min or more working on finding midline and holding. This was very challenging to pt has he has very weak R torso strength and limited active back extension.   Mod A to sq pivot to wc.  In wc he continued to lean to R and flexed posture. Placed pt at sink to work on forward lean and sitting upright with oral care and UB bathing.  He continued to lean so placed wedge under L side of cushion to tilt pelvis to help with alignment. This did help.  Pt then donned shirt once set up and cued to put his arms in first with only min A.  Worked on neck rotation to R, visual scanning and reaching to the R.  Pt did participate well.    Resting in wc with belt alarm on and all needs met.  Tray table set up to prepare for lunch.  Therapy Documentation Precautions:  Precautions Precautions: Fall Precaution Comments: R>L hemi, R inattention. Poor postural control. Restrictions Weight Bearing Restrictions: No       Pain: Pain Assessment Pain Scale: 0-10 Pain Score: 0-No pain ADL: ADL Eating:  Minimal assistance Grooming: Moderate assistance Upper Body Bathing: Moderate assistance Where Assessed-Upper Body Bathing: Bed level Lower Body Bathing: Maximal assistance Where Assessed-Lower Body Bathing: Bed level Upper Body Dressing: Moderate assistance Where Assessed-Upper Body Dressing: Bed level Lower Body Dressing: Maximal assistance Where Assessed-Lower Body Dressing: Bed level Toilet Transfer: Not assessed   Therapy/Group: Individual Therapy  [place] 01/14/2020, 9:04 AM

## 2020-01-14 NOTE — Progress Notes (Signed)
Physical Therapy Session Note  Patient Details  Name: Alexander Byrd MRN: [id number] Date of Birth: Jan 24, 1951  Today's Date: 01/14/2020 PT Individual Time: 1405-1500 PT Individual Time Calculation (min): 55 min   Short Term Goals: Week 1:  PT Short Term Goal 1 (Week 1): Pt will perform swing pivot transfer with min assist PT Short Term Goal 2 (Week 1): Pt will perform bed mobility with CGA PT Short Term Goal 3 (Week 1): Pt will ambulate with max assist of 1 and LRAD PT Short Term Goal 4 (Week 1): pt will demonstrate upright posture at bedside and maintain with dynamic movements  Skilled Therapeutic Interventions/Progress Updates: Pt presented in bed agreeable to therapy. Pt denies pain at start of session. Performed supine to sit with modA and use of bed feautres. Performed squat pivot to R modA to w/c. Pt transporter  to rehab gym and performed squat pivot to R modA. Pt then participated in sitting balance with mirror feedback to maintain midline orientation. Participated in reaching using LUE and crossing midline to place horseshoes on basketball hoop x8. Pt required max cues to return to midline and maintain vs leaning to L. Pt performed same activity with RUE requiring HOH assist to reach hoop then encouraging R lean to hand horseshoe to PTA. Pt then performed squat pivot to L with maxA and pt with significant forward lean with head almost touching w/c cushion. Pt transported to day room and participated in Cybex Kinetron 80cm/sec x 3 min for reciprocal activity and general conditioning. Verbal cues to push through end range on RLE with fair carryover. Pt then transported back to room and performed squat pivot to R to bed with modA. Pt required modA sit to supine with use of bed rail. Pt repositioned to comfort and left with bed alarm on, call bell within reach and needs met.      Therapy Documentation Precautions:  Precautions Precautions: Fall Precaution Comments: R>L hemi, R  inattention. Poor postural control. Restrictions Weight Bearing Restrictions: No General:   Vital Signs:   Pain: Pain Assessment Pain Score: 0-No pain   Therapy/Group: Individual Therapy  Karlen Barbar  Jaquae Rieves, PTA  01/14/2020, 4:02 PM

## 2020-01-14 NOTE — Progress Notes (Signed)
Patient's sister V is requesting a call from a provider for an update. [phone]

## 2020-01-14 NOTE — Progress Notes (Signed)
Speech Language Pathology Daily Session Note  Patient Details  Name: Alexander Byrd MRN: [id number] Date of Birth: 1950/07/01  Today's Date: 01/14/2020 SLP Individual Time: 0830-0900 SLP Individual Time Calculation (min): 30 min  Short Term Goals: Week 1: SLP Short Term Goal 1 (Week 1): Pt will consume trials of ice/thin H2O with minimal overt s/sx aspiration X2 prior to repeat MBSS. SLP Short Term Goal 2 (Week 1): Pt will demonstrate efficient masticaiton and oral clearance of Dys 3 textures with no more than Min A cues for swallow strategies prior to upgrade. SLP Short Term Goal 3 (Week 1): Pt will use compensatory strategies for speech intelligibility with Mod A verbal/visual cues. SLP Short Term Goal 4 (Week 1): Pt will demonstrate initiate request to communicate his wants and needs with Mod A multimodal cues. SLP Short Term Goal 5 (Week 1): Pt will sustain attention to tasks with Mod A for 5-7 minute intervals. SLP Short Term Goal 5 - Progress (Week 1): Progressing toward goal SLP Short Term Goal 6 (Week 1): Pt will recall new/daily information with Mod A for use of strategies/aids.  Skilled Therapeutic Interventions:Skilled ST services focused on communication and swallow skills. NT handed off supervision meal to SLP. SLP reposioned pt for optimal PO consumption in bed while consuming dys 2 textures and honey thick liquid breakfast tray. Pt demonstrated ability to self-feed and mod I use of lingual/finger sweeps to clear mild oral residue. Pt initially demonstrated reduced verbal output, only occasional responding to verbal request. However after SLP placed hearing amplifiers on pt, he was able to respond to 100% of verbal request with only a mild delay. Pt was able to produce 5-6 word sentences to describe picture scenes. SLP recommends utilizing hearing ampilifer during all interactions, due to pt likely primarly being hard of hearing versus language deficit. SLP will continue assesment  of language skills in next treatment session. Pt was left in room with call bell within reach and bed alarm set. SLP recommends to continue skilled services.     Pain    Therapy/Group: Individual Therapy  [name]  [facility] 01/14/2020, 8:09 AM

## 2020-01-15 ENCOUNTER — Inpatient Hospital Stay (HOSPITAL_COMMUNITY): Payer: Medicare Other

## 2020-01-15 ENCOUNTER — Inpatient Hospital Stay (HOSPITAL_COMMUNITY): Payer: Medicare Other | Admitting: Speech Pathology

## 2020-01-15 ENCOUNTER — Inpatient Hospital Stay (HOSPITAL_COMMUNITY): Payer: Medicare Other | Admitting: Physical Therapy

## 2020-01-15 ENCOUNTER — Inpatient Hospital Stay (HOSPITAL_COMMUNITY): Payer: Medicare Other | Admitting: Occupational Therapy

## 2020-01-15 NOTE — Progress Notes (Signed)
Pt wore prevalon boots most of night from 2200-0400 before he refused the boots.

## 2020-01-15 NOTE — Plan of Care (Signed)
Patient voided on his own today and scanned for <300 cc post void, continue monitoring spontaneous voiding and need for I+O catheterization

## 2020-01-15 NOTE — Progress Notes (Signed)
[place] PHYSICAL MEDICINE & REHABILITATION PROGRESS NOTE   Subjective/Complaints: No complaints this morning. Somnolent, but arousable   ROS: Denies pain  Objective:   DG Swallowing Func-Speech Pathology  Result Date: 01/14/2020 Objective Swallowing Evaluation: Type of Study: MBS-Modified Barium Swallow Study  Patient Details Name: Alexander Byrd MRN: [id number] Date of Birth: [birth year] Today's Date: 01/14/2020 Past Medical History: No past medical history on file. Past Surgical History: No past surgical history on file. HPI: Pt is a 69 y.o. 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 More confusion per family report as well. Patient is altered on arrival and unable to write any verbal history.  He is accompanied by his sister who states that typically his able to speak and only has weakness in his left side.  Unsure of pt's Baseline Cognitive-communication status or functional status for safe po intake, self-feeding as there were no notes in the chart in Care Everywhere from his recent CVA at "[facility]" ~3 weeks ago. Pt admitted to [facility] 01/09/20.  Assessment / Plan / Recommendation CHL IP CLINICAL IMPRESSIONS 01/14/2020 Clinical Impression --Pt presents with mild oropharyngeal dysphagia characterized by oral and facial weakness and decreased control of liquid boluses leading to premature spillage into the pharynx and intermittent deep penetration. Puree and honey thick barium resulted in lingual pumping and mildly delayed oral transit with swallow initiation occurring at the vallecular sinuses. Self-fed cup sips of nectar resulted in premature spillage into the pyriform sinuses, where it remained for 2-4 seconds prior to swallow initiation and resulted in penetration  to the vocal folds followed by questionable trace aspiration X1 (PAS score 5 vs 6). When nectar boluses were delivered to pt via tsp, swallow initiation occurred in a more timely manner in >75% of instances (at the level of the vallecula) in the absence of airway intrusion. Thin barium trials resulted in a similar presentation to nectar via cup - a significantly delayed swallow initiation with penetration into the vestibule (PAS score 5). Pt did demonstrate ability to consume a barium pill whole in applesauce with only a little extra time for oral transit. Recommend pt continue with Dysphagia 2 (minced/ground) solid textures, upgrade to nectar thick liquids by tsp only, pills may be given whole in applesauce 1 at a time. Pt will still required full supervision during intake to ensure use of safe and compensatory swallow strategies. ST will continue to provide skilled interventions to help pt work toward diet advancement and increased independence with use of compensatory swallow strategies while he remains inpatient.  SLP Visit Diagnosis Dysphagia, oropharyngeal phase (R13.12) Attention and concentration deficit following -- Frontal lobe and executive function deficit following -- Impact on safety and function Mild aspiration risk   CHL IP TREATMENT RECOMMENDATION 01/05/2020 Treatment Recommendations Therapy as outlined in treatment plan below   Prognosis 01/05/2020 Prognosis for Safe Diet Advancement Fair Barriers to Reach Goals Cognitive deficits;Time post onset;Severity of deficits;Language deficits;Behavior Barriers/Prognosis Comment -- CHL IP DIET RECOMMENDATION 01/14/2020 SLP Diet Recommendations Dysphagia 2 (Fine chop) solids;Nectar thick liquid Liquid Administration via Spoon Medication Administration Whole meds with puree Compensations Minimize environmental distractions;Slow rate;Small sips/bites;Other (Comment) Postural Changes Remain semi-upright after after feeds/meals (Comment);Seated upright at 90  degrees   CHL IP OTHER RECOMMENDATIONS 01/14/2020 Recommended Consults -- Oral  Care Recommendations Oral care BID Other Recommendations Order thickener from pharmacy;Prohibited food (jello, ice cream, thin soups);Remove water pitcher;Have oral suction available   CHL IP FOLLOW UP RECOMMENDATIONS 01/05/2020 [facility] D[facility]2020 Speech Therapy Frequency (ACUTE ONLY) min 3x week Treatment Duration 2 weeks      CHL IP ORAL PHASE 01/14/2020 Oral Phase Impaired Oral - Pudding Teaspoon -- Oral - Pudding Cup -- Oral - Honey Teaspoon -- Oral - Honey Cup Lingual pumping;Delayed oral transit;Lingual/palatal residue Oral - Nectar Teaspoon Premature spillage Oral - Nectar Cup Premature spillage;Decreased bolus cohesion Oral - Nectar Straw -- Oral - Thin Teaspoon -- Oral - Thin Cup Premature spillage;Decreased bolus cohesion Oral - Thin Straw -- Oral - Puree Lingual pumping Oral - Mech Soft -- Oral - Regular -- Oral - Multi-Consistency -- Oral - Pill Delayed oral transit Oral Phase - Comment --  CHL IP PHARYNGEAL PHASE 01/14/2020 Pharyngeal Phase Impaired Pharyngeal- Pudding Teaspoon -- Pharyngeal -- Pharyngeal- Pudding Cup -- Pharyngeal -- Pharyngeal- Honey Teaspoon -- Pharyngeal -- Pharyngeal- Honey Cup Delayed swallow initiation-vallecula Pharyngeal -- Pharyngeal- Nectar Teaspoon Delayed swallow initiation-vallecula;Delayed swallow initiation-pyriform sinuses Pharyngeal -- Pharyngeal- Nectar Cup Delayed swallow initiation-vallecula;Delayed swallow initiation-pyriform sinuses;Penetration/Aspiration during swallow Pharyngeal Material enters airway, passes BELOW cords then ejected out;Material enters airway, CONTACTS cords and not ejected out Pharyngeal- Nectar Straw -- Pharyngeal -- Pharyngeal- Thin Teaspoon -- Pharyngeal -- Pharyngeal- Thin Cup Penetration/Aspiration during swallow;Delayed swallow initiation-pyriform sinuses Pharyngeal Material enters  airway, CONTACTS cords and not ejected out Pharyngeal- Thin Straw -- Pharyngeal -- Pharyngeal- Puree Delayed swallow initiation-vallecula Pharyngeal -- Pharyngeal- Mechanical Soft -- Pharyngeal -- Pharyngeal- Regular -- Pharyngeal -- Pharyngeal- Multi-consistency -- Pharyngeal -- Pharyngeal- Pill WFL Pharyngeal -- Pharyngeal Comment --  CHL IP CERVICAL ESOPHAGEAL PHASE 01/14/2020 Cervical Esophageal Phase WFL Pudding Teaspoon -- Pudding Cup -- Honey Teaspoon -- Honey Cup -- Nectar Teaspoon -- Nectar Cup -- Nectar Straw -- Thin Teaspoon -- Thin Cup -- Thin Straw -- Puree -- Mechanical Soft -- Regular -- Multi-consistency -- Pill -- Cervical Esophageal Comment -- Little Ishikawarin E Smith 01/14/2020, 10:41 AM              Recent Labs    01/14/20 0754  WBC 4.2  HGB 11.6*  HCT 35.1*  PLT 186   Recent Labs    01/14/20 0754  NA 140  K 4.2  CL 109  CO2 23  GLUCOSE 116*  BUN 15  CREATININE 1.08  CALCIUM 9.1    Intake/Output Summary (Last 24 hours) at 01/15/2020 1427 Last data filed at 01/15/2020 1250 Gross per 24 hour  Intake 190 ml  Output 650 ml  Net -460 ml        Physical Exam: Vital Signs Blood pressure 126/76, pulse 88, temperature 99.1 F (37.3 C), temperature source Oral, resp. rate 18, height 5\' 8"  (1.727 m), weight 69.7 kg, SpO2 94 %. Gen: no distress, normal appearing HEENT: oral mucosa pink and moist, NCAT Cardio: Reg rate Chest: normal effort, normal rate of breathing Abd: soft, non-distended Ext: no edema Musculoskeletal:     Cervical back: Neck supple.     Comments: No edema or tenderness in extremities  Skin:    General: Skin is warm and dry.  Neurological:     Mental Status: He is alert.     Comments: Alert Limited attention and delay in processing.   ?  Expressive aphasia- nodding head yes and no, improving to 5 word phrases Follows  simple commands. Motor: Appears to be 5/5 throughout  Psychiatric:        Mood and Affect: Affect is blunt.        Speech: Speech is  delayed and slurred.        Behavior: Behavior is slowed.        Cognition and Memory: Cognition is impaired.    Assessment/Plan: 1. Functional deficits which require 3+ hours per day of interdisciplinary therapy in a comprehensive inpatient rehab setting.  Physiatrist is providing close team supervision and 24 hour management of active medical problems listed below.  Physiatrist and rehab team continue to assess barriers to discharge/monitor patient progress toward functional and medical goals  Care Tool:  Bathing    Body parts bathed by patient: Chest,Abdomen,Face,Right arm,Left arm,Right upper leg,Left upper leg   Body parts bathed by helper: Right lower leg,Left lower leg Body parts n/a: Buttocks,Front perineal area (pt previously cleansed)   Bathing assist Assist Level: 2 Helpers     Upper Body Dressing/Undressing Upper body dressing   What is the patient wearing?: Pull over shirt    Upper body assist Assist Level: Minimal Assistance - Patient > 75%    Lower Body Dressing/Undressing Lower body dressing      What is the patient wearing?: Incontinence brief,Pants     Lower body assist Assist for lower body dressing: Maximal Assistance - Patient 25 - 49%     Toileting Toileting Toileting Activity did not occur (Clothing management and hygiene only): N/A (no void or bm)  Toileting assist Assist for toileting: 2 Helpers     Transfers Chair/bed transfer  Transfers assist  Chair/bed transfer activity did not occur: Safety/medical concerns  Chair/bed transfer assist level: Moderate Assistance - Patient 50 - 74%     Locomotion Ambulation   Ambulation assist      Assist level: 2 helpers   Max distance: 5   Walk 10 feet activity   Assist  Walk 10 feet activity did not occur: Safety/medical concerns        Walk 50 feet activity   Assist Walk 50 feet with 2 turns activity did not occur: Safety/medical concerns         Walk 150 feet  activity   Assist Walk 150 feet activity did not occur: Safety/medical concerns         Walk 10 feet on uneven surface  activity   Assist Walk 10 feet on uneven surfaces activity did not occur: Safety/medical concerns         Wheelchair     Assist Will patient use wheelchair at discharge?: Yes Type of Wheelchair: Manual    Wheelchair assist level: Supervision/Verbal cueing Max wheelchair distance: 166ft    Wheelchair 50 feet with 2 turns activity    Assist        Assist Level: Supervision/Verbal cueing   Wheelchair 150 feet activity     Assist      Assist Level: Supervision/Verbal cueing   Blood pressure 126/76, pulse 88, temperature 99.1 F (37.3 C), temperature source Oral, resp. rate 18, height 5\' 8"  (1.727 m), weight 69.7 kg, SpO2 94 %.  Medical Problem List and Plan: 1.  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             -patient may shower             -ELOS/Goals: 14-17 days/Min A  Continue CIR 2.  Antithrombotics: -DVT/anticoagulation: Lovenox             -antiplatelet therapy: Aspirin 81 mg daily and Plavix 75 mg daily x3 months then aspirin daily 3. Pain Management: Continue Tylenol as needed 4. Mood: Provide emotional support             -antipsychotic agents: N/A 5. Neuropsych: This patient is not capable of making decisions on his own behalf. 6. Skin/Wound Care: Routine skin checks. May d/c OV 7. Fluids/Electrolytes/Nutrition: Routine and outs.             12/20 electrolytes stable- repeat in 1 week.  8.  Hypertension.  Well controlled, continue Norvasc 5 mg daily.    12/21: well controlled: continue norvasc.              Monitor with increased mobility 9.  Hyperlipidemia.  Lipitor 10.  BPH.  Continue Flomax 0.4 mg daily.               PVRs ordered 11.  Severe sepsis with acute enteritis.  Completed course of azithromycin.  Monitor  hydration. 12.  AKI: resolved. Repeat Cr in 1 week.  13. Dysphagia: D2 nectar thick, MBS 12/20.  14. Nausea: continue zofran prn    LOS: 6 days A FACE TO FACE EVALUATION WAS PERFORMED  Clint Bolder P Eathon Valade 01/15/2020, 2:27 PM

## 2020-01-15 NOTE — Progress Notes (Signed)
Speech Language Pathology Daily Session Note  Patient Details  Name: Alexander Byrd MRN: [id number] Date of Birth: 11-Apr-1950  Today's Date: 01/15/2020 SLP Individual Time: 1100-1157 SLP Individual Time Calculation (min): 57 min  Short Term Goals: Week 1: SLP Short Term Goal 1 (Week 1): Pt will consume trials of ice/thin H2O with minimal overt s/sx aspiration X2 prior to repeat MBSS. SLP Short Term Goal 2 (Week 1): Pt will demonstrate efficient masticaiton and oral clearance of Dys 3 textures with no more than Min A cues for swallow strategies prior to upgrade. SLP Short Term Goal 3 (Week 1): Pt will use compensatory strategies for speech intelligibility with Mod A verbal/visual cues. SLP Short Term Goal 4 (Week 1): Pt will demonstrate initiate request to communicate his wants and needs with Mod A multimodal cues. SLP Short Term Goal 5 (Week 1): Pt will sustain attention to tasks with Mod A for 5-7 minute intervals. SLP Short Term Goal 5 - Progress (Week 1): Progressing toward goal SLP Short Term Goal 6 (Week 1): Pt will recall new/daily information with Mod A for use of strategies/aids.  Skilled Therapeutic Interventions: Pt was seen for skilled ST targeting dysphagia and cognitive-linguistic goals. Pt indicated that he did prefer use of voice amplifier during session, therefore assisted with donning. With a significant amount of extra time for processing and Mod A for functional problem solving pt calculated totals and displayed set amounts of change during a basic money management task. He required increased Mod-Max A in order to calculate change (subtraction). Provided Min A mainly visual cues for tracking, pt read basic medication labels from the [facility] and interpreted them with Min A verbal and visual cues for verbal problem solving, but Mod A verbal/visual cues were required for functional problem solving when attempting to organize a basic pill chart. Pt responses appropriate throughout  session without evidence of word finding difficulty, although fluctuating levels of cueing (Min-Mod) are required for verbal initiation and his responses are delayed; therefore the communication partner must allow extra time for pt to respond to questions, and occasionally provide repetition of instructions/questions. Although intake was very limited due to lack of appetite, pt participated in a small trial of dysphagia 3 (mechanical soft) texture snack. His mastication was mildly prolonged but functional, and full oral clearance achieved without cueing. He self fed spoon sips of nectar juice as well, with 1 throat clear noted (acros ~2oz  intake total). Recommend continue current diet for now and ST will continue to assess potential for upgrades. Pt left semi-reclined in bed with alarm set and needs within reach. Continue per current plan of care.      Pain Pain Assessment Pain Scale: 0-10 Pain Score: 0-No pain  Therapy/Group: Individual Therapy  [name] 01/15/2020, 7:23 AM

## 2020-01-15 NOTE — Progress Notes (Signed)
Occupational Therapy Session Note  Patient Details  Name: Alexander Byrd MRN: [id number] Date of Birth: July 11, 1950  Today's Date: 01/15/2020 OT Individual Time: 1446-1530 OT Individual Time Calculation (min): 44 min    Short Term Goals: Week 1:  OT Short Term Goal 1 (Week 1): Pt will sit to EOB with mod A. OT Short Term Goal 2 (Week 1): Pt will maintain static sit at EOB with min A to engage in self care. OT Short Term Goal 3 (Week 1): Pt will be able to bathe both arms with CGA. OT Short Term Goal 4 (Week 1): Pt will transfer to [facility] with max A.  Skilled Therapeutic Interventions/Progress Updates:    Pt in bed to start session reporting needing to have his brief changed.  Worked on doffing brief and cleaning up slight BM as well as urine soaked brief.  He needed mod assist for rolling side to side with total assist for completion of washing buttocks as well as donning new brief.  He was able to wash his front peri area when presented with a washcloth and the HOB slightly elevated.  Max assist was needed for transition to sitting EOB on the right side.  He needed max assist for static sitting balance secondary to posterior LOB as well as LOB to the left.  He exhibits increased pushing to the left side as well.  Max assist was needed for donning new pull up pants EOB with total assist for pulling them up with standing in the [place].  He exhibited increased knee flexion and posterior lean in the [place], but was able to initiate and achieve partial standing with min assist when pulling up on the bar.  Had him then work on sitting in the [place] in front of the mirror to help increase perception of midline orientation secondary to left head tilt and left lean.  With mod facilitation, he was able to get to midline.  Next, had him work on standing and functional mobility with three muskateers technique.  He was able to walk 5 and 7-8' with total +2 (pt 40%).  Decreased ability to maintain left knee  extension, trunk, or hip extension during stepping.  Finished session with transfer back to supine with mod assist.  He was left in the bed with his spouse present in the room and call button and phone in reach and bed alarm in place.    Therapy Documentation Precautions:  Precautions Precautions: Fall Precaution Comments: R>L hemi, R inattention. Poor postural control. Restrictions Weight Bearing Restrictions: No   Pain: Pain Assessment Pain Scale: Faces Pain Score: 0-No pain ADL: See Care Tool Section for some details of mobility and selfcare  Therapy/Group: Individual Therapy  Marya Lowden OTR/L 01/15/2020, 3:42 PM

## 2020-01-15 NOTE — Progress Notes (Signed)
Physical Therapy Session Note  Patient Details  Name: Finlee Milo MRN: [id number] Date of Birth: 1950/07/21  Today's Date: 01/15/2020 PT Individual Time: 0900-1000 PT Individual Time Calculation (min): 60 min   Short Term Goals: Week 1:  PT Short Term Goal 1 (Week 1): Pt will perform swing pivot transfer with min assist PT Short Term Goal 2 (Week 1): Pt will perform bed mobility with CGA PT Short Term Goal 3 (Week 1): Pt will ambulate with max assist of 1 and LRAD PT Short Term Goal 4 (Week 1): pt will demonstrate upright posture at bedside and maintain with dynamic movements Week 2:    Week 3:     Skilled Therapeutic Interventions/Progress Updates:    PAIN Denies pain this am Pt initially supine.  Lifts feet for therapist to thread pants.  Pt able to bridge and raise pants w/max cues, mod assist to complete. Supine to side to sit w/mod assist, leans L and post.  Worked on midline/leaning to r elbow/return to upright mult times, improved midliine following activity.   TRANSFERS squat pivot bed to wc to L side w/mod assist and multimodal cues for wt shifting w/task, sequencing.  wc propulsion - attempted propulsion using bilat LEs but pt unable to coordinate task, transported to gym  Repeated later in session, can perfom w/max cues and mod assist x 25 ft for bipedal task.  Sit to stand in parallel bars w/mod assist, initially leaning L w/L kee flexed/hip retracted.  Max multimodal cues/tactile facilitation to correct. Repeated using mirror for feedback, improved upright posture and pt volitionally contacting quads and gluts on L /difficulty maintaining but continued e76fforts, tolerated 2-3 min.  Repeated w/decreased upright/increased L lean, increased L flexion, 1 min   Squat pivot transfers to/from mat to L and to R heavy mod assist, multimodal cues for wt shfiting/sequencing/safety.  Poor midline w/task, leans L  SITTING BALANCE on mat w/mirror for feedback worked on reaching  to r using RUE for dual goal coordination of UE/wt shifting to R for improved midline orientation.  Pt able to maintain midline during tasks, but lean persists following task.  Can somewhat correct using mirror but maintains short periods due to decreased attention.  wc propulsion x 159ft in hall w/supervision only using bilat UEs. Pt left oob in wc w/alarm belt set and needs in reach  Therapy Documentation Precautions:  Precautions Precautions: Fall Precaution Comments: R>L hemi, R inattention. Poor postural control. Restrictions Weight Bearing Restrictions: No    Therapy/Group: Individual Therapy  Rada Hay, PT   Shearon Balo 01/15/2020, 12:46 PM

## 2020-01-15 NOTE — Progress Notes (Signed)
Physical Therapy Session Note  Patient Details  Name: Alexander Byrd MRN: [id number] Date of Birth: 1950/09/10  Today's Date: 01/15/2020 PT Individual Time: 1303-1330 PT Individual Time Calculation (min): 27 min   Short Term Goals: Week 1:  PT Short Term Goal 1 (Week 1): Pt will perform swing pivot transfer with min assist PT Short Term Goal 2 (Week 1): Pt will perform bed mobility with CGA PT Short Term Goal 3 (Week 1): Pt will ambulate with max assist of 1 and LRAD PT Short Term Goal 4 (Week 1): pt will demonstrate upright posture at bedside and maintain with dynamic movements  Skilled Therapeutic Interventions/Progress Updates: Pt presented in bed ageeable to therapy. Pt denies pain during session. Performed supine to sit with modA and use of bed features with pt requiring mod multimodal cues for achieving midline in sitting. Required minA and use of draw sheet to to scoot to EOB. Performed squat pivot transfer to L with modA. Pt transported to rehab gym and performed squat pivot to R with modA to mat. Participated in sitting balance using LUE to tap cones on R for R trunk activation (lateral reaches and rotation), emphasis on attaining midline between reaches. Participated in R anteriorlateral reaches placing cards on board. With fatigue pt noted to have increased L lateral lean requiring increased multimodal cues to correct. Performed squat pivot transfer to L with mod A to return to w/c and transported back to room. Performed squat pivot to R with modA and pt using bed rail. MinA for sit to supine with use of bed featues. Pt left supine with bed alarm on, call bell within reach and needs met.      Therapy Documentation Precautions:  Precautions Precautions: Fall Precaution Comments: R>L hemi, R inattention. Poor postural control. Restrictions Weight Bearing Restrictions: No General:   Vital Signs:   Pain: Pain Assessment Pain Scale: Faces Pain Score: 0-No  pain    Therapy/Group: Individual Therapy  Dosia Yodice  Yaxiel Minnie, PTA  01/15/2020, 4:34 PM

## 2020-01-16 ENCOUNTER — Inpatient Hospital Stay (HOSPITAL_COMMUNITY): Payer: Medicare Other | Admitting: Occupational Therapy

## 2020-01-16 ENCOUNTER — Encounter (HOSPITAL_COMMUNITY): Payer: Medicare Other | Admitting: Psychology

## 2020-01-16 ENCOUNTER — Inpatient Hospital Stay (HOSPITAL_COMMUNITY): Payer: Medicare Other | Admitting: Physical Therapy

## 2020-01-16 ENCOUNTER — Inpatient Hospital Stay (HOSPITAL_COMMUNITY): Payer: Medicare Other | Admitting: Speech Pathology

## 2020-01-16 DIAGNOSIS — G463 Brain stem stroke syndrome: Secondary | ICD-10-CM | POA: Diagnosis present

## 2020-01-16 MED ORDER — SENNOSIDES-DOCUSATE SODIUM 8.6-50 MG PO TABS
2.0000 | ORAL_TABLET | Freq: Two times a day (BID) | ORAL | Status: DC
  Administered 2020-01-16 – 2020-02-01 (×30): 2 via ORAL
  Filled 2020-01-16 (×34): qty 2

## 2020-01-16 NOTE — Progress Notes (Signed)
Occupational Therapy Session Note  Patient Details  Name: Alexander Byrd MRN: [id number] Date of Birth: 1950/02/27  Today's Date: 01/16/2020 OT Individual Time: 0272-5366 OT Individual Time Calculation (min): 73 min    Short Term Goals: Week 1:  OT Short Term Goal 1 (Week 1): Pt will sit to EOB with mod A. OT Short Term Goal 2 (Week 1): Pt will maintain static sit at EOB with min A to engage in self care. OT Short Term Goal 3 (Week 1): Pt will be able to bathe both arms with CGA. OT Short Term Goal 4 (Week 1): Pt will transfer to [facility] with max A.      Skilled Therapeutic Interventions/Progress Updates:    Pt seen this session to focus on general strength and core control/ strength for improved balance.   Pt received in bed.  Pt began working on BUE AROM exercises with hands clasped with min cues and guiding A. Rolled to R with min and sat to EOB with mod A, initially needing max A and then progressing to CGA due to L and posterior lean. Worked on EOB sitting for 5 min and then transferred to wc with mod A - max A squat pivot.   Pt transported to gym and transferred to mat to work on postural control to correct L lean and posterior lean and flexed forward trunk.  Pt worked on a variety of NMR activities: -dynamic reach to R with hand on ball pushing ball to R - hand on vertical dowel pushing to R -hips elevated on wedge to facilitate ant pelvic tilt - RUE supported on elevated tray table while dynamically reaching with L overhead for trunk elongation and to the R for R visual scanning -B UE on dowel bar for forward reaching and wt shifting -use of mirror for feedback Pt participated well but needs MOD to MAX assist to accomplish these tasks. With multiple repetitions, he needed MIN to Mod A. +2 A today to help with easier transfers and to guard pt on L side as therapist provided manual facilitation for trunk elongation to sit upright and R trunk lengthening to the R.   Transferred  back to wc back to bed. From bed worked on B knee twists for low back stretches and bridges. Pt set up in bed to prepare for lunch. Bed alarm set and all needs met.   Therapy Documentation Precautions:  Precautions Precautions: Fall Precaution Comments: R>L hemi, R inattention. Poor postural control. Restrictions Weight Bearing Restrictions: No   Pain: Pain Assessment Pain Score: 0-No pain    Therapy/Group: Individual Therapy  [place] 01/16/2020, 12:46 PM

## 2020-01-16 NOTE — Progress Notes (Signed)
Patient ID: Alexander Byrd, male   DOB: [birth year], 69 y.o.   MRN: [id number] Team Conference Report to Patient/Family  Team Conference discussion was reviewed with the patient and caregiver, including goals, any changes in plan of care and target discharge date.  Patient and caregiver express understanding and are in agreement.  The patient has a target discharge date of 02/02/20.  Andria Rhein 01/16/2020, 1:19 PM

## 2020-01-16 NOTE — Progress Notes (Signed)
Speech Language Pathology Daily Session Note  Patient Details  Name: Alexander Byrd MRN: [id number] Date of Birth: September 24, 1950  Today's Date: 01/16/2020 SLP Individual Time: 1300-1355 SLP Individual Time Calculation (min): 55 min  Short Term Goals: Week 1: SLP Short Term Goal 1 (Week 1): Pt will consume trials of ice/thin H2O with minimal overt s/sx aspiration X2 prior to repeat MBSS. SLP Short Term Goal 2 (Week 1): Pt will demonstrate efficient masticaiton and oral clearance of Dys 3 textures with no more than Min A cues for swallow strategies prior to upgrade. SLP Short Term Goal 3 (Week 1): Pt will use compensatory strategies for speech intelligibility with Mod A verbal/visual cues. SLP Short Term Goal 4 (Week 1): Pt will demonstrate initiate request to communicate his wants and needs with Mod A multimodal cues. SLP Short Term Goal 5 (Week 1): Pt will sustain attention to tasks with Mod A for 5-7 minute intervals. SLP Short Term Goal 5 - Progress (Week 1): Progressing toward goal SLP Short Term Goal 6 (Week 1): Pt will recall new/daily information with Mod A for use of strategies/aids.  Skilled Therapeutic Interventions: Pt was seen for skilled ST targeting dysphagia and cognitive goals. SLP facilitated session with trials of ice chips after oral care. Pt exhibited throat clear x2 and cough x1 out of 11 ice chip presentations. Recommend continue with current Dys 2/Nectar diet with full supervision. Pt also completed basic PEG board designs with overall Min A verbal and/or visual cueing for redirection (due to reduced attention), error awareness, and problem solving. He did demonstrate ability to self-detect and correct small errors X2 during PEG aborad task. In addition, he initiated use of call bell to request his brief to be changed during session. With still with very delayed processing, requiring a significant amount of extra time to complete tasks, however he continues to make slow,  steady improvements in ST. Pt left semi-reclined in bed with alarm set and needs within reach. Continue per current plan of care.     Pain Pain Assessment Pain Scale: 0-10 Pain Score: 0-No pain  Therapy/Group: Individual Therapy  [name] 01/16/2020, 7:23 AM

## 2020-01-16 NOTE — Progress Notes (Signed)
Physical Therapy Session Note  Patient Details  Name: Alexander Byrd MRN: [id number] Date of Birth: Feb 06, 1950  Today's Date: 01/16/2020 PT Individual Time: 0905-1000 PT Individual Time Calculation (min): 55 min   Short Term Goals: Week 1:  PT Short Term Goal 1 (Week 1): Pt will perform swing pivot transfer with min assist PT Short Term Goal 2 (Week 1): Pt will perform bed mobility with CGA PT Short Term Goal 3 (Week 1): Pt will ambulate with max assist of 1 and LRAD PT Short Term Goal 4 (Week 1): pt will demonstrate upright posture at bedside and maintain with dynamic movements  Skilled Therapeutic Interventions/Progress Updates:   Pt received supine in bed and agreeable to PT. Pt assisted pt to don teds with total A while supine and mod-max assist to pull pants to waist at bed level for safety. Supine>sit transfer with min-mod assist and moderate cues for use sequencing and improved pelvic positioning EOB to prevent lateral LOB. Squat pivot transfer to [facility] with mod assist and LLE blocked to improve stability through BLE.  Pt transported to rehab gym in [facility]. Sit<>stand from [facility] with RW x 3 to perform standing tolerance; min assist achieve standing and then mod assist to maintain balance in standing to prevent LOB to the L due to moderate pushers syndrome response up to 45 seconds.   Standing frame x 2 bouts to perform lateral reach to the R to force weight shift to the R as well as improve trunkal elongation on the R side x 6 to obtain horse shoes outside BOS. Noted improvement in weight shift R throughout.   Gait training with  3 musketeer assist +2' 29ft x2 with mod assist provided by each PT. Visual/physical cues to maintain wide BOS with board between feet and max cues for weight shift to the R to allow LLE advancement and increased terminal knee extension on the L.   Pt returned to room and performed squat pivot transfer to bed with UE supported on bed rail. Sit>supine completed with min  assist for LLE management, and left supine in bed with call bell in reach and all needs met.         Therapy Documentation Precautions:  Precautions Precautions: Fall Precaution Comments: R>L hemi, R inattention. Poor postural control. Restrictions Weight Bearing Restrictions: No Pain: denies   Therapy/Group: Individual Therapy  [name] 01/16/2020, 11:16 AM

## 2020-01-16 NOTE — Progress Notes (Addendum)
[facility] PHYSICAL MEDICINE & REHABILITATION PROGRESS NOTE   Subjective/Complaints:  Patient was seen in physical therapy today.  Mild dysarthria, occasional double vision per patient, denies any numbness on the right or left side, as per OT leans heavily toward the left.  ROS: Denies pain, denies nausea vomiting diarrhea constipation  Objective:   No results found. Recent Labs    01/14/20 0754  WBC 4.2  HGB 11.6*  HCT 35.1*  PLT 186   Recent Labs    01/14/20 0754  NA 140  K 4.2  CL 109  CO2 23  GLUCOSE 116*  BUN 15  CREATININE 1.08  CALCIUM 9.1    Intake/Output Summary (Last 24 hours) at 01/16/2020 1014 Last data filed at 01/16/2020 9373 Gross per 24 hour  Intake 240 ml  Output 900 ml  Net -660 ml        Physical Exam: Vital Signs Blood pressure 138/84, pulse 84, temperature 98.1 F (36.7 C), temperature source Oral, resp. rate 15, height 5\' 8"  (1.727 m), weight 69.7 kg, SpO2 97 %.  General: No acute distress Mood and affect are appropriate Heart: Regular rate and rhythm no rubs murmurs or extra sounds Lungs: Clear to auscultation, breathing unlabored, no rales or wheezes Abdomen: Positive bowel sounds, soft nontender to palpation, nondistended Extremities: No clubbing, cyanosis, or edema Skin: No evidence of breakdown, no evidence of rash   Musculoskeletal:     Cervical back: Neck supple.     Comments: No edema or tenderness in extremities  Skin:    General: Skin is warm and dry.  Neurological:     Mental Status: He is alert.     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         Speech: Speech is delayed and slurred.        Behavior: Behavior is slowed.        Cognition and Memory: Cognition is impaired.    Assessment/Plan: 1. Functional deficits which require 3+ hours per day of interdisciplinary therapy in a comprehensive inpatient rehab setting.  Physiatrist is providing close team supervision and 24 hour management of active medical problems listed below.  Physiatrist and rehab team continue to assess barriers to discharge/monitor patient progress toward functional and medical goals  Care Tool:  Bathing    Body parts bathed by patient: Chest,Abdomen,Face,Right arm,Left arm,Right upper leg,Left upper leg   Body parts bathed by helper: Right lower leg,Left lower leg Body parts n/a: Buttocks,Front perineal area (pt previously cleansed)   Bathing assist Assist Level: 2 Helpers     Upper Body Dressing/Undressing Upper body dressing   What is the patient wearing?: Pull over shirt    Upper body assist Assist Level: Minimal Assistance - Patient > 75%    Lower Body Dressing/Undressing Lower body dressing      What is the patient wearing?: Incontinence brief,Pants     Lower body assist Assist for lower body dressing: Maximal Assistance - Patient 25 - 49%     Toileting Toileting Toileting Activity did not occur (Clothing management and hygiene only): N/A (no void or bm)  Toileting assist Assist for  toileting: Total Assistance - Patient < 25%     Transfers Chair/bed transfer  Transfers assist  Chair/bed transfer activity did not occur: Safety/medical concerns  Chair/bed transfer assist level: 2 Helpers (stand pivot three muskateers technique)     Locomotion Ambulation   Ambulation assist      Assist level: 2 helpers   Max distance: 8'   Walk 10 feet activity   Assist  Walk 10 feet activity did not occur: Safety/medical concerns        Walk 50 feet activity   Assist Walk 50 feet with 2 turns activity did not  occur: Safety/medical concerns         Walk 150 feet activity   Assist Walk 150 feet activity did not occur: Safety/medical concerns         Walk 10 feet on uneven surface  activity   Assist Walk 10 feet on uneven surfaces activity did not occur: Safety/medical concerns         Wheelchair     Assist Will patient use wheelchair at discharge?: Yes Type of Wheelchair: Manual    Wheelchair assist level: Supervision/Verbal cueing Max wheelchair distance: 126ft    Wheelchair 50 feet with 2 turns activity    Assist        Assist Level: Supervision/Verbal cueing   Wheelchair 150 feet activity     Assist      Assist Level: Supervision/Verbal cueing   Blood pressure 138/84, pulse 84, temperature 98.1 F (36.7 C), temperature source Oral, resp. rate 15, height 5\' 8"  (1.727 m), weight 69.7 kg, SpO2 97 %.  Medical Problem List and Plan: 1.  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             -patient may shower             -ELOS/Goals: 1/8             Continue CIR 2.  Antithrombotics: -DVT/anticoagulation: Lovenox             -antiplatelet therapy: Aspirin 81 mg daily and Plavix 75 mg daily x3 months then aspirin daily 3. Pain Management: Continue Tylenol as needed 4. Mood: Provide emotional support             -antipsychotic agents: N/A 5. 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 Skin/Wound Care: Routine skin checks. May d/c OV 7. Fluids/Electrolytes/Nutrition: Routine and outs.             12/20 electrolytes stable- repeat in 1 week.  8.  Hypertension.  Well controlled, continue Norvasc 5 mg daily.                   Vitals:   01/15/20 2009 01/16/20 0514  BP: (!) 137/91 138/84  Pulse: 92 84  Resp: 15 15  Temp: 98.7 F (37.1 C) 98.1 F (36.7 C)  SpO2: 97% 97%   Controlled, 12/22  9.  Hyperlipidemia.  Lipitor 10.  BPH.  Continue Flomax 0.4 mg daily.               PVRs ordered 11.  Severe sepsis with acute enteritis.  Completed course of azithromycin.  Monitor hydration. 12.  AKI: resolved. Repeat Cr in 1 week.  13. Dysphagia: D2 nectar thick, MBS 12/20.  14. Nausea: continue zofran prn  15.  Constipation increase Senna  S  LOS: 7 days A FACE TO FACE EVALUATION WAS PERFORMED  Erick Colace 01/16/2020, 10:14 AM

## 2020-01-16 NOTE — Progress Notes (Signed)
Pt declined use of Prevalon boots through the night.

## 2020-01-16 NOTE — Patient Care Conference (Signed)
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 Claudette Laws Care Coodinator Present: Chana Bode, RN, BSN, CRRN;Christina Crossgate, BSW Nurse Present: Konrad Dolores, RN PT Present: Grier Rocher, PT OT Present: Roney Mans, OT SLP Present: Suzzette Righter, CF-SLP PPS Coordinator present : Fae Pippin, [place], PT     Current Status/Progress Goal Weekly Team Focus  Bowel/Bladder   Pt requiring I&O caths every 8 hours for no void. Incontinent of BMs. LBM 01/13/2020  Pt to gain ability to void.  Assess B/B every shift and PRN   Swallow/Nutrition/ Hydration   Dys 2 textures, nectar thick liquids, full supervision  Supervision  trials upgraded textures, carryover swallow strategies   ADL's             Mobility   mod assist w/bed mob, transfers, max of 2 for short distance gait, poor midline orientation/leans L  min assist  midline orientation in sitting and standing, transfers   Communication   delayed processing and responses but language mostly WFL, mild dysarthria  Supervision  verbal initiation, speech intelligibility   Safety/Cognition/ Behavioral Observations  Min-Mod  Supervision  error awareness, basic familiar problem solving, sustained attention, memory   Pain   FACES scale 4/10  FACES <4/10  Assess pain every shift and PRN   Skin   Skin intact  no new  breakdown  Assess skin every shift and PRN     Discharge Planning:  Discharging home with spouse, and sisters (lives next door). Ramp entrance   Team Discussion: Plan to discharge home with family to ramped entranced home. Requires I+O catheterizations for urinary retention and constipation addressed. Progress impaired by delayed initiation and processing along with midline disorientation and poor standing tolerance with ataxia and pushing left. Also need extra time for actions  Patient on target to meet rehab goals: yes, supervision to CGA goals set  *See Care Plan and progress notes for long and short-term goals.   Revisions to Treatment Plan:  Recommend using voice amplifiers when engaging with the patient  MBS scheduled for 01/15/20 Teaching Needs: Transfers, toileting, medications, etc.  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  Work on truncal stability safety awareness, speech is working on high-level cognition   Continued Need for Acute Rehabilitation Level of Care: The patient requires daily medical management by a physician with specialized training in physical medicine and rehabilitation for the following reasons: Direction of a multidisciplinary physical rehabilitation program to maximize functional independence : Yes Medical management of patient stability  for increased activity during participation in an intensive rehabilitation regime.: Yes Analysis of laboratory values and/or radiology reports with any  subsequent need for medication adjustment and/or medical intervention. : Yes   I attest that I was present, lead the team conference, and concur with the assessment and plan of the team.   Chana Bode B 01/16/2020, 12:57 PM

## 2020-01-16 NOTE — Progress Notes (Signed)
Patient's sister concern re: eyedrops on left eye reason for having it. Will let MD aware in am. RN to report on incomimg RN.

## 2020-01-16 NOTE — Consult Note (Signed)
Neuropsychological Consultation   Patient:   Alexander Byrd   DOB:   August 06, 1950  MR Number:  [id number]  Location:  [facility] [address] CENTER A [address] [id number] [place] [address] Dept: [phone] Loc: [phone]           Date of Service:   01/16/2020  Start Time:   2 PM End Time:   3 PM  Provider/Observer:  Arley Phenix, Psy.D.       Clinical Neuropsychologist       Billing Code/Service: [id number]  Chief Complaint:    Alexander Byrd is a 69 year old male with history of hyperlipidemia, hypertension and recent CVA around early November.  Patient persisted with left-sided weakness and slurred speech and was admitted to [facility] and then transferred to skilled nursing facility for 3 weeks afterwards.  Patient presented to [facility] on 12/31/2019 with indications of severe septic symptoms including tachypnea, tachycardia, lactic acidosis with lactic acid peaked at 6.  Patient with cognitive changes and encephalopathy.  Patient treated with broad-spectrum antibiotics.  Patient had negative EEG.  CT/MRI showed subtle increased diffusion abnormality involving the left pons (middle cerebral artery peduncle  and adjacent left cerebellar hemisphere.  There are also few scattered remote lacunar infarcts around the left basal ganglia and corona radiata.  Issues with left PICA were noted.  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  Patient has continued with significant motor deficits and dysarthria and dysphagia.  Reason for Service:  Patient was referred for neuropsychological consultation due to coping adjustment following CVA.  Below is the HPI for the current admission.  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  History  taken from chart review due to cognition.  He was initially admitted to [facility] after his most recent CVA and was discharged to skilled nursing facility where he developed abdominal pain nausea and vomiting.  Patient with prior CVA and lives with spouse.  1 level home ramped entrance.  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  He presented to [facility] 12/31/2019.  EEG negative for seizures.  CT/MRI showed subtly increased diffusion abnormality involving the left pons middle cerebellar peduncle and adjacent left cerebellar hemisphere.  No other acute abnormality.  There was a few scattered remote lacunar infarcts about the left basal ganglia and corona radiata.  CT of abdomen pelvis showed findings suggestive of enteritis.  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  Short segment moderate stenosis involving the mid right M1 segment.  Echocardiogram with EF of 60-65%, no wall motion abnormalities grade 1 diastolic dysfunction.  Follow-up MRI completed findings consistent with left PICA infarction advised continue aspirin and Plavix.  Admission chemistries unremarkable aside glucose 169, creatinine 1.48, lipase 33, urinalysis negative nitrite with urine culture no growth, lactic acid 4.4, C. difficile negative, blood cultures no growth to date.  Hospital course work-up for sepsis related to acute enteritis he was placed on azithromycin x3 days as well as IV gentle hydration.  Latest creatinine improved 1.07 after gentle hydration.  He is currently on a dysphagia #1 honey thick liquid diet.  Subcutaneous Lovenox for DVT  prophylaxis.  Therapy evaluations completed and patient was admitted for a comprehensive rehab program.  Please see preadmission assessment from earlier today as well.  Current  Status:  The patient was laying almost fully reclined in his bed upon entering the room.  He was awake and oriented.  Patient had movement in his extremities but was very difficult to understand due to expressive language likely most primary due to motor deficit.  Without prior experiences hard to know how much of his dysarthric speech was due to historic/premorbid pronunciation versus motor involvement.  Patient had paucity of self generated ideation or questions and appeared somewhat limited cognitively but also his challenge with expressive language may exaggerate those types of symptoms.  He was oriented.  Patient also had some cerebellar involvement with his most recent stroke, which can have an effect on emotional response.  Behavioral Observation: Alexander Byrd  presents as a 69 y.o.-year-old Right African American Male who appeared his stated age. his dress was Appropriate and he was Well Groomed and his manners were Appropriate to the situation.  his participation was indicative of Appropriate and Redirectable behaviors.  There were physical disabilities noted.  he displayed an appropriate level of cooperation and motivation.     Interactions:    Active Appropriate and Redirectable  Attention:   abnormal and attention span appeared shorter than expected for age  Memory:   abnormal; remote memory intact, recent memory impaired  Visuo-spatial:  not examined  Speech (Volume):  low  Speech:   garbled; slurred  Thought Process:  Coherent and Relevant  Though Content:  WNL; not suicidal and not homicidal  Orientation:   person, place, time/date and situation  Judgment:   Fair  Planning:   Poor  Affect:    Blunted  Mood:    Dysphoric  Insight:   Fair  Intelligence:   low   Medical History:  History reviewed. No pertinent past medical history.       Patient Active Problem List   Diagnosis Date Noted  . 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 Cerebellar stroke (HCC)   .  Dyslipidemia   . Benign prostatic hyperplasia   . Essential hypertension   . Altered mental status   . Enteritis   . Lactic acid acidosis   . Dehydration   . Sepsis (HCC) 12/31/2019     Psychiatric History:  No prior psychiatric history.  Family Med/Psych History: History reviewed. No pertinent family history.  Impression/DX:  Alexander Byrd is a 69 year old male with history of hyperlipidemia, hypertension and recent CVA around early November.  Patient persisted with left-sided weakness and slurred speech and was admitted to [facility] and then transferred to skilled nursing facility for 3 weeks afterwards.  Patient presented to [facility] on 12/31/2019 with indications of severe septic symptoms including tachypnea, tachycardia, lactic acidosis with lactic acid peaked at 6.  Patient with cognitive changes and encephalopathy.  Patient treated with broad-spectrum antibiotics.  Patient had negative EEG.  CT/MRI showed subtle increased diffusion abnormality involving the left pons (middle cerebral artery peduncle  and adjacent left cerebellar hemisphere.  There are also few scattered remote lacunar infarcts around the left basal ganglia and corona radiata.  Issues with left PICA were noted.  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  Patient has continued with significant motor deficits and dysarthria and dysphagia.  The patient was laying almost fully reclined in his bed upon entering the room.  He was awake and oriented.  Patient had movement in his  extremities but was very difficult to understand due to expressive language likely most primary due to motor deficit.  Without prior experiences hard to know how much of his dysarthric speech was due to historic/premorbid pronunciation versus motor involvement.  Patient had paucity of self generated ideation or questions and appeared somewhat limited cognitively but also his challenge with expressive language may exaggerate those types of  symptoms.  He was oriented.  Patient also had some cerebellar involvement with his most recent stroke, which can have an effect on emotional response.  Disposition/Plan:  Today we worked on coping and adjustment issues following recent CVA.  Patient seems to be doing okay as far as emotional status and coping abilities but rather flattened affect and difficult to get clear reading on his mood status with limited expressive language fluency.  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 Clinical Neuropsychologist

## 2020-01-16 NOTE — Progress Notes (Signed)
Speech Language Pathology Weekly Progress and Session Note  Patient Details  Name: Alexander Byrd MRN: [id number] Date of Birth: 1950/10/30  Beginning of progress report period: January 10, 2020 End of progress report period: January 17, 2020  Today's Date: 01/17/2020 SLP Individual Time: 4696-2952 SLP Individual Time Calculation (min): 58 min  Short Term Goals: Week 1: SLP Short Term Goal 1 (Week 1): Pt will consume trials of ice/thin H2O with minimal overt s/sx aspiration X2 prior to repeat MBSS. SLP Short Term Goal 1 - Progress (Week 1): Met SLP Short Term Goal 2 (Week 1): Pt will demonstrate efficient masticaiton and oral clearance of Dys 3 textures with no more than Min A cues for swallow strategies prior to upgrade. SLP Short Term Goal 2 - Progress (Week 1): Progressing toward goal SLP Short Term Goal 3 (Week 1): Pt will use compensatory strategies for speech intelligibility with Mod A verbal/visual cues. SLP Short Term Goal 3 - Progress (Week 1): Met SLP Short Term Goal 4 (Week 1): Pt will demonstrate initiate request to communicate his wants and needs with Mod A multimodal cues. SLP Short Term Goal 4 - Progress (Week 1): Met SLP Short Term Goal 5 (Week 1): Pt will sustain attention to tasks with Mod A for 5-7 minute intervals. SLP Short Term Goal 5 - Progress (Week 1): Met SLP Short Term Goal 6 (Week 1): Pt will recall new/daily information with Mod A for use of strategies/aids. SLP Short Term Goal 6 - Progress (Week 1): Met    New Short Term Goals: Week 2: SLP Short Term Goal 1 (Week 2): Pt will demonstrate efficient masticaiton and oral clearance of Dys 3 textures with no more than Min A cues for swallow strategies prior to upgrade. SLP Short Term Goal 2 (Week 2): Pt will consume trials of ice chips and thin H2O with minimal overt s/sx aspiration prior to repeating his MBSS to assess advancement potential (from nectar thick liquids). SLP Short Term Goal 3 (Week 2): Pt  will sustain his attention to functional tasks for 10-15 minute intervals with Min A cues for redirection. SLP Short Term Goal 4 (Week 2): Pt will recall new/daily information with Min A for use of strategies/aids. SLP Short Term Goal 5 (Week 2): Pt will demonstrate awareness of functional errors with Min A verbal/visual cues. SLP Short Term Goal 6 (Week 2): Pt will demonstrate to functionally problem solving basic to semi-complex situatoins with Min A cues.  Weekly Progress Updates: Pt has made functional gains and met 5 out of 6 short term goals this reporting period. Pt is currently Min-Mod assist for basic familiar tasks due to cognitive impairments impacting his emergent awareness, problem solving, attention, and short term memory. His cognitive processing is also very delayed, therefore, he requires extra time to complete tasks and to provide verbal responses. He seems to do slightly better with auditory processing with use of voice amplifier (quesiton influence of hearing loss?). He also has mild dysarthria and dysphonia, requiring Min-Supervision A cueing for use of compensatory strategies to increase his intelligibility. Pt participated in an MBSS this week is consuming a dysphagia 2 textures (minced/ground) diet with nectar liquids via spoon, medications whole in puree. Pt has demonstrated improved task and verbal initiation, as well as attention, basic problem solving, ability to communicate his basic wants and needs, and oropharyngeal swallow function. Pt education is ongoing; no family has been present for ST sessions to date, and they would benefit from skilled family education prior to  pt's discharge home. Pt would continue to benefit from skilled ST while inpatient in order to maximize functional independence and reduce burden of care prior to discharge. Anticipate that pt will need 24/7 supervision at discharge in addition to [place] follow up at next level of care.     Intensity: Minumum of 1-2  x/day, 30 to 90 minutes Frequency: 3 to 5 out of 7 days Duration/Length of Stay: 02/02/19 Treatment/Interventions: Cognitive remediation/compensation;Cueing hierarchy;Patient/family education;Therapeutic Activities;Functional tasks;Dysphagia/aspiration precaution training;Internal/external aids;Speech/Language facilitation   Daily Session  Skilled Therapeutic Interventions: Pt was seen for skilled ST targeting dysphagia and speech goals. SLP facilitated session with skilled observation of pt consuming current diet dysphagia 2 (minced/ground) texture breakfast items with nectar thick liquids. He required overall Min A verbal cues for recall and use of spoon for nectar intake (recc on last MBSS to control bolus size). When he did not use spoon, throat clearing noted with cup sips of nectar, but when spoon in use and throughout intake of Dys 2, no overt s/sx Aspiration noted. Intermittent verbal cues also provided for smaller bite size of solids. Mastication and oral clearance was efficient. Recommend continue current diet with full supervision. With voice amplifier donned, pt engaged in sentence level picture description as well as informal related conversation with therapist about familiar topics (holidays, hobbies, etc.). Pt with mild dysfluencies but no word finding difficulties, and only Supervision A verbal cues for repeating complex messages with a slower rate to correct dysfluency, coordinate respirations (to be more like an easy onset) and increase intelligibility. He was 95% intelligible throughout session. Pt left laying in bed with alarm set and needs within reach. Continue per current plan of care.         Pain Pain Assessment Pain Scale: 0-10 Pain Score: 0-No pain  Therapy/Group: Individual Therapy  Arbutus Leas 01/17/2020, 7:24 AM

## 2020-01-16 NOTE — Plan of Care (Signed)
  Problem: RH BOWEL ELIMINATION Goal: RH STG MANAGE BOWEL WITH ASSISTANCE Description: STG Manage Bowel with Assistance. Min assist Outcome: Not Progressing; constipation LBM 12/19; laxatives ordered   Problem: RH BLADDER ELIMINATION Goal: RH STG MANAGE BLADDER WITH ASSISTANCE Description: STG Manage Bladder With Assistance Min Outcome: Not Progressing; in and out cath

## 2020-01-17 ENCOUNTER — Inpatient Hospital Stay (HOSPITAL_COMMUNITY): Payer: Medicare Other | Admitting: Physical Therapy

## 2020-01-17 ENCOUNTER — Inpatient Hospital Stay (HOSPITAL_COMMUNITY): Payer: Medicare Other | Admitting: Occupational Therapy

## 2020-01-17 ENCOUNTER — Inpatient Hospital Stay (HOSPITAL_COMMUNITY): Payer: Medicare Other | Admitting: Speech Pathology

## 2020-01-17 MED ORDER — POLYVINYL ALCOHOL 1.4 % OP SOLN
1.0000 [drp] | Freq: Two times a day (BID) | OPHTHALMIC | Status: DC
  Administered 2020-01-17 – 2020-02-02 (×32): 1 [drp] via OPHTHALMIC
  Filled 2020-01-17: qty 15

## 2020-01-17 NOTE — Progress Notes (Signed)
[place] PHYSICAL MEDICINE & REHABILITATION PROGRESS NOTE   Subjective/Complaints:  No issues overnight, occ blurriness left eye, discussed CVA as it relates to eye problems, Reduced lid closure   ROS: Denies pain, denies nausea vomiting diarrhea constipation  Objective:   No results found. No results for input(s): WBC, HGB, HCT, PLT in the last 72 hours. No results for input(s): NA, K, CL, CO2, GLUCOSE, BUN, CREATININE, CALCIUM in the last 72 hours.  Intake/Output Summary (Last 24 hours) at 01/17/2020 1004 Last data filed at 01/17/2020 0906 Gross per 24 hour  Intake 2212.5 ml  Output 450 ml  Net 1762.5 ml        Physical Exam: Vital Signs Blood pressure (!) 151/89, pulse 91, temperature 98.6 F (37 C), resp. rate 18, height 5\' 8"  (1.727 m), weight 69.7 kg, SpO2 97 %.  General: No acute distress Mood and affect are appropriate Heart: Regular rate and rhythm no rubs murmurs or extra sounds Lungs: Clear to auscultation, breathing unlabored, no rales or wheezes Abdomen: Positive bowel sounds, soft nontender to palpation, nondistended Extremities: No clubbing, cyanosis, or edema Skin: No evidence of breakdown, no evidence of rash Neurologic: Cranial nerves II through XII intact, motor strength is 5/5 in bilateral deltoid, bicep, tricep, grip, hip flexor, knee extensors, ankle dorsiflexor and plantar flexor Sensory exam normal sensation to light touch and proprioception in bilateral upper and lower extremities Cerebellar exam mod dysmetria finger to nose to finger as well as heel to shin inLeft  upper and lower extremities Musculoskeletal: Full range of motion in all 4 extremities. 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        Speech: Speech is delayed and slurred.        Behavior: Behavior is slowed.        Cognition and Memory: Cognition is impaired.    Assessment/Plan: 1.  Functional deficits which require 3+ hours per day of interdisciplinary therapy in a comprehensive inpatient rehab setting.  Physiatrist is providing close team supervision and 24 hour management of active medical problems listed below.  Physiatrist and rehab team continue to assess barriers to discharge/monitor patient progress toward functional and medical goals  Care Tool:  Bathing    Body parts bathed by patient: Chest,Abdomen,Face,Right arm,Left arm,Right upper leg,Left upper leg   Body parts bathed by helper: Right lower leg,Left lower leg Body parts n/a: Buttocks,Front perineal area (pt previously cleansed)   Bathing assist Assist Level: 2 Helpers     Upper Body Dressing/Undressing Upper body dressing   What is the patient wearing?: Pull over shirt    Upper body assist Assist Level: Minimal Assistance - Patient > 75%    Lower Body Dressing/Undressing Lower body dressing      What is the patient wearing?: Incontinence brief,Pants     Lower body assist Assist for lower body dressing: Maximal Assistance - Patient 25 - 49%     Toileting Toileting Toileting Activity did not occur (Clothing management and hygiene only): N/A (no void or bm)  Toileting assist Assist for toileting: Total Assistance - Patient < 25%     Transfers Chair/bed transfer  Transfers assist  Chair/bed transfer activity did not occur: Safety/medical concerns  Chair/bed transfer assist level: 2 Helpers (stand pivot three muskateers technique)     Locomotion Ambulation   Ambulation assist      Assist level: 2 helpers  Max distance: 8'   Walk 10 feet activity   Assist  Walk 10 feet activity did not occur: Safety/medical concerns        Walk 50 feet activity   Assist Walk 50 feet with 2 turns activity did not occur: Safety/medical concerns         Walk 150 feet activity   Assist Walk 150 feet activity did not occur: Safety/medical concerns         Walk 10 feet  on uneven surface  activity   Assist Walk 10 feet on uneven surfaces activity did not occur: Safety/medical concerns         Wheelchair     Assist Will patient use wheelchair at discharge?: Yes Type of Wheelchair: Manual    Wheelchair assist level: Supervision/Verbal cueing Max wheelchair distance: 165ft    Wheelchair 50 feet with 2 turns activity    Assist        Assist Level: Supervision/Verbal cueing   Wheelchair 150 feet activity     Assist      Assist Level: Supervision/Verbal cueing   Blood pressure (!) 151/89, pulse 91, temperature 98.6 F (37 C), resp. rate 18, height 5\' 8"  (1.727 m), weight 69.7 kg, SpO2 97 %.  Medical Problem List and Plan: 1.  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             -patient may shower             -ELOS/Goals: 1/8             Continue CIR 2.  Antithrombotics: -DVT/anticoagulation: Lovenox             -antiplatelet therapy: Aspirin 81 mg daily and Plavix 75 mg daily x3 months then aspirin daily 3. Pain Management: Continue Tylenol as needed 4. Mood: Provide emotional support             -antipsychotic agents: N/A 5. 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 Skin/Wound Care: Routine skin checks. May d/c OV 7. Fluids/Electrolytes/Nutrition: Routine and outs.             12/20 electrolytes stable- repeat in 1 week.  8.  Hypertension.  Well controlled, continue Norvasc 5 mg daily.                   Vitals:   01/16/20 1945 01/17/20 0457  BP: 136/89 (!) 151/89  Pulse: 96 91  Resp: 17 18  Temp: 98.6 F (37 C) 98.6 F (37 C)  SpO2: 97% 97%  some lability will cont to monitor   9.  Hyperlipidemia.  Lipitor 10.  BPH.  Continue Flomax 0.4 mg daily.               PVRs ordered 11.  Severe sepsis with acute enteritis.  Completed course of  azithromycin.  Monitor hydration. 12.  AKI: resolved. Repeat Cr in 1 week.  13. Dysphagia: D2 nectar thick, MBS 12/20.  14. Nausea: continue zofran prn  15.  Constipation increase Senna S  LOS: 8 days A FACE TO FACE EVALUATION WAS PERFORMED  1/21 01/17/2020, 10:04 AM

## 2020-01-17 NOTE — Progress Notes (Signed)
Physical Therapy Weekly Progress Note  Patient Details  Name: Alexander Byrd MRN: [id number] Date of Birth: 06/03/1950  Beginning of progress report period: January 10, 2020 End of progress report period: January 17, 2020  Today's Date: 01/17/2020 PT Individual Time: 1105-1200 1410-1483 PT Individual Time Calculation (min): 55 min and 28 min   Patient has met 3 of 4 short term goals.  Pt making slow progress towards towards LTG of min assist overall. Mild improvement of midline orientation. Able to sit with supervision assist intermittently. Squat pivot transfer to [facility] with min assist. Gait training with [place] walker and mod assist and +2 assist with 63msketeer assist due to poor midline orientation and LLE>RLE ataxia.   Patient continues to demonstrate the following deficits muscle weakness and muscle joint tightness, decreased cardiorespiratoy endurance, unbalanced muscle activation, decreased coordination and decreased motor planning, decreased attention, decreased awareness, decreased problem solving, decreased safety awareness, decreased memory and delayed processing and decreased sitting balance, decreased standing balance, decreased postural control, hemiplegia and decreased balance strategies and therefore will continue to benefit from skilled PT intervention to increase functional independence with mobility.  Patient progressing toward long term goals..  Continue plan of care.  PT Short Term Goals Week 1:  PT Short Term Goal 1 (Week 1): Pt will perform swing pivot transfer with min assist PT Short Term Goal 1 - Progress (Week 1): Met PT Short Term Goal 2 (Week 1): Pt will perform bed mobility with CGA PT Short Term Goal 2 - Progress (Week 1): Progressing toward goal PT Short Term Goal 3 (Week 1): Pt will ambulate with max assist of 1 and LRAD PT Short Term Goal 3 - Progress (Week 1): Met PT Short Term Goal 4 (Week 1): pt will demonstrate upright posture at bedside and maintain  with dynamic movements PT Short Term Goal 4 - Progress (Week 1): Met Week 2:  PT Short Term Goal 1 (Week 2): [facility] Short Term Goal 2 (Week 2): Pt will maintain standing balance with BUE support up to 1 min and mod assist PT Short Term Goal 3 (Week 2): Pt will ambulate 379fwith RW and mod assist of 1 PT Short Term Goal 4 (Week 2): pt will toerate sitting up in WC up to 2 hours between therapy session  Skilled Therapeutic Interventions/Progress Updates:  Session 1  Pt received supine in bed and agreeable to PT. Bed level lower body dressing with max assist fort time management.  Supine>sit transfer with mod assist to achieve neutral pelvic positioning once siting up at EOB. [facility]. [facility] to nustep. 3 min+4 min at level 3-4 and min cues for midline orientation. Squat pivot [facility] and moderate cues for set up. Sit<>stand with EVA walker and mod assist from PT to prevent posterior LOB. Gait training with eva walker 2 x 257fnd min-mod assist from PT as well as moderate cues for step length/midline orientation/and improved step width. No LOB noted with UE support through elbows. Sit<>stand in parallel bars with min assist x 3. Reciprocal stepping with BLE 2 x 5 with cues for midline and improved terminal knee extension on the LLE. WC mobility through hall with supervision assist x [id number] only min cues for safety wiyth doorway management. Pt returned to room and performed squat pivot transfer to bed with min assist. Sit>supine completed with  min assist and control of the LLE and left supine in bed with call bell in reach and all needs met.   session2   Pt with NT for hygiene. Returned in 10 min and agreeable to PT. Bed mobility with min assist to come to sitting EOB. Sit<>stand in stedy with supervision assist and min cues for improved weight shift to the R.  Standing tolerance/balance while in stedy to perform horse shoe toss to target; alternating R and L UE to perform lateral reach to the R and the L and then returned to midline.    Stand>sit transfer to bed with stedy. Sit>supine completed with min assist, and left supine in bed with call bell in reach and all needs met.       Therapy Documentation Precautions:  Precautions Precautions: Fall Precaution Comments: R>L hemi, R inattention. Poor postural control. Restrictions Weight Bearing Restrictions: No Pain: denies  Therapy/Group: Individual Therapy  [name] 01/17/2020, 11:30 AM

## 2020-01-17 NOTE — Plan of Care (Signed)
°  Problem: Consults Goal: RH STROKE PATIENT EDUCATION Description: See Patient Education module for education specifics Outcome: Progressing   Problem: RH BOWEL ELIMINATION Goal: RH STG MANAGE BOWEL WITH ASSISTANCE Description: STG Manage Bowel with Assistance. Min assist Outcome: Progressing Goal: RH STG MANAGE BOWEL W/MEDICATION W/ASSISTANCE Description: STG Manage Bowel with Medication with Assistance.Min assist Outcome: Progressing   Problem: RH BLADDER ELIMINATION Goal: RH STG MANAGE BLADDER WITH ASSISTANCE Description: STG Manage Bladder With Assistance Min Outcome: Progressing   Problem: RH SAFETY Goal: RH STG ADHERE TO SAFETY PRECAUTIONS W/ASSISTANCE/DEVICE Description: STG Adhere to Safety Precautions With Assistance/Device. Min Outcome: Progressing   Problem: RH Vision Goal: RH LTG Vision (Specify) Outcome: Progressing   Problem: RH KNOWLEDGE DEFICIT Goal: RH STG INCREASE KNOWLEDGE OF HYPERTENSION Description: Patient and family will be able to manage HTN with medications and dietary modifications using handouts and educational resources with cues/reminders Outcome: Progressing Goal: RH STG INCREASE KNOWLEDGE OF STROKE PROPHYLAXIS Description: Patient and family will be able to manage secondary stroke risks with medications and dietary modifications using handouts and educational resources with cues/reminders Outcome: Progressing

## 2020-01-17 NOTE — Progress Notes (Signed)
Occupational Therapy Session Note  Patient Details  Name: Alexander Byrd MRN: [id number] Date of Birth: [birth year]  Today's Date: 01/17/2020 OT Individual Time: 1445-1530 OT Individual Time Calculation (min): 45 min   Short Term Goals: Week 1:  OT Short Term Goal 1 (Week 1): Pt will sit to EOB with mod A. OT Short Term Goal 2 (Week 1): Pt will maintain static sit at EOB with min A to engage in self care. OT Short Term Goal 3 (Week 1): Pt will be able to bathe both arms with CGA. OT Short Term Goal 4 (Week 1): Pt will transfer to [facility] with max A.  Skilled Therapeutic Interventions/Progress Updates:    Patient greeted semi-reclined in bed and agreeable to OT treatment session. Pt completed bed mobility with mod A. Max A squat-pivot to wc on R side. Worked on problem solving and fine motor control with graded pipe tree puzzle. Mod verbal cues to locate correct pieces. OT issued soft red theraputty and worked on functional grasp and pinch with R hand. Pt returned to room and completed mod A squat-pivot back to bed on stronger L side. Pt left semi-reclined in bed with bed alarm on, call bell in reach, and needs met.   Therapy Documentation Precautions:  Precautions Precautions: Fall Precaution Comments: R>L hemi, R inattention. Poor postural control. Restrictions Weight Bearing Restrictions: No Pain:  denies pain  Therapy/Group: Individual Therapy  Valma Cava 01/17/2020, 3:32 PM

## 2020-01-18 ENCOUNTER — Inpatient Hospital Stay (HOSPITAL_COMMUNITY): Payer: Medicare Other | Admitting: Physical Therapy

## 2020-01-18 ENCOUNTER — Inpatient Hospital Stay (HOSPITAL_COMMUNITY): Payer: Medicare Other | Admitting: Occupational Therapy

## 2020-01-18 ENCOUNTER — Inpatient Hospital Stay (HOSPITAL_COMMUNITY): Payer: Medicare Other | Admitting: Speech Pathology

## 2020-01-18 NOTE — Progress Notes (Signed)
Physical Therapy Session Note  Patient Details  Name: Alexander Byrd MRN: [id number] Date of Birth: 11/18/1950  Today's Date: 01/18/2020 PT Individual Time: 1050-1200 PT Individual Time Calculation (min): 70 min   Short Term Goals: Week 2:  PT Short Term Goal 1 (Week 2): Pt will trasnfer to [facility] with min assist PT Short Term Goal 2 (Week 2): Pt will maintain standing balance with BUE support up to 1 min and mod assist PT Short Term Goal 3 (Week 2): Pt will ambulate 35f with RW and mod assist of 1 PT Short Term Goal 4 (Week 2): pt will toerate sitting up in WC up to 2 hours between therapy session  Skilled Therapeutic Interventions/Progress Updates: Pt presented in bed sleeping but easily aroused and agreeable to therapy. Pt denies pain during session. PTA donned TED hose total and pt performed supine to sit with modA and use of bed features. PTA then threaded pants and donned grip socks total A for time management and energy conservation. [place] for upright/erect posture to allow PTA to pull pants over hips. Pt then transferred to w/c and pt propelled w/c with BUE and supervision to day room. Pt participated in Christmas sing along with pt swaying, tapping feet and intermittently singing to songs. Pt also performed STS and bouts of standing ~ 1-1:30 in [place] to promote upright posture. Pt required cues for anterior translation of hips which he was able to correct with verbal cues to "stand straight".  On longer standing with fatigue PTA providing facilitation at L knee and hips for TKE and erect posture. After sing-along pt transported to ortho gym and participated in tracing program on BITS at w/c level. Pt performed x 3 bouts of tracing letters "6, 8, and 0" respectively, each with 1 minute parameter. Pt with 61%, 28%, and 46% coverage respectively. Pt transported back to room at end of session and agreeable to remain in w/c as lunch will be served shortly. Pt left  in w/c at end of session with belt alarm on, call bell within reach and needs met.      Therapy Documentation Precautions:  Precautions Precautions: Fall Precaution Comments: R>L hemi, R inattention. Poor postural control. Restrictions Weight Bearing Restrictions: No General:   Vital Signs:     Therapy/Group: Individual Therapy  Mourad Cwikla  Venus Gilles, PTA  01/18/2020, 12:49 PM

## 2020-01-18 NOTE — Progress Notes (Signed)
Occupational Therapy Weekly Progress Note  Patient Details  Name: Alexander Byrd MRN: [id number] Date of Birth: December 14, 1950  Beginning of progress report period: January 10, 2020 End of progress report period: January 18, 2020  Today's Date: 01/18/2020 OT Individual Time: 7124-5809 OT Individual Time Calculation (min): 59 min    Patient has met 3 of 4 short term goals.  Alexander Byrd is making slow but steady progress with OT at this time.  Currently, he continues to need mod assist for static sitting balance and max assist for dynamic sitting balance secondary to increased posterior lean as well as pushing to the left side in sitting.  He is able to complete UB selfcare tasks in supported sitting from the wheelchair at min assist overall.  LB bathing and dressing are at max assist sit to stand secondary to not being able to efficiently reach his feet for dressing tasks as well as needing max assist for standing.  With functional movements overall he continues to demonstrate slower processing and execution compared to normal baseline, such as observed with washing up or donning clothing.  Decreased spatial perception is still present as well as he pushes hard to the left and maintains flexed trunk and flexed left knee in standing.  Feel overall his progress will likely be slower than originally expected secondary to these pusher tendencies, and slower initiation and cognitive processing.  Recommend continued CIR level therapy to work on reaching established LTGs set at min to mod assist overall.    Patient continues to demonstrate the following deficits: muscle weakness and muscle paralysis, impaired timing and sequencing, unbalanced muscle activation and decreased coordination, decreased midline orientation, decreased initiation, decreased awareness, decreased problem solving, decreased safety awareness, decreased memory and delayed processing and decreased sitting balance, decreased standing  balance, decreased postural control, hemiplegia and decreased balance strategies and therefore will continue to benefit from skilled OT intervention to enhance overall performance with BADL and Reduce care partner burden.  Patient progressing toward long term goals..  Continue plan of care.  OT Short Term Goals Week 2:  OT Short Term Goal 1 (Week 2): Pt will maintain static sit at EOB with min A to engage in self care. OT Short Term Goal 2 (Week 2): Pt will complete sit to stand at the sink with LB bathing tasks and mod assist for two consecutive sessions. OT Short Term Goal 3 (Week 2): Pt will complete stand pivot transfers to the [facility] with mod assist. OT Short Term Goal 4 (Week 2): Pt will donn brief and pants during LB dressing with overall mod assist sit to stand.  Skilled Therapeutic Interventions/Progress Updates:    Pt worked on bathing and dressing during session.  He needed mod assist for transfer from supine to sit EOB and then he completed stand pivot transfer to the left to the wheelchair with max assist.  He worked on bathing and dressing tasks at the sink with supervision and min instructional cueing to complete UB bathing.  He was able to complete LB bathing at max assist sit to stand with increased pushing to the left noted in standing as well as increased left knee and trunk flexion.  He was able to donn a pullover shirt with min assist but needed max assist to place his LEs in the brief and pants secondary to not being able to reach down while lifting his LEs.  Max assist for standing to pull them over his hips as well with the same posturing as mentioned  before.  Therapist provided total assist for donning socks secondary to decreased time.  Finished session with squat pivot transfer to the bed per pt request at mod assist level.  Call button and phone in reach with safety belt in place.      Therapy Documentation Precautions:  Precautions Precautions: Fall Precaution Comments: R>L  hemi, R inattention. Poor postural control. Restrictions Weight Bearing Restrictions: No  Pain: Pain Assessment Pain Scale: Faces Pain Score: 0-No pain ADL: See Care Tool Section for some details of mobility and selfcare  Therapy/Group: Individual Therapy  Hashir Deleeuw OTR/L 01/18/2020, 4:08 PM

## 2020-01-18 NOTE — Progress Notes (Signed)
[facility] PHYSICAL MEDICINE & REHABILITATION PROGRESS NOTE   Subjective/Complaints:  No issues overnight .  No pains, some cough with eating   ROS: Denies pain, denies nausea vomiting diarrhea constipation  Objective:   No results found. No results for input(s): WBC, HGB, HCT, PLT in the last 72 hours. No results for input(s): NA, K, CL, CO2, GLUCOSE, BUN, CREATININE, CALCIUM in the last 72 hours.  Intake/Output Summary (Last 24 hours) at 01/18/2020 0945 Last data filed at 01/18/2020 5102 Gross per 24 hour  Intake 956.01 ml  Output 100 ml  Net 856.01 ml        Physical Exam: Vital Signs Blood pressure (!) 152/98, pulse 89, temperature 97.7 F (36.5 C), temperature source Oral, resp. rate 16, height 5\' 8"  (1.727 m), weight 69.7 kg, SpO2 98 %.   General: No acute distress Mood and affect are appropriate Heart: Regular rate and rhythm no rubs murmurs or extra sounds Lungs: Clear to auscultation, breathing unlabored, no rales or wheezes Abdomen: Positive bowel sounds, soft nontender to palpation, nondistended Extremities: No clubbing, cyanosis, or edema Skin: No evidence of breakdown, no evidence of rash Motor 4/5 in BUE and BLE   Cerebellar exam mod dysmetria finger to nose to finger as well as heel to shin inLeft  upper and lower extremities Musculoskeletal: Full range of motion in all 4 extremities. 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        Speech: Speech is delayed and slurred.        Behavior: Behavior is slowed.        Cognition and Memory: Cognition is impaired.    Assessment/Plan: 1. Functional deficits which require 3+ hours per day of interdisciplinary therapy in a comprehensive inpatient rehab setting.  Physiatrist is providing close team supervision and 24 hour management of active medical problems listed below.  Physiatrist and rehab team continue to  assess barriers to discharge/monitor patient progress toward functional and medical goals  Care Tool:  Bathing    Body parts bathed by patient: Chest,Abdomen,Face,Right arm,Left arm,Right upper leg,Left upper leg   Body parts bathed by helper: Right lower leg,Left lower leg Body parts n/a: Buttocks,Front perineal area (pt previously cleansed)   Bathing assist Assist Level: 2 Helpers     Upper Body Dressing/Undressing Upper body dressing   What is the patient wearing?: Pull over shirt    Upper body assist Assist Level: Minimal Assistance - Patient > 75%    Lower Body Dressing/Undressing Lower body dressing      What is the patient wearing?: Incontinence brief,Pants     Lower body assist Assist for lower body dressing: Maximal Assistance - Patient 25 - 49%     Toileting Toileting Toileting Activity did not occur (Clothing management and hygiene only): N/A (no void or bm)  Toileting assist Assist for toileting: Total Assistance - Patient < 25%     Transfers Chair/bed transfer  Transfers assist  Chair/bed transfer activity did not occur: Safety/medical concerns  Chair/bed transfer assist level: 2 Helpers (stand pivot three muskateers technique)     Locomotion Ambulation   Ambulation assist      Assist level: 2 helpers   Max distance: 8'   Walk 10 feet activity   Assist  Walk 10 feet activity did not occur: Safety/medical concerns        Walk 50 feet activity  Assist Walk 50 feet with 2 turns activity did not occur: Safety/medical concerns         Walk 150 feet activity   Assist Walk 150 feet activity did not occur: Safety/medical concerns         Walk 10 feet on uneven surface  activity   Assist Walk 10 feet on uneven surfaces activity did not occur: Safety/medical concerns         Wheelchair     Assist Will patient use wheelchair at discharge?: Yes Type of Wheelchair: Manual    Wheelchair assist level:  Supervision/Verbal cueing Max wheelchair distance: 178ft    Wheelchair 50 feet with 2 turns activity    Assist        Assist Level: Supervision/Verbal cueing   Wheelchair 150 feet activity     Assist      Assist Level: Supervision/Verbal cueing   Blood pressure (!) 152/98, pulse 89, temperature 97.7 F (36.5 C), temperature source Oral, resp. rate 16, height 5\' 8"  (1.727 m), weight 69.7 kg, SpO2 98 %.  Medical Problem List and Plan: 1.  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             -patient may shower             -ELOS/Goals: 02/02/2020             Continue CIR 2.  Antithrombotics: -DVT/anticoagulation: Lovenox             -antiplatelet therapy: Aspirin 81 mg daily and Plavix 75 mg daily x3 months then aspirin daily 3. Pain Management: Continue Tylenol as needed 4. Mood: Provide emotional support             -antipsychotic agents: N/A 5. 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 Skin/Wound Care: Routine skin checks. May d/c OV 7. Fluids/Electrolytes/Nutrition: Routine and outs.             12/20 electrolytes stable- repeat in 1 week.  8.  Hypertension.  Well controlled, continue Norvasc 5 mg daily.                   Vitals:   01/18/20 0454 01/18/20 0848  BP: 128/87 (!) 152/98  Pulse: 89   Resp: 16   Temp: 97.7 F (36.5 C)   SpO2: 98%   occ elevation   9.  Hyperlipidemia.  Lipitor 10.  BPH.  Continue Flomax 0.4 mg daily.               PVRs ordered 11.  Severe sepsis with acute enteritis.  Completed course of azithromycin.  Monitor hydration. 12.  AKI: resolved. Repeat Cr in 1 week.  13. Dysphagia: D2 nectar thick, MBS 12/20.  14. Nausea: continue zofran prn  15.  Constipation increase Senna S  LOS: 9 days A FACE TO FACE EVALUATION WAS PERFORMED  1/21 01/18/2020, 9:45 AM

## 2020-01-18 NOTE — Progress Notes (Signed)
Speech Language Pathology Daily Session Note  Patient Details  Name: Alexander Byrd MRN: [id number] Date of Birth: February 10, 1950  Today's Date: 01/18/2020 SLP Individual Time: 1884-1660 SLP Individual Time Calculation (min): 55 min  Short Term Goals: Week 2: SLP Short Term Goal 1 (Week 2): Pt will demonstrate efficient masticaiton and oral clearance of Dys 3 textures with no more than Min A cues for swallow strategies prior to upgrade. SLP Short Term Goal 2 (Week 2): Pt will consume trials of ice chips and thin H2O with minimal overt s/sx aspiration prior to repeating his MBSS to assess advancement potential (from nectar thick liquids). SLP Short Term Goal 3 (Week 2): Pt will sustain his attention to functional tasks for 10-15 minute intervals with Min A cues for redirection. SLP Short Term Goal 4 (Week 2): Pt will recall new/daily information with Min A for use of strategies/aids. SLP Short Term Goal 5 (Week 2): Pt will demonstrate awareness of functional errors with Min A verbal/visual cues. SLP Short Term Goal 6 (Week 2): Pt will demonstrate to functionally problem solving basic to semi-complex situatoins with Min A cues.  Skilled Therapeutic Interventions: Pt was seen for skilled ST targeting dysphagia and cognitive goals. SLP facilitated session with upgraded trials of ice chips after oral care via suction toothbrush. Pt exhibited strong cough after first ice chip presentation, suspect mainly due to poor bolus awareness, as pt was distracted by trying to manipulate the controls on voice amplifier. Once redirected, 2 additional subtle throat clears noted across intake of total 15 ice chips. Recommend continue current diet and ST will continue to assess readiness for repeat MBSS. Pt received a phone call on room phone during session, during which SLP provided Mod faded to Min A for initiation and problem solving functional use of telephone. SLP further facilitated session with overall Mod A for  error awareness and Min A for problem solving when interpreting a basic weekly weather forecast. When MD arrived, pt engaged in conversation and answered questions about his current status appropriately with no more than Min A cues and extra time for processing. His verbal responses were actually quite timely. Pt left laying in bed with alarm set and needs within reach. Continue per current plan of care.      Pain Pain Assessment Pain Scale: 0-10 Pain Score: 0-No pain  Therapy/Group: Individual Therapy  Alexander Byrd 01/18/2020, 7:25 AM

## 2020-01-20 ENCOUNTER — Inpatient Hospital Stay (HOSPITAL_COMMUNITY): Payer: Medicare Other | Admitting: Physical Therapy

## 2020-01-20 ENCOUNTER — Inpatient Hospital Stay (HOSPITAL_COMMUNITY): Payer: Medicare Other | Admitting: Occupational Therapy

## 2020-01-20 MED ORDER — BISACODYL 10 MG RE SUPP: 10.0000 mg | Freq: Every day | RECTAL | Status: DC | PRN

## 2020-01-20 NOTE — Progress Notes (Addendum)
Occupational Therapy Session Note  Patient Details  Name: Dillen Belmontes MRN: [id number] Date of Birth: February 10, 1950  Today's Date: 01/20/2020 OT Individual Time: 0102-7253 OT Individual Time Calculation (min): 56 min    Short Term Goals: Week 2:  OT Short Term Goal 1 (Week 2): Pt will maintain static sit at EOB with min A to engage in self care. OT Short Term Goal 2 (Week 2): Pt will complete sit to stand at the sink with LB bathing tasks and mod assist for two consecutive sessions. OT Short Term Goal 3 (Week 2): Pt will complete stand pivot transfers to the [facility] with mod assist. OT Short Term Goal 4 (Week 2): Pt will donn brief and pants during LB dressing with overall mod assist sit to stand.  Skilled Therapeutic Interventions/Progress Updates:    Pt greeted in bed, hearing amplifiers donned at start of session. He reported no pain and did not want to try using the restroom. Pt agreeable to start session by eating lunch. Mod A for supine<sit and we then worked on postural control while sitting EOB to eat. Pt requiring Min-Mod A 90% of the time for balance due to Lt posterior LOBs, requiring manual and verbal cuing for neutral upright while engaged in functional activity. He required assist and cuing for small bites. No s/s aspiration during meal, nectar thickened water consumed via spoon per swallowing strategies. Afterwards he wanted to brush his teeth. Mod A for squat pivot<w/c, going towards the Lt side. Cues provided to use the mirror for visual feedback in regards to neutral midline with pt doing well correcting his balance as he tended to lean towards the Lt side. Vcs for scanning to the Rt for needed items during oral care and face washing, pt requiring increased time to process instruction and execute motor demands of tasks. Mod A for squat pivot<bed going towards the Rt side and Mod A to transition to supine. Pt remained in bed at close of session, left with all needs within reach and  bed alarm set.   Therapy Documentation Precautions:  Precautions Precautions: Fall Precaution Comments: R>L hemi, R inattention. Poor postural control. Restrictions Weight Bearing Restrictions: No Vital Signs: Therapy Vitals Temp: 98.6 F (37 C) Pulse Rate: 93 Resp: 17 BP: (!) 135/93 Patient Position (if appropriate): Lying Oxygen Therapy SpO2: 97 % O2 Device: Room Air ADL: ADL Eating: Minimal assistance Grooming: Moderate assistance Upper Body Bathing: Moderate assistance Where Assessed-Upper Body Bathing: Bed level Lower Body Bathing: Maximal assistance Where Assessed-Lower Body Bathing: Bed level Upper Body Dressing: Moderate assistance Where Assessed-Upper Body Dressing: Bed level Lower Body Dressing: Maximal assistance Where Assessed-Lower Body Dressing: Bed level Toilet Transfer: Not assessed      Therapy/Group: Individual Therapy  Christalynn Boise A Haidee Stogsdill 01/20/2020, 2:30 PM

## 2020-01-20 NOTE — Progress Notes (Signed)
Physical Therapy Session Note  Patient Details  Name: Alexander Byrd MRN: [id number] Date of Birth: 05-Oct-1950  Today's Date: 01/20/2020 PT Individual Time: 1640-1700 PT Individual Time Calculation (min): 20 min   Short Term Goals: Week 2:  PT Short Term Goal 1 (Week 2): Pt will trasnfer to [facility] with min assist PT Short Term Goal 2 (Week 2): Pt will maintain standing balance with BUE support up to 1 min and mod assist PT Short Term Goal 3 (Week 2): Pt will ambulate 27f with RW and mod assist of 1 PT Short Term Goal 4 (Week 2): pt will toerate sitting up in WC up to 2 hours between therapy session  Skilled Therapeutic Interventions/Progress Updates:   Pt received supine in bed and agreeable to PT. Supine>sit transfer with min assist and cues for midline. Reciprocal scooting to EOB with min assist and mod cues for sequencing. Pt performed LAQ x 10 BLE with supervision assist for sitting balance EOB with BUE supported on mattress. Sit<>stand x 5 throughout session with min assist and min cues to push from bed with RUE and force improved WB over the RLE. Standing balance 3 x 10-15 sec with min-mod assist for improve terminal knee extension on the L and improve weight shift the R. Lateral reach to the R with RUE to improve trunk extension ands weight shift to the R x 15 with increasing resistance to Weight shift with fatigue. Sit>supine completed with min assist at the LLE, and left supine in bed with call bell in reach and all needs met.  Family present in session with questions regaurding d/c plan. Family expressed concerns with d/c due to pt's current functional status and his role as care giver for SO. PT recommended possible SNF placement if covered by insurance to improve long term functional outcomes and safety.       Therapy Documentation Precautions:  Precautions Precautions: Fall Precaution Comments: R>L hemi, R inattention. Poor postural control. Restrictions Weight Bearing  Restrictions: No General: PT Amount of Missed Time (min): 10 Minutes PT Missed Treatment Reason: Patient fatigue Vital Signs: Therapy Vitals Temp: 98.6 F (37 C) Pulse Rate: 93 Resp: 17 BP: (!) 135/93 Patient Position (if appropriate): Lying Oxygen Therapy SpO2: 97 % O2 Device: Room Air Pain:   denies   Therapy/Group: Individual Therapy  ALorie Phenix12/26/2021, 5:00 PM

## 2020-01-20 NOTE — Progress Notes (Signed)
Physical Therapy Session Note  Patient Details  Name: Alexander Byrd MRN: [id number] Date of Birth: 08/12/1950  Today's Date: 01/20/2020 PT Individual Time: 0902-0958 PT Individual Time Calculation (min): 56 min   Short Term Goals: Week 2:  PT Short Term Goal 1 (Week 2): Pt will trasnfer to [facility] with min assist PT Short Term Goal 2 (Week 2): Pt will maintain standing balance with BUE support up to 1 min and mod assist PT Short Term Goal 3 (Week 2): Pt will ambulate 60ft with RW and mod assist of 1 PT Short Term Goal 4 (Week 2): pt will toerate sitting up in WC up to 2 hours between therapy session  Skilled Therapeutic Interventions/Progress Updates:   Pt received supine in bed, requesting to use restroom. Pt about to void bladder. Pt then agreeable to PT. Supine>sit transfer with min assist at trunk to achieve midline once EOB. Squat pivot transfer to [facility] with min assist and moderate cues for UE placement to improve safety. Pt transported to rehab gym in [facility]. Pt performed gait training with eva walker to stabilize torso 2 x 63ft; visual and physical facilitation of board on floor to force increased step width and force improved Weight shift the R and normalize midline in stance. Forward/reverse gait with eva walker 2 x 59ft with mod assist from PT guide AD. Moderate cues for R weight shift and improved step length R and L. Pt performed sit<>stand with RW x 5 with min-mod assist for proper UE placement and to facilitate anterior weight shift. Reciprocal tap on 2 targets for coordination training 2x 5 BLE with cues to improved extension on the RLE/RUE to prevent lateral weight shift L. Pt performed dynamic WC mobility through rehab gym to weave through 8 cones with supervision assist and moderate cues for improved turning radius to prevent hitting cones on R and L. Additional WC mobility through hall x 177ft with supervision assist. Pt returned to room and performed stand pivot transfer to bed with RW  and mod assist and midline orientation and AD management. Sit>supine completed with min assist at LLE, and left supine in bed with call bell in reach and all needs met.        Therapy Documentation Precautions:  Precautions Precautions: Fall Precaution Comments: R>L hemi, R inattention. Poor postural control. Restrictions Weight Bearing Restrictions: No    Pain: denies   Therapy/Group: Individual Therapy  [name] 01/20/2020, 9:58 AM

## 2020-01-20 NOTE — Progress Notes (Signed)
[place] PHYSICAL MEDICINE & REHABILITATION PROGRESS NOTE   Subjective/Complaints:  No complaints this morning, patient tolerating therapy  ROS: Denies pain, denies nausea vomiting diarrhea constipation  Objective:   No results found. No results for input(s): WBC, HGB, HCT, PLT in the last 72 hours. No results for input(s): NA, K, CL, CO2, GLUCOSE, BUN, CREATININE, CALCIUM in the last 72 hours.  Intake/Output Summary (Last 24 hours) at 01/20/2020 1143 Last data filed at 01/20/2020 0342 Gross per 24 hour  Intake 535.75 ml  Output --  Net 535.75 ml        Physical Exam: Vital Signs Blood pressure 134/89, pulse 95, temperature 98.2 F (36.8 C), resp. rate 16, height 5\' 7"  (1.702 m), weight 69.2 kg, SpO2 97 %.    General: No acute distress Mood and affect are appropriate Heart: Regular rate and rhythm no rubs murmurs or extra sounds Lungs: Clear to auscultation, breathing unlabored, no rales or wheezes Abdomen: Positive bowel sounds, soft nontender to palpation, nondistended Extremities: No clubbing, cyanosis, or edema Skin: No evidence of breakdown, no evidence of rash   Motor 4/5 in BUE and BLE   Cerebellar exam mod dysmetria finger to nose to finger as well as heel to shin inLeft  upper and lower extremities Musculoskeletal: Full range of motion in all 4 extremities. 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        Speech: Speech is delayed and slurred.        Behavior: Behavior is slowed.        Cognition and Memory: Cognition is impaired.    Assessment/Plan: 1. Functional deficits which require 3+ hours per day of interdisciplinary therapy in a comprehensive inpatient rehab setting.  Physiatrist is providing close team supervision and 24 hour management of active medical problems listed below.  Physiatrist and rehab team continue to assess barriers to  discharge/monitor patient progress toward functional and medical goals  Care Tool:  Bathing    Body parts bathed by patient: Chest,Abdomen,Face,Right arm,Left arm,Right upper leg,Left upper leg,Right lower leg   Body parts bathed by helper: Left lower leg,Buttocks,Front perineal area Body parts n/a: Buttocks,Front perineal area (pt previously cleansed)   Bathing assist Assist Level: Maximal Assistance - Patient 24 - 49%     Upper Body Dressing/Undressing Upper body dressing   What is the patient wearing?: Pull over shirt    Upper body assist Assist Level: Minimal Assistance - Patient > 75%    Lower Body Dressing/Undressing Lower body dressing      What is the patient wearing?: Incontinence brief,Pants     Lower body assist Assist for lower body dressing: Maximal Assistance - Patient 25 - 49%     Toileting Toileting Toileting Activity did not occur (Clothing management and hygiene only): N/A (no void or bm)  Toileting assist Assist for toileting: Total Assistance - Patient < 25%     Transfers Chair/bed transfer  Transfers assist  Chair/bed transfer activity did not occur: Safety/medical concerns  Chair/bed transfer assist level: Moderate Assistance - Patient 50 - 74% (squat pivot)     Locomotion Ambulation   Ambulation assist      Assist level: 2 helpers   Max distance: 8'   Walk 10 feet activity   Assist  Walk 10 feet activity did not occur: Safety/medical concerns        Walk 50 feet activity  Assist Walk 50 feet with 2 turns activity did not occur: Safety/medical concerns         Walk 150 feet activity   Assist Walk 150 feet activity did not occur: Safety/medical concerns         Walk 10 feet on uneven surface  activity   Assist Walk 10 feet on uneven surfaces activity did not occur: Safety/medical concerns         Wheelchair     Assist Will patient use wheelchair at discharge?: Yes Type of Wheelchair: Manual     Wheelchair assist level: Supervision/Verbal cueing Max wheelchair distance: 162ft    Wheelchair 50 feet with 2 turns activity    Assist        Assist Level: Supervision/Verbal cueing   Wheelchair 150 feet activity     Assist      Assist Level: Supervision/Verbal cueing   Blood pressure 134/89, pulse 95, temperature 98.2 F (36.8 C), resp. rate 16, height 5\' 7"  (1.702 m), weight 69.2 kg, SpO2 97 %.  Medical Problem List and Plan: 1.  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             -patient may shower             -ELOS/Goals: 02/02/2020             Continue CIR PT, OT, speech 2.  Antithrombotics: -DVT/anticoagulation: Lovenox             -antiplatelet therapy: Aspirin 81 mg daily and Plavix 75 mg daily x3 months then aspirin daily 3. Pain Management: Continue Tylenol as needed 4. Mood: Provide emotional support             -antipsychotic agents: N/A 5. 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 Skin/Wound Care: Routine skin checks. May d/c OV 7. Fluids/Electrolytes/Nutrition: Routine and outs.             12/20 electrolytes stable- repeat in 1 week.  8.  Hypertension.  Well controlled, continue Norvasc 5 mg daily.                   Vitals:   01/20/20 0436 01/20/20 1010  BP: 136/86 134/89  Pulse: 95   Resp: 16   Temp: 98.2 F (36.8 C)   SpO2: 97%   Well controlled 12/26  9.  Hyperlipidemia.  Lipitor 10.  BPH.  Continue Flomax 0.4 mg daily.               PVRs ordered 11.  Severe sepsis with acute enteritis.  Completed course of azithromycin.  Monitor hydration. 12.  AKI: resolved. Repeat Cr in 1 week.  13. Dysphagia: D2 nectar thick, MBS 12/20.  14. Nausea: continue zofran prn  15.  Constipation increase Senna S, last BMs 12/24, may need Dulcolax suppository tomorrow if no  BM  LOS: 11 days A FACE TO FACE EVALUATION WAS PERFORMED  1/25 01/20/2020, 11:43 AM

## 2020-01-21 ENCOUNTER — Inpatient Hospital Stay (HOSPITAL_COMMUNITY): Payer: Medicare Other

## 2020-01-21 ENCOUNTER — Inpatient Hospital Stay (HOSPITAL_COMMUNITY): Payer: Medicare Other | Admitting: Occupational Therapy

## 2020-01-21 ENCOUNTER — Inpatient Hospital Stay (HOSPITAL_COMMUNITY): Payer: Medicare Other | Admitting: Physical Therapy

## 2020-01-21 NOTE — Progress Notes (Signed)
Occupational Therapy Session Note  Patient Details  Name: Alexander Byrd MRN: [id number] Date of Birth: 1950/06/25  Today's Date: 01/21/2020 OT Individual Time: 2119-4174 OT Individual Time Calculation (min): 26 min   Short Term Goals: Week 2:  OT Short Term Goal 1 (Week 2): Pt will maintain static sit at EOB with min A to engage in self care. OT Short Term Goal 2 (Week 2): Pt will complete sit to stand at the sink with LB bathing tasks and mod assist for two consecutive sessions. OT Short Term Goal 3 (Week 2): Pt will complete stand pivot transfers to the [facility] with mod assist. OT Short Term Goal 4 (Week 2): Pt will donn brief and pants during LB dressing with overall mod assist sit to stand.  Skilled Therapeutic Interventions/Progress Updates:    Pt greeted in bed, used the hearing amplifiers for communication due to [facility]. He declined brushing his teeth or using the restroom to start session, no c/o pain. Pt was agreeable to don his pants. Checked his brief first with pt rolling to the Rt with Mod A. Pts brief was clean so supine<sit completed with Mod A, increased time, and vcs. Pt required Max A for sitting balance while he applied lotion to LEs and assisted with threading LEs into pants. Pt required manual, verbal, and visual/demonstrational cues to improve postural alignment. Once OT donned his gripper socks, sit<stand completed with Max A and no AD, pt with pusher tendencies, using the bedrail to push towards the Lt. Pt still with Lt pushing while elevating pants over hips given Min A for full elevation, Max balance assist and Lt knee blocked. He returned to EOB and was able to obtain neutral alignment during static sitting for about 30 seconds before Lt LOB. He requested to return to bed during fatigue. Pt did so with Min-Mod A. Left him in bed with all needs within reach and NT to supervise him for breakfast. Tx focus placed on postural control and ADL retraining.   Therapy  Documentation Precautions:  Precautions Precautions: Fall Precaution Comments: R>L hemi, R inattention. Poor postural control. Restrictions Weight Bearing Restrictions: No ADL: ADL Eating: Minimal assistance Grooming: Moderate assistance Upper Body Bathing: Moderate assistance Where Assessed-Upper Body Bathing: Bed level Lower Body Bathing: Maximal assistance Where Assessed-Lower Body Bathing: Bed level Upper Body Dressing: Moderate assistance Where Assessed-Upper Body Dressing: Bed level Lower Body Dressing: Maximal assistance Where Assessed-Lower Body Dressing: Bed level Toilet Transfer: Not assessed      Therapy/Group: Individual Therapy  [name] A Shizuye Rupert 01/21/2020, 12:24 PM

## 2020-01-21 NOTE — Progress Notes (Signed)
Speech Language Pathology Daily Session Note  Patient Details  Name: Alexander Byrd MRN: [id number] Date of Birth: 1950/08/28  Today's Date: 01/21/2020   SLP Individual Time: 1101-1201 SLP Individual Time Calculation (min): 60 min   SLP Individual Time: 1533-1600 SLP Individual Time Calculation (min): 27 min  Short Term Goals: Week 2: SLP Short Term Goal 1 (Week 2): Pt will demonstrate efficient masticaiton and oral clearance of Dys 3 textures with no more than Min A cues for swallow strategies prior to upgrade. SLP Short Term Goal 2 (Week 2): Pt will consume trials of ice chips and thin H2O with minimal overt s/sx aspiration prior to repeating his MBSS to assess advancement potential (from nectar thick liquids). SLP Short Term Goal 3 (Week 2): Pt will sustain his attention to functional tasks for 10-15 minute intervals with Min A cues for redirection. SLP Short Term Goal 4 (Week 2): Pt will recall new/daily information with Min A for use of strategies/aids. SLP Short Term Goal 5 (Week 2): Pt will demonstrate awareness of functional errors with Min A verbal/visual cues. SLP Short Term Goal 6 (Week 2): Pt will demonstrate to functionally problem solving basic to semi-complex situatoins with Min A cues.  Skilled Therapeutic Interventions: Session 1: Pt was seen for skilled ST targeting dysphagia and cognitive goals, pt room with low visual and auditory stim to reduce distractions during therapeutic tasks. Pt independently completing oral care prior to PO trials, requiring min A verbal cues to clean total oral cavity. SLP facilitated session by providing upgraded trials of ice chips, pt demonstrating generally functional oral and pharyngeal phase of swallow, no overt s/s aspiration with any trials. Pt consuming 3 oz Nectar thick liquids via tsp with occ mildly delayed swallow initiation, no overt s/s aspiration or change in vocal quality. Pt appears to have improved oropharyngeal swallow  function since admission/previous MBSS, likely close to readiness for repeat instrumental assessment. Pt response time in simple conversation only mildly delayed this date, very active in conversation and answering complex questions. Word retention task requiring Max fading to Mod A cues for 50% accuracy, pt would benefit from ongoing education/training in compensatory memory strategies/strategies for encoding novel information. Pt left lying in bed with alarm set and needs within reach.   Session 2:  Pt seen for ongoing tx of cognitive goals, pleasant and cooperative with all tasks. Pt and SLP initiating memory notebook to increase patient's understanding and carryover of 3 therapies, therapeutic activities, goals of therapies and other important daily events. SLP adding primary contact info and phone numbers for patient's sister and significant other. Pt expressing understanding and gratitude for memory notebook, staff is encouraged to complete daily. Pt left lying in bed with alarm set and all needs met. Cont ST POC.   Pain Pain Assessment Pain Scale: 0-10 Pain Score: 0-No pain  Therapy/Group: Individual Therapy  Dewaine Conger 01/21/2020, 11:39 AM

## 2020-01-21 NOTE — Progress Notes (Signed)
[facility] PHYSICAL MEDICINE & REHABILITATION PROGRESS NOTE   Subjective/Complaints: No complaints this morning Patient on the commode Tolerated OT  ROS: denies pain, denies nausea vomiting diarrhea constipation  Objective:   No results found. No results for input(s): WBC, HGB, HCT, PLT in the last 72 hours. No results for input(s): NA, K, CL, CO2, GLUCOSE, BUN, CREATININE, CALCIUM in the last 72 hours.  Intake/Output Summary (Last 24 hours) at 01/21/2020 1340 Last data filed at 01/21/2020 0900 Gross per 24 hour  Intake 540 ml  Output 175 ml  Net 365 ml        Physical Exam: Vital Signs Blood pressure 138/80, pulse 86, temperature 98 F (36.7 C), resp. rate 16, height 5\' 7"  (1.702 m), weight 69.2 kg, SpO2 96 %. Gen: no distress, normal appearing HEENT: oral mucosa pink and moist, NCAT Cardio: Reg rate Chest: normal effort, normal rate of breathing Abd: soft, non-distended Ext: no edema Skin: intact Motor 4/5 in BUE and BLE   Cerebellar exam mod dysmetria finger to nose to finger as well as heel to shin inLeft  upper and lower extremities Musculoskeletal: Full range of motion in all 4 extremities. 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        Speech: Speech is delayed and slurred.        Behavior: Behavior is slowed.        Cognition and Memory: Cognition is impaired.    Assessment/Plan: 1. Functional deficits which require 3+ hours per day of interdisciplinary therapy in a comprehensive inpatient rehab setting.  Physiatrist is providing close team supervision and 24 hour management of active medical problems listed below.  Physiatrist and rehab team continue to assess barriers to discharge/monitor patient progress toward functional and medical goals  Care Tool:  Bathing    Body parts bathed by patient: Chest,Abdomen,Face,Right arm,Left arm,Right upper leg,Left  upper leg,Right lower leg   Body parts bathed by helper: Left lower leg,Buttocks,Front perineal area Body parts n/a: Buttocks,Front perineal area (pt previously cleansed)   Bathing assist Assist Level: Maximal Assistance - Patient 24 - 49%     Upper Body Dressing/Undressing Upper body dressing   What is the patient wearing?: Pull over shirt    Upper body assist Assist Level: Minimal Assistance - Patient > 75%    Lower Body Dressing/Undressing Lower body dressing      What is the patient wearing?: Incontinence brief,Pants     Lower body assist Assist for lower body dressing: Maximal Assistance - Patient 25 - 49%     Toileting Toileting Toileting Activity did not occur (Clothing management and hygiene only): N/A (no void or bm)  Toileting assist Assist for toileting: Total Assistance - Patient < 25%     Transfers Chair/bed transfer  Transfers assist  Chair/bed transfer activity did not occur: Safety/medical concerns  Chair/bed transfer assist level: Moderate Assistance - Patient 50 - 74% (squat pivot)     Locomotion Ambulation   Ambulation assist      Assist level: 2 helpers   Max distance: 8'   Walk 10 feet activity   Assist  Walk 10 feet activity did not occur: Safety/medical concerns        Walk 50 feet activity   Assist Walk 50 feet with 2 turns activity did not occur: Safety/medical concerns         Walk 150 feet  activity   Assist Walk 150 feet activity did not occur: Safety/medical concerns         Walk 10 feet on uneven surface  activity   Assist Walk 10 feet on uneven surfaces activity did not occur: Safety/medical concerns         Wheelchair     Assist Will patient use wheelchair at discharge?: Yes Type of Wheelchair: Manual    Wheelchair assist level: Supervision/Verbal cueing Max wheelchair distance: 194ft    Wheelchair 50 feet with 2 turns activity    Assist        Assist Level: Supervision/Verbal  cueing   Wheelchair 150 feet activity     Assist      Assist Level: Supervision/Verbal cueing   Blood pressure 138/80, pulse 86, temperature 98 F (36.7 C), resp. rate 16, height 5\' 7"  (1.702 m), weight 69.2 kg, SpO2 96 %.  Medical Problem List and Plan: 1.  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             -patient may shower             -ELOS/Goals: 02/02/2020             Continue CIR PT, OT, speech 2.  Antithrombotics: -DVT/anticoagulation: Lovenox             -antiplatelet therapy: Aspirin 81 mg daily and Plavix 75 mg daily x3 months then aspirin daily 3. Pain Management: Continue Tylenol as needed 4. Mood: Provide emotional support             -antipsychotic agents: N/A 5. 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 Skin/Wound Care: Routine skin checks. May d/c OV 7. Fluids/Electrolytes/Nutrition: Routine and outs.             12/20 electrolytes stable 8.  Hypertension.  Well controlled, continue Norvasc 5 mg daily.                   Vitals:   01/20/20 1945 01/21/20 0534  BP: 131/84 138/80  Pulse: (!) 102 86  Resp: 16 16  Temp: 98.7 F (37.1 C) 98 F (36.7 C)  SpO2: 96% 96%  Well controlled 12/27- continue to monitor.   9.  Hyperlipidemia.  Lipitor 10.  BPH.  Continue Flomax 0.4 mg daily.               PVRs ordered 11.  Severe sepsis with acute enteritis.  Completed course of azithromycin.  Monitor hydration. 12.  AKI: resolved.  13. Dysphagia: D2 nectar thick, MBS 12/20.  14. Nausea: continue zofran prn  15.  Constipation increase Senna S, last BMs 12/24, may need Dulcolax suppository if no BM 16. Tachycardia: intermittent, continue to monitor.  LOS: 12 days A FACE TO FACE EVALUATION WAS PERFORMED  Alexander Byrd Alexander Byrd 01/21/2020, 1:40 PM

## 2020-01-21 NOTE — Progress Notes (Signed)
Physical Therapy Session Note  Patient Details  Name: Alexander Byrd MRN: [id number] Date of Birth: [birth year]  Today's Date: 01/21/2020 PT Individual Time: 1407-1505 PT Individual Time Calculation (min): 58 min   Short Term Goals: Week 2:  PT Short Term Goal 1 (Week 2): Pt will trasnfer to [facility] with min assist PT Short Term Goal 2 (Week 2): Pt will maintain standing balance with BUE support up to 1 min and mod assist PT Short Term Goal 3 (Week 2): Pt will ambulate 76ft with RW and mod assist of 1 PT Short Term Goal 4 (Week 2): pt will toerate sitting up in WC up to 2 hours between therapy session  Skilled Therapeutic Interventions/Progress Updates: Pt presented in bed agreeable to therapy. Pt denies pain during session. Performed supine to sit with modA and use of bed features with moderate posterior lean initially upon sitting. Pt required modA to scoot to EOB. Performed squat pivot to w/c with modA to L due to heavy anteriorlateral lean. Pt transported to ortho gym for energy conservation and participated in standing frame activities to promote trunk activation and improve standing posture. Pt participated in bouts in standing frame ~2-4 minutes each with pt performing various activities including placing horseshoes on mirror for high reaching, placing "x's" on mirror with LUE specifically crossing midline, and placing post-it notes at targeted spots on mirror. Pt required minA for increased reaching to for ends of mirror and for high reach. PTA also facilitated wt shifting to R to improve midline orientation. Pt provided with seated rest breaks between bouts with overall fair tolerance. Pt transported back to room at end of session and performed squat pivot to R with minA and required min/modA (due to posterior lean) to return to bed. Pt left in bed at end of session with bed alarm on, call bell within reach and needs met.        Therapy Documentation Precautions:  Precautions Precautions:  Fall Precaution Comments: R>L hemi, R inattention. Poor postural control. Restrictions Weight Bearing Restrictions: No General:   Vital Signs: Therapy Vitals Temp: 98.4 F (36.9 C) Temp Source: Oral Pulse Rate: 92 Resp: 18 BP: (!) 146/87 Patient Position (if appropriate): Lying Oxygen Therapy SpO2: 95 % O2 Device: Room Air Pain: Pain Assessment Pain Scale: 0-10 Pain Score: 0-No pain   Therapy/Group: Individual Therapy  Elga Santy  Niesha Bame, PTA  01/21/2020, 4:05 PM

## 2020-01-22 ENCOUNTER — Inpatient Hospital Stay (HOSPITAL_COMMUNITY): Payer: Medicare Other | Admitting: Speech Pathology

## 2020-01-22 ENCOUNTER — Inpatient Hospital Stay (HOSPITAL_COMMUNITY): Payer: Medicare Other

## 2020-01-22 ENCOUNTER — Inpatient Hospital Stay (HOSPITAL_COMMUNITY): Payer: Medicare Other | Admitting: Physical Therapy

## 2020-01-22 ENCOUNTER — Inpatient Hospital Stay (HOSPITAL_COMMUNITY): Payer: Medicare Other | Admitting: Occupational Therapy

## 2020-01-22 MED ORDER — CALCIUM CARBONATE ANTACID 500 MG PO CHEW
1.0000 | CHEWABLE_TABLET | Freq: Three times a day (TID) | ORAL | Status: DC | PRN
  Administered 2020-01-22 – 2020-01-31 (×4): 200 mg via ORAL
  Filled 2020-01-22 (×4): qty 1

## 2020-01-22 NOTE — Progress Notes (Signed)
Occupational Therapy Session Note  Patient Details  Name: Alexander Byrd MRN: [id number] Date of Birth: 08-17-1950  Today's Date: 01/22/2020 OT Individual Time: 4098-1191 OT Individual Time Calculation (min): 55 min    Short Term Goals: Week 2:  OT Short Term Goal 1 (Week 2): Pt will maintain static sit at EOB with min A to engage in self care. OT Short Term Goal 2 (Week 2): Pt will complete sit to stand at the sink with LB bathing tasks and mod assist for two consecutive sessions. OT Short Term Goal 3 (Week 2): Pt will complete stand pivot transfers to the [facility] with mod assist. OT Short Term Goal 4 (Week 2): Pt will donn brief and pants during LB dressing with overall mod assist sit to stand.  Skilled Therapeutic Interventions/Progress Updates:    Pt completed supine to sit EOB with mod assist to begin session.  He has stated he was dressed and cleaned up already, so did not work on ADL tasks.  He was able to complete squat pivot transfer to the wheelchair to the left with min assist using Bobath method secondary to increased pushing to the left.  Therapist then took him down to the therapy gym and transferred to the therapy mat with mod assist to the right Bobath method.  Had him work on sitting balance on the mat with dynamic reaching to the right side to help decrease pushing to the right.  Max facilitation needed to maintain anterior pelvic tilt and upright posture on the mat with use of the mirror for feedback.  He continues to demonstrate increased lean to the left in sitting.  Transitioned to working on sit to squat with reaching to the right with mod assist secondary to increased pushing to the left.  Completed several reps with pt reaching to place clothespins on the horizontal bar with the RUE with mod assist for functional reaching as well.  Incorporated some use of the LUE to pick up and place them to the right with rotation in sitting and mod facilitation from therapist in the trunk  to maintain upright posture and anterior pelvic tilt.  Finished session with transfer back to the wheelchair to the left squat pivot with min assist.  Returned to the room where pt remained up in the wheelchair with the call button and phone in reach and safety belt in place.    Therapy Documentation Precautions:  Precautions Precautions: Fall Precaution Comments: R>L hemi, R inattention. Poor postural control. Restrictions Weight Bearing Restrictions: No   Pain: Pain Assessment Pain Scale: Faces Pain Score: 0-No pain ADL: See Care Tool Section for some details of mobility and selfcare  Therapy/Group: Individual Therapy  [name] OTR/L 01/22/2020, 12:13 PM

## 2020-01-22 NOTE — Progress Notes (Signed)
Speech Language Pathology Daily Session Note  Patient Details  Name: Alexander Byrd MRN: [id number] Date of Birth: 06/30/1950  Today's Date: 01/22/2020 SLP Individual Time: 1350-1445 SLP Individual Time Calculation (min): 55 min  Short Term Goals: Week 2: SLP Short Term Goal 1 (Week 2): Pt will demonstrate efficient masticaiton and oral clearance of Dys 3 textures with no more than Min A cues for swallow strategies prior to upgrade. SLP Short Term Goal 2 (Week 2): Pt will consume trials of ice chips and thin H2O with minimal overt s/sx aspiration prior to repeating his MBSS to assess advancement potential (from nectar thick liquids). SLP Short Term Goal 3 (Week 2): Pt will sustain his attention to functional tasks for 10-15 minute intervals with Min A cues for redirection. SLP Short Term Goal 4 (Week 2): Pt will recall new/daily information with Min A for use of strategies/aids. SLP Short Term Goal 5 (Week 2): Pt will demonstrate awareness of functional errors with Min A verbal/visual cues. SLP Short Term Goal 6 (Week 2): Pt will demonstrate to functionally problem solving basic to semi-complex situatoins with Min A cues.  Skilled Therapeutic Interventions: Pt was seen for skilled ST targeting dysphagia and cognitive goals. SLP facilitated session with trials of thin H2O after oral care completed by pt via suction toothbrush with set up assist. Pt with no overt s/sx aspiration in 5/5 ice chips, 1 throat clear out of 9 self-fed teaspoons of thin H2O, and cough or throat clear in ~40% of self-fed cup sips of thin H2O. Recommend continue current diet with advanced trials with SLP at bedside to assess potential for advancement and/or repeat MBSS. Pt with limited recall of purpose of memory notebook started in last session, and mod A multimodal cues required for him to read information from last ST session recorded in notebook at end of session. However, pt did independently recall having requested  medication for indigestion and not having received it yet - RN did arrive approximately 10 minutes later with TUMS for pt.  Mod A verbal cues for recall also provided when relaying events of current ST session to record in notebook. Pt required Mod A verbal and visual cues to identify logical errors within a basic calendar based emergent awareness task. He also required Mod A for error awareness and problem solving during additional basic calendar interpretation tasks. Pt left laying in bed with alarm set and needs within reach. Continue per current plan of care.     Pain Pain Assessment Pain Scale: Faces Pain Score: 0-No pain  Therapy/Group: Individual Therapy  Alexander Byrd 01/22/2020, 7:21 AM

## 2020-01-22 NOTE — Progress Notes (Signed)
Pt slept fairly well throughout the night. Awakening when incontinent and requesting bed pan. No issues noted.

## 2020-01-22 NOTE — Progress Notes (Signed)
[place] PHYSICAL MEDICINE & REHABILITATION PROGRESS NOTE   Subjective/Complaints: Pain bottom of Right foot "going on for a good time"  No recent trauma, no numbness Pain closer to toes rather than the heel   ROS: denies pain, denies nausea vomiting diarrhea constipation  Objective:   No results found. No results for input(s): WBC, HGB, HCT, PLT in the last 72 hours. No results for input(s): NA, K, CL, CO2, GLUCOSE, BUN, CREATININE, CALCIUM in the last 72 hours.  Intake/Output Summary (Last 24 hours) at 01/22/2020 0854 Last data filed at 01/22/2020 0852 Gross per 24 hour  Intake 660 ml  Output 200 ml  Net 460 ml        Physical Exam: Vital Signs Blood pressure 129/87, pulse 86, temperature 98.3 F (36.8 C), resp. rate 18, height 5\' 7"  (1.702 m), weight 69.2 kg, SpO2 97 %.  General: No acute distress Mood and affect are appropriate Heart: Regular rate and rhythm no rubs murmurs or extra sounds Lungs: Clear to auscultation, breathing unlabored, no rales or wheezes Abdomen: Positive bowel sounds, soft nontender to palpation, nondistended Extremities: No clubbing, cyanosis, or edema Skin: No evidence of breakdown, no evidence of rash  Motor 4/5 in BUE and BLE   Cerebellar exam mod dysmetria finger to nose to finger as well as heel to shin inLeft  upper and lower extremities Musculoskeletal: Full range of motion in all 4 extremities. 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        Speech: Speech is delayed and slurred.        Behavior: Behavior is slowed.        Cognition and Memory: Cognition is impaired.    Assessment/Plan: 1. Functional deficits which require 3+ hours per day of interdisciplinary therapy in a comprehensive inpatient rehab setting.  Physiatrist is providing close team supervision and 24 hour management of active  medical problems listed below.  Physiatrist and rehab team continue to assess barriers to discharge/monitor patient progress toward functional and medical goals  Care Tool:  Bathing    Body parts bathed by patient: Chest,Abdomen,Face,Right arm,Left arm,Right upper leg,Left upper leg,Right lower leg   Body parts bathed by helper: Left lower leg,Buttocks,Front perineal area Body parts n/a: Buttocks,Front perineal area (pt previously cleansed)   Bathing assist Assist Level: Maximal Assistance - Patient 24 - 49%     Upper Body Dressing/Undressing Upper body dressing   What is the patient wearing?: Pull over shirt    Upper body assist Assist Level: Minimal Assistance - Patient > 75%    Lower Body Dressing/Undressing Lower body dressing      What is the patient wearing?: Incontinence brief,Pants     Lower body assist Assist for lower body dressing: Maximal Assistance - Patient 25 - 49%     Toileting Toileting Toileting Activity did not occur (Clothing management and hygiene only): N/A (no void or bm)  Toileting assist Assist for toileting: Total Assistance - Patient < 25%     Transfers Chair/bed transfer  Transfers assist  Chair/bed transfer activity did not occur: Safety/medical concerns  Chair/bed transfer assist level: Moderate Assistance - Patient 50 - 74% (squat pivot)     Locomotion Ambulation   Ambulation assist      Assist level: 2 helpers   Max distance: 8'   Walk 10 feet  activity   Assist  Walk 10 feet activity did not occur: Safety/medical concerns        Walk 50 feet activity   Assist Walk 50 feet with 2 turns activity did not occur: Safety/medical concerns         Walk 150 feet activity   Assist Walk 150 feet activity did not occur: Safety/medical concerns         Walk 10 feet on uneven surface  activity   Assist Walk 10 feet on uneven surfaces activity did not occur: Safety/medical concerns          Wheelchair     Assist Will patient use wheelchair at discharge?: Yes Type of Wheelchair: Manual    Wheelchair assist level: Supervision/Verbal cueing Max wheelchair distance: 144ft    Wheelchair 50 feet with 2 turns activity    Assist        Assist Level: Supervision/Verbal cueing   Wheelchair 150 feet activity     Assist      Assist Level: Supervision/Verbal cueing   Blood pressure 129/87, pulse 86, temperature 98.3 F (36.8 C), resp. rate 18, height 5\' 7"  (1.702 m), weight 69.2 kg, SpO2 97 %.  Medical Problem List and Plan: 1.  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             -patient may shower             -ELOS/Goals: 02/02/2020             Continue CIR PT, OT, speech 2.  Antithrombotics: -DVT/anticoagulation: Lovenox             -antiplatelet therapy: Aspirin 81 mg daily and Plavix 75 mg daily x3 months then aspirin daily 3. Pain Management: Continue Tylenol as needed RIght foot pain will check xray, appears chronic  4. Mood: Provide emotional support             -antipsychotic agents: N/A 5. 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 Skin/Wound Care: Routine skin checks. May d/c OV 7. Fluids/Electrolytes/Nutrition: Routine and outs.             12/20 electrolytes stable 8.  Hypertension.  Well controlled, continue Norvasc 5 mg daily.                   Vitals:   01/21/20 2017 01/22/20 0416  BP:  129/87  Pulse: 99 86  Resp:  18  Temp: 98.4 F (36.9 C) 98.3 F (36.8 C)  SpO2: 96% 97%  Well controlled 12/28- continue to monitor.   9.  Hyperlipidemia.  Lipitor 10.  BPH.  Continue Flomax 0.4 mg daily.               PVRs ordered 11.  Severe sepsis with acute enteritis.  Completed course of azithromycin.  Monitor hydration. 12.  AKI: resolved.  13. Dysphagia: D2  nectar thick, MBS 12/20.  14. Nausea: continue zofran prn  15.  Constipation increase Senna S, last BMs 12/24, may need Dulcolax suppository if no BM 16. Tachycardia: intermittent, continue to monitor.  LOS: 13 days A FACE TO FACE EVALUATION WAS PERFORMED  1/25 01/22/2020, 8:54 AM

## 2020-01-22 NOTE — Progress Notes (Signed)
Physical Therapy Session Note  Patient Details  Name: Alexander Byrd MRN: [id number] Date of Birth: 1950-11-28  Today's Date: 01/22/2020 PT Individual Time: 0805-0900  And 1007-1034 PT Individual Time Calculation (min): 55 min and 17 min  Short Term Goals: Week 2:  PT Short Term Goal 1 (Week 2): Pt will trasnfer to [facility] with min assist PT Short Term Goal 2 (Week 2): Pt will maintain standing balance with BUE support up to 1 min and mod assist PT Short Term Goal 3 (Week 2): Pt will ambulate 54ft with RW and mod assist of 1 PT Short Term Goal 4 (Week 2): pt will toerate sitting up in WC up to 2 hours between therapy session  Skilled Therapeutic Interventions/Progress Updates: Pt presented in [place] with nsg assisting pt as having incontinent BM episode. PTA transferred pt to toilet via [place] to empty bowels. PTA then cleaned pt total A at toilet and performed peri-care with pt in [place]. Pt able to hold midline for >30sec before demonstrating heavy L lateral lean in [place] but was able to regain with seated rest. Once cleaned and brief/pants donned transferred to toilet. Pt was able to don scrub top modA and washed face at sink with set up. Pt then propelled to rehab gym with supervision and BUE with x 1 brief rest break. In parallel pt performed STS requiring modA and max multimodal cues to minmize leaning to L OR leaning L arm on bar. Pt also participated in standing at parallel bars and crossing midline to pick up clothespins off basketball net on R 2 x 4. On second round pt able to return and maintain midline 1/4 trials vs 0/4 on first round. Attempted gait in parallel bars however after approx 2 steps pt with heavy L anteriolateral lean requiring max facilitation to correct. Pt transported back to room at end of session and agreeable to remain in w/c as this PTA will see pt again in 1 hour. Pt left in w/c with belt alarm on, call bell within reach and needs met.   Tx2: Pt presented in hallway hand  off from transport as pt just had x-ray of foot. Pt transported to rehab gym and performed squat pivot to R with modA. Participated in rotation to R and sitting balance tapping cones on R with LUE. Pt initially requiring assist to maintain midline however with repitition and mirror feedback pt able to improve and maintain midline with CGA. Performed STS x 5 with RW and mirror feedback. Pt required max facilitation to extend L trunk however difficulty wt shifting to R so hips heavy to L. Pt able to improve posture but unable to sustain on own. Performed squat pivot transfer modA to L back to w/c and transported back to room. Performed squat pivot to R with minA and required modA for sit to supine for BLE management. Pt left in bed at end of session with bed alarm on, call bell within reach and needs met.      Therapy Documentation Precautions:  Precautions Precautions: Fall Precaution Comments: R>L hemi, R inattention. Poor postural control. Restrictions Weight Bearing Restrictions: No General:   Vital Signs:   Pain: Pain Assessment Pain Scale: Faces Pain Score: 0-No pain Mobility:   Locomotion :    Trunk/Postural Assessment :    Balance:   Exercises:   Other Treatments:      Therapy/Group: Individual Therapy  Roby Donaway 01/22/2020, 12:27 PM

## 2020-01-23 ENCOUNTER — Inpatient Hospital Stay (HOSPITAL_COMMUNITY): Payer: Medicare Other | Admitting: Physical Therapy

## 2020-01-23 ENCOUNTER — Inpatient Hospital Stay (HOSPITAL_COMMUNITY): Payer: Medicare Other | Admitting: Occupational Therapy

## 2020-01-23 NOTE — Progress Notes (Signed)
Speech Language Pathology Weekly Progress and Session Note  Patient Details  Name: Alexander Byrd MRN: [id number] Date of Birth: [birth year]  Beginning of progress report period: January 17, 2020 End of progress report period: January 24, 2020  Today's Date: 01/24/2020 SLP Individual Time: 2505-3976 SLP Individual Time Calculation (min): 55 min  Short Term Goals: Week 2: SLP Short Term Goal 1 (Week 2): Pt will demonstrate efficient masticaiton and oral clearance of Dys 3 textures with no more than Min A cues for swallow strategies prior to upgrade. SLP Short Term Goal 1 - Progress (Week 2): Met SLP Short Term Goal 2 (Week 2): Pt will consume trials of ice chips and thin H2O with minimal overt s/sx aspiration prior to repeating his MBSS to assess advancement potential (from nectar thick liquids). SLP Short Term Goal 2 - Progress (Week 2): Progressing toward goal SLP Short Term Goal 3 (Week 2): Pt will sustain his attention to functional tasks for 10-15 minute intervals with Min A cues for redirection. SLP Short Term Goal 3 - Progress (Week 2): Met SLP Short Term Goal 4 (Week 2): Pt will recall new/daily information with Min A for use of strategies/aids. SLP Short Term Goal 4 - Progress (Week 2): Progressing toward goal SLP Short Term Goal 5 (Week 2): Pt will demonstrate awareness of functional errors with Min A verbal/visual cues. SLP Short Term Goal 5 - Progress (Week 2): Progressing toward goal SLP Short Term Goal 6 (Week 2): Pt will demonstrate to functionally problem solving basic to semi-complex situatoins with Min A cues. SLP Short Term Goal 6 - Progress (Week 2): Progressing toward goal    New Short Term Goals: Week 3: SLP Short Term Goal 1 (Week 3): STG=LTG due to remaining length of stay  Weekly Progress Updates: Pt has made functional gains and met 2 out of 6 short term goals. Pt is currently Min-Mod assist for basic to mildly complex tasks due to cognitive impairments  impacting his short term memory, attention, problem solving, and emergent awareness. He also has mildly delayed processing, although it and his verbal initiation and speech intelligible is much improved since admission. Pt is consuming an upgraded dysphagia 3 diet with nectar liquids via spoon and overall Min A for use of compensatory swallow strategies. Pt education is ongoing; no family has been present for ST sessions yet, and they would benefit from skilled ST education regarding swallow and cognitive-linguistic function prior to d/c home. Pt would continue to benefit from skilled ST while inpatient in order to maximize functional independence and reduce burden of care prior to discharge. Anticipate that pt will need 24/7 supervision at discharge in addition to [place] follow up at next level of care.      Intensity: Minumum of 1-2 x/day, 30 to 90 minutes Frequency: 3 to 5 out of 7 days Duration/Length of Stay: 02/02/20 Treatment/Interventions: Cognitive remediation/compensation;Cueing hierarchy;Patient/family education;Therapeutic Activities;Functional tasks;Dysphagia/aspiration precaution training;Internal/external aids;Speech/Language facilitation   Daily Session  Skilled Therapeutic Interventions: Pt was seen for skilled ST targeting dysphagia and cognitive goals. SLP facilitated session with an upgraded trial breakfast tray of Dysphagia 3 (mechanical soft) textures. Pt exhibited mildly prolonged mastication of solids, but no appreciable difference between this and Dys 2 (minced/ground) textures when compared. He typically requires extra time to complete all tasks, therefore this is not unusual for pt. He did demonstrate intermittent subtle throat clearing and 1 strong cough when consuming nectar thick orange juice via spoon (self fed), although no overt s/sx aspiration noted with solids.  Recommend pt upgrade to dysphagia 3 textures, continue nectar thick liquids via spoon with full supervision and  avoid dual consistencies. SLP further facilitated session with tasks targeting short term recall and compensatory memory strategies. Pt used verbal repetition strategy when describing picture scenes to encode information with Min A question cues/prompts. He then recalled information from 3 scenes with accuracy ranging from 70%-90% with Mod quickly faded to Min A semantic cues for recall. Memory notebook updated prior to end of session. He also only required Supervision A verbal cues for redirectoin in ~15 minute intervals today. Pt left laying in bed with alarm set and needs within reach. Continue per current plan of care.     Pain Pain Assessment Pain Scale: 0-10 Pain Score: 0-No pain  Therapy/Group: Individual Therapy  Arbutus Leas 01/24/2020, 7:29 AM

## 2020-01-23 NOTE — Progress Notes (Signed)
Occupational Therapy Session Note  Patient Details  Name: Alexander Byrd MRN: [id number] Date of Birth: 01/07/1951  Today's Date: 01/23/2020 OT Individual Time: 1421-1529 OT Individual Time Calculation (min): 68 min    Short Term Goals: Week 2:  OT Short Term Goal 1 (Week 2): Pt will maintain static sit at EOB with min A to engage in self care. OT Short Term Goal 2 (Week 2): Pt will complete sit to stand at the sink with LB bathing tasks and mod assist for two consecutive sessions. OT Short Term Goal 3 (Week 2): Pt will complete stand pivot transfers to the [facility] with mod assist. OT Short Term Goal 4 (Week 2): Pt will donn brief and pants during LB dressing with overall mod assist sit to stand.  Skilled Therapeutic Interventions/Progress Updates:    Pt in bed to start session.  Had him complete supine to sit EOB with mod assist in preparation for donning pants.  Increased pushing to the leftt noted in sitting posterior LOB as well. Max assist was needed for donning pants over feet with total assist +2 (pt 40%) for sit to stand to pull them up over his hips.  Had him complete squat pivot transfer to the wheelchair with mod assist in order to be transported down to the therapy gym.  Pt with amplifier in place during session to help with hearing. He completed transfer to the therapy mat with min assist squat pivot.  Session focused on midline orientation in sitting and standing as well as lateral weightshifts to the right in order to decreased pushing to the left.  Had him stand with total assist +2 (pt 25%) while picking up and placing horse shoes on the basketball rim with the RUE, emphasizing upright trunk, hip extension, and weightshift to the right.  He was able to complete several repetitions in standing.  Transitioned to sitting on wedge to help promote anterior pelvic tilt as he demonstrates posterior pelvic tilt when sitting on flat surface, requiring max facilitation to maintain anterior  tilt.  Had him work on functional reach to the right as well in sitting on the wedge with min facilitation at times to complete lateral weightshift.  He was able to complete functional mobility with total assist +2 (pt 30%) using three muskateers technique for approximately 24' and 10' to complete session.  Decreased ability to efficiently advance the LLE with increased adduction noted.  He was able to correct this when cued however. Finished session with transfer back to the wheelchair and the bed at min assist level squat pivot.  He transferred to supine with mod assist for lifting both LEs after lowering trunk.  Call button and phone in reach with safety alarm in place.    Therapy Documentation Precautions:  Precautions Precautions: Fall Precaution Comments: R>L hemi, R inattention. Poor postural control. Restrictions Weight Bearing Restrictions: No  Pain: Pain Assessment Pain Scale: Faces Pain Score: 0-No pain ADL: See Care Tool Section for some details of mobility and selfcare  Therapy/Group: Individual Therapy  Theresea Trautmann OTR/L 01/23/2020, 3:40 PM

## 2020-01-23 NOTE — Progress Notes (Signed)
Occupational Therapy Session Note  Patient Details  Name: Alexander Byrd MRN: [id number] Date of Birth: 17-Dec-1950  Today's Date: 01/23/2020 OT Individual Time: 1100-1155 OT Individual Time Calculation (min): 55 min    Short Term Goals: Week 2:  OT Short Term Goal 1 (Week 2): Pt will maintain static sit at EOB with min A to engage in self care. OT Short Term Goal 2 (Week 2): Pt will complete sit to stand at the sink with LB bathing tasks and mod assist for two consecutive sessions. OT Short Term Goal 3 (Week 2): Pt will complete stand pivot transfers to the [facility] with mod assist. OT Short Term Goal 4 (Week 2): Pt will donn brief and pants during LB dressing with overall mod assist sit to stand.   Skilled Therapeutic Interventions/Progress Updates:    Pt greeted at time of session supine in bed resting, agreeable to OT session with encouragement. Supine to sit EOB Min A for trunk elevation, squat pivot to wheelchair with Mod A toward R side, assist for positioning hips in chair. Pt agreeable to change shirt but did not want to change pants and declined further ADL. Note per NT pt had just been on toilet prior to OT session. Transported to gym via wheelchair dependent for time management and set up at high low table, performed 4 rounds of sit to stands, varying from Mod initially to Max with fatigue, cues for hips forward and hand placement. Pt able to static stand with BUE support on table and lateral weight shift minimallyfor NMR and proprioceptive input, L knee block throughout and assist to straighten LUE on table for weight bearing. Assist for standing initially Min A but needing Mod A towards end of standing d/t fatigue and flexed posture. Pt performed rebounder activity with 2kg green ball for proprioceptive input and BUE coordination for 1x10 and 1x20 tosses, L<R coordination noted for bimanual integration task. Transported back to room, squat pivot back to bed Min A toward R side, alarm on  call bell  Inreach. Attempted to get pt to sit up in wheelchair for lunch but continued to decline insisting on laying down. Note delayed processing throughout session needing frequent verbal and visual cues.   Therapy Documentation Precautions:  Precautions Precautions: Fall Precaution Comments: R>L hemi, R inattention. Poor postural control. Restrictions Weight Bearing Restrictions: No     Therapy/Group: Individual Therapy  Erasmo Score 01/23/2020, 11:26 AM

## 2020-01-23 NOTE — Progress Notes (Signed)
[facility] PHYSICAL MEDICINE & REHABILITATION PROGRESS NOTE   Subjective/Complaints:  Patient coming back from physical therapy, participated well.  Wants to go to bed before the next therapy.  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  Unable to do this at the current time or foreseeable future ROS: denies pain, denies nausea vomiting diarrhea constipation  Objective:   DG Foot Complete Right  Result Date: 01/22/2020 CLINICAL DATA:  Chronic right foot pain. EXAM: RIGHT FOOT COMPLETE - 3+ VIEW COMPARISON:  No prior. FINDINGS: No acute bony or joint abnormality identified. No evidence of fracture or dislocation. IMPRESSION: No acute abnormality. Electronically Signed   By: Marcello Moores  Register   On: 01/22/2020 10:23   No results for input(s): WBC, HGB, HCT, PLT in the last 72 hours. No results for input(s): NA, K, CL, CO2, GLUCOSE, BUN, CREATININE, CALCIUM in the last 72 hours.  Intake/Output Summary (Last 24 hours) at 01/23/2020 0851 Last data filed at 01/23/2020 0253 Gross per 24 hour  Intake 1973.46 ml  Output 820 ml  Net 1153.46 ml        Physical Exam: Vital Signs Blood pressure 118/69, pulse 83, temperature 99.7 F (37.6 C), temperature source Oral, resp. rate 18, height $RemoveBe'5\' 7"'XbseYaGwk$  (1.702 m), weight 69.2 kg, SpO2 96 %.   General: No acute distress Mood and affect are appropriate Heart: Regular rate and rhythm no rubs murmurs or extra sounds Lungs: Clear to auscultation, breathing unlabored, no rales or wheezes Abdomen: Positive bowel sounds, soft nontender to palpation, nondistended Extremities: No clubbing, cyanosis, or edema Skin: No evidence of breakdown, no evidence of rash Sitting balance is poor leans toward the left Motor 4/5 in BUE and BLE   Cerebellar exam mod dysmetria finger to nose to finger as well as heel to shin inLeft  upper and lower extremities Musculoskeletal: Full range of motion in all 4 extremities. 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        Speech: Speech is delayed and slurred.        Behavior: Behavior is slowed.        Cognition and Memory: Cognition is impaired.    Assessment/Plan: 1. Functional deficits which require 3+ hours per day of interdisciplinary therapy in a comprehensive inpatient rehab setting.  Physiatrist is providing close team supervision and 24 hour management of active medical problems listed below.  Physiatrist and rehab team continue to assess barriers to discharge/monitor patient progress toward functional and medical goals  Care Tool:  Bathing    Body parts bathed by patient: Chest,Abdomen,Face,Right arm,Left arm,Right upper leg,Left upper leg,Right lower leg   Body parts bathed by helper: Left lower leg,Buttocks,Front perineal area Body parts n/a: Buttocks,Front perineal area (pt previously cleansed)   Bathing assist Assist Level: Maximal Assistance - Patient 24 - 49%     Upper Body Dressing/Undressing Upper body dressing   What is the patient wearing?: Pull over shirt    Upper body assist Assist Level: Minimal Assistance - Patient > 75%    Lower Body Dressing/Undressing Lower body dressing      What is the patient wearing?: Incontinence brief,Pants     Lower body assist Assist for lower body dressing: Maximal Assistance - Patient 25 - 49%     Toileting Toileting Toileting Activity did not occur Landscape architect  and hygiene only): N/A (no void or bm)  Toileting assist Assist for toileting: Total Assistance - Patient < 25%     Transfers Chair/bed transfer  Transfers assist  Chair/bed transfer activity did not occur: Safety/medical concerns  Chair/bed transfer assist level: Minimal Assistance - Patient > 75% (squat pivot)     Locomotion Ambulation   Ambulation assist      Assist level: 2 helpers   Max distance: 8'    Walk 10 feet activity   Assist  Walk 10 feet activity did not occur: Safety/medical concerns        Walk 50 feet activity   Assist Walk 50 feet with 2 turns activity did not occur: Safety/medical concerns         Walk 150 feet activity   Assist Walk 150 feet activity did not occur: Safety/medical concerns         Walk 10 feet on uneven surface  activity   Assist Walk 10 feet on uneven surfaces activity did not occur: Safety/medical concerns         Wheelchair     Assist Will patient use wheelchair at discharge?: Yes Type of Wheelchair: Manual    Wheelchair assist level: Supervision/Verbal cueing Max wheelchair distance: 143ft    Wheelchair 50 feet with 2 turns activity    Assist        Assist Level: Supervision/Verbal cueing   Wheelchair 150 feet activity     Assist      Assist Level: Supervision/Verbal cueing   Blood pressure 118/69, pulse 83, temperature 99.7 F (37.6 C), temperature source Oral, resp. rate 18, height $RemoveBe'5\' 7"'wUTUVcCsG$  (1.702 m), weight 69.2 kg, SpO2 96 %.  Medical Problem List and Plan: 1.  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             -patient may shower             -ELOS/Goals: 02/02/2020             Continue CIR PT, OT, speech Team conference today please see physician documentation under team conference tab, met with team  to discuss problems,progress, and goals. Formulized individual treatment plan based on medical history, underlying problem and comorbidities.  2.  Antithrombotics: -DVT/anticoagulation: Lovenox             -antiplatelet therapy: Aspirin 81 mg daily and Plavix 75 mg daily x3 months then aspirin daily 3. Pain Management: Continue Tylenol as needed RIght foot pain will check xray, appears chronic  4. Mood: Provide emotional support             -antipsychotic agents: N/A 5. 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 Skin/Wound Care: Routine skin checks. May d/c OV 7. Fluids/Electrolytes/Nutrition: Routine and outs.             12/20 electrolytes stable 8.  Hypertension.  Well controlled, continue Norvasc 5 mg daily.                   Vitals:   01/22/20 2002 01/23/20 0451  BP: (!) 150/86 118/69  Pulse: 93 83  Resp: 18 18  Temp: 100 F (37.8 C) 99.7 F (37.6 C)  SpO2: 96% 96%  Well controlled 12/29- continue to monitor.   9.  Hyperlipidemia.  Lipitor 10.  BPH.  Continue Flomax 0.4 mg daily.  PVRs ordered 11.  Severe sepsis with acute enteritis.  Completed course of azithromycin.  Monitor hydration. 12.  AKI: resolved.  13. Dysphagia: D2 nectar thick, MBS 12/20.  14. Nausea: continue zofran prn  15.  Constipation increase Senna S, last BMs 12/24, may need Dulcolax suppository if no BM 16. Tachycardia: intermittent, continue to monitor.  LOS: 14 days A FACE TO FACE EVALUATION WAS PERFORMED  Charlett Blake 01/23/2020, 8:51 AM

## 2020-01-23 NOTE — Progress Notes (Signed)
Patient ID: Alexander Byrd, male   DOB: 01/09/1951, 69 y.o.   MRN: [id number]   SW attempting to schedule family edu 1/3, 1/4, 1/5. Family will contact sw if able to attend.  [place], [place] [phone]

## 2020-01-23 NOTE — Patient Care Conference (Signed)
Inpatient RehabilitationTeam Conference and Plan of Care Update Date: 01/23/2020   Time: 10:17 AM    Patient Name: Alexander Byrd      Medical Record Number: [id number]  Date of Birth: [birth year] Sex: Male         Room/Bed: 4W13C/4W13C-01 Payor Info: Payor: MEDICARE / Plan: MEDICARE PART A AND B / Product Type: *No Product type* /    Admit Date/Time:  01/09/2020 12:30 PM  Primary Diagnosis:  Wallenberg syndrome  Hospital Problems: Principal Problem:   Wallenberg syndrome    Expected Discharge Date: Expected Discharge Date: 02/02/20  Team Members Present: Physician leading conference: Dr. Claudette Laws Care Coodinator Present: Chana Bode, RN, BSN, CRRN;Christina Vita Barley, BSW Nurse Present: Adam Phenix, LPN PT Present: Grier Rocher, PT OT Present: Blanch Media, OT SLP Present: Suzzette Righter, CF-SLP PPS Coordinator present : Edson Snowball, [place] Breed, SLP     Current Status/Progress Goal Weekly Team Focus  Bowel/Bladder   pt continent to B/B. LBM 01/22/20  Pt will remain continent of B/B  Assess B/B every shift and PRN q2 toilet training   Swallow/Nutrition/ Hydration   Dys 2, nectar thick liquids, full supervision  Supervision  trials upgraded textures, carryover swallow strategies, repeat MBS   ADL's   Supervision for UB bathing and dressing, max assist for LB bathing and dressing sit to stand.  Increased pushing to the left in sitting and in standing.  Min to mod assist for squat pivot transfers depending on direction.  total assist +2 for clothing management sit to stand during toileting tasks.  overall min assist level with mod for tub transfers  selfcare retraining, transfer training, balance retraining, neuromuscular re-education, DME education,   Mobility   minA bed mobility, modA squat pivot transfers, minA STS, modA gait with Carley Hammed walker for trunk elongation/stability as pt continues to demosntrate L lateral trunk weakness  min assist  standing  tolreance, midline orientation, transfers, sitting balance   Communication   delayed processing but improving, dysphonia and mild dysarthria  Supervision  voice/speech intelligibility strategies   Safety/Cognition/ Behavioral Observations  Min A  Supervision  error awareness, basic familiar problem solving, sustained attention, memory   Pain   FACES scale 0/10 today  FACES <4/10  assess pain per shift and PRN   Skin   skin intact  No new skin breakdown during stay  assess skin per shift and prn     Discharge Planning:  Discharging home with spouse, and sisters (lives next door). 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 Currently on D2- Nectar this diet. Continent of bowel and bladder with good progress noted overall. Progress limited by left side ataxia and left lateral lean with pushing to the left and fatigue.  Patient on target to meet rehab goals: yes, Currently supervision for upper body and max assist for lower body ADLs with min-mod assist for squat pivot transfers and min assist goals set except for mod assist for tub transfers  *See Care Plan and progress notes for long and short-term goals.   Revisions to Treatment Plan:  MBS repeat planned for next week  Teaching Needs: Toileting, transfers, medications, etc.   Current Barriers to Discharge: Decreased caregiver support  Possible Resolutions to Barriers:  Hired caregivers to assist sister addressed by family Family education     Medical Summary Current Status: Left lateral portion left hemiataxia dysphagia, blood pressure labile  Barriers  to Discharge: Other (comments);Medical stability  Barriers to Discharge Comments: Truncal balance Possible Resolutions to Barriers/Weekly Focus: Continue PT OT, need to explore with family whether 24-hour supervision is available   Continued Need for Acute  Rehabilitation Level of Care: The patient requires daily medical management by a physician with specialized training in physical medicine and rehabilitation for the following reasons: Direction of a multidisciplinary physical rehabilitation program to maximize functional independence : Yes Medical management of patient stability for increased activity during participation in an intensive rehabilitation regime.: Yes Analysis of laboratory values and/or radiology reports with any subsequent need for medication adjustment and/or medical intervention. : Yes   I attest that I was present, lead the team conference, and concur with the assessment and plan of the team.   Chana Bode B 01/23/2020, 2:20 PM

## 2020-01-23 NOTE — Progress Notes (Signed)
Patient ID: Alexander Byrd, male   DOB: 1951/01/21, 69 y.o.   MRN: [id number] Team Conference Report to Patient/Family  Team Conference discussion was reviewed with the patient and caregiver, including goals, any changes in plan of care and target discharge date.  Patient and caregiver express understanding and are in agreement.  The patient has a target discharge date of 02/02/20.  Andria Rhein 01/23/2020, 2:05 PM

## 2020-01-23 NOTE — Progress Notes (Signed)
Physical Therapy Session Note  Patient Details  Name: Alexander Byrd MRN: [id number] Date of Birth: 04-30-1950  Today's Date: 01/23/2020 PT Individual Time: 0800-0910 PT Individual Time Calculation (min): 70 min   Short Term Goals: Week 2:  PT Short Term Goal 1 (Week 2): Pt will trasnfer to [facility] with min assist PT Short Term Goal 2 (Week 2): Pt will maintain standing balance with BUE support up to 1 min and mod assist PT Short Term Goal 3 (Week 2): Pt will ambulate 66ft with RW and mod assist of 1 PT Short Term Goal 4 (Week 2): pt will toerate sitting up in WC up to 2 hours between therapy session  Skilled Therapeutic Interventions/Progress Updates:   Pt received supine in bed and agreeable to PT. Supine>sit transfer with supervision assist and cues for midline once sitting EOB. PT assisted pt to don pants sitting EOB with max assist for time management.  Pt performed sit<>stand with min assist and BUE support on RW. Once in standing, pt required min assist to maintain balance x 10 sec while PT pulled pants to waist with max assist. Squat pivot transfer to the L with min assist to improve upright trunk posture.   Pt performed WC mobility through hall of rehab unit 2 x 128ft to and from room with distant supervision assist assist from PT  With cues for doorway management.   PT instructed pt in standing NMR to force improved midline orientation and WB through BLE andf LUE with use of eva walker. Lateral reaching  To the R to place 8 horses on basketball goal with min-mod assist to improve terminal knee extension on the RLE. R foot tap on 2 inch step with BUE support on Eva 2  x11 with visual feed back from mirror on second bout. Pt noted to increased L lateral bias with fatigue, but improved midline orientation with mirror for feedback.   Gait traingin with eva walker forward and reverse 2 x 74ft with mod assist fading to max assist on second bout due to fatigue. Physical barrier between BLE to  maintain step width and visual feedback from mirror tim improve midline. Facilitation at trunk to improve weight shift to the R and engage extensor muscles on the L in stance. Able to perform step through pattern forward and step to in reverse.   Pt returned to room and performed squat pivot transfer to bed with Min assist and RUE supported on bed rail. Sitting balance EOB supervision assist and RUE supported on bed for MD to perform assessment x 3 min. Sit>supine completed with min assist at the LLE. Pt performed scooting to [facility] with supervision assist, and left supine in bed with call bell in reach and all needs met.        Therapy Documentation Precautions:  Precautions Precautions: Fall Precaution Comments: R>L hemi, R inattention. Poor postural control. Restrictions Weight Bearing Restrictions: No Pain:   denies    Other Treatments:      Therapy/Group: Individual Therapy  [name] 01/23/2020, 9:21 AM

## 2020-01-24 ENCOUNTER — Inpatient Hospital Stay (HOSPITAL_COMMUNITY): Payer: Medicare Other | Admitting: Physical Therapy

## 2020-01-24 ENCOUNTER — Inpatient Hospital Stay (HOSPITAL_COMMUNITY): Payer: Medicare Other | Admitting: Speech Pathology

## 2020-01-24 ENCOUNTER — Inpatient Hospital Stay (HOSPITAL_COMMUNITY): Payer: Medicare Other | Admitting: Occupational Therapy

## 2020-01-24 MED ORDER — CALCIUM CARBONATE ANTACID 500 MG PO CHEW
1.0000 | CHEWABLE_TABLET | Freq: Once | ORAL | Status: AC
  Administered 2020-01-24: 22:00:00 200 mg via ORAL
  Filled 2020-01-24 (×2): qty 1

## 2020-01-24 NOTE — Progress Notes (Signed)
[place] PHYSICAL MEDICINE & REHABILITATION PROGRESS NOTE   Subjective/Complaints:  Discussed dysphagia with SLP, upgraded to D3, still with nectar liq via tsp   ROS: denies pain, denies nausea vomiting diarrhea constipation  Objective:   DG Foot Complete Right  Result Date: 01/22/2020 CLINICAL DATA:  Chronic right foot pain. EXAM: RIGHT FOOT COMPLETE - 3+ VIEW COMPARISON:  No prior. FINDINGS: No acute bony or joint abnormality identified. No evidence of fracture or dislocation. IMPRESSION: No acute abnormality. Electronically Signed   By: Maisie Fus  Register   On: 01/22/2020 10:23   No results for input(s): WBC, HGB, HCT, PLT in the last 72 hours. No results for input(s): NA, K, CL, CO2, GLUCOSE, BUN, CREATININE, CALCIUM in the last 72 hours.  Intake/Output Summary (Last 24 hours) at 01/24/2020 0837 Last data filed at 01/24/2020 0813 Gross per 24 hour  Intake 722.91 ml  Output 800 ml  Net -77.09 ml        Physical Exam: Vital Signs Blood pressure 127/66, pulse 80, temperature 97.8 F (36.6 C), resp. rate 20, height 5\' 7"  (1.702 m), weight 69.2 kg, SpO2 94 %.    General: No acute distress Mood and affect are appropriate Heart: Regular rate and rhythm no rubs murmurs or extra sounds Lungs: Clear to auscultation, breathing unlabored, no rales or wheezes Abdomen: Positive bowel sounds, soft nontender to palpation, nondistended Extremities: No clubbing, cyanosis, or edema Skin: No evidence of breakdown, no evidence of rash  Sitting balance is poor leans toward the left Motor 4/5 in BUE and BLE   Cerebellar exam mod dysmetria finger to nose to finger as well as heel to shin inLeft  upper and lower extremities Musculoskeletal: Full range of motion in all 4 extremities. 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        Speech: Speech is  delayed and slurred.        Behavior: Behavior is slowed.        Cognition and Memory: Cognition is impaired.    Assessment/Plan: 1. Functional deficits which require 3+ hours per day of interdisciplinary therapy in a comprehensive inpatient rehab setting.  Physiatrist is providing close team supervision and 24 hour management of active medical problems listed below.  Physiatrist and rehab team continue to assess barriers to discharge/monitor patient progress toward functional and medical goals  Care Tool:  Bathing    Body parts bathed by patient: Chest,Abdomen,Face,Right arm,Left arm,Right upper leg,Left upper leg,Right lower leg   Body parts bathed by helper: Left lower leg,Buttocks,Front perineal area Body parts n/a: Buttocks,Front perineal area (pt previously cleansed)   Bathing assist Assist Level: Maximal Assistance - Patient 24 - 49%     Upper Body Dressing/Undressing Upper body dressing   What is the patient wearing?: Pull over shirt    Upper body assist Assist Level: Minimal Assistance - Patient > 75%    Lower Body Dressing/Undressing Lower body dressing      What is the patient wearing?: Pants     Lower body assist Assist for lower body dressing: 2 Helpers (sit to stand)     Activity did not occur (Clothing management and hygiene only): N/A (no void or bm)  Toileting assist Assist for toileting: Total Assistance - Patient < 25%     Transfers Chair/bed transfer  Transfers assist  Chair/bed transfer activity did not occur: Safety/medical concerns  Chair/bed transfer assist level: Moderate Assistance - Patient 50 - 74% (squat pivot)     Locomotion Ambulation   Ambulation assist      Assist level: 2 helpers   Max distance: 24'   Walk 10 feet activity   Assist  Walk 10 feet activity did not occur: Safety/medical concerns        Walk 50 feet activity   Assist Walk 50 feet with 2 turns activity did not  occur: Safety/medical concerns         Walk 150 feet activity   Assist Walk 150 feet activity did not occur: Safety/medical concerns         Walk 10 feet on uneven surface  activity   Assist Walk 10 feet on uneven surfaces activity did not occur: Safety/medical concerns         Wheelchair     Assist Will patient use wheelchair at discharge?: Yes Type of Wheelchair: Manual    Wheelchair assist level: Supervision/Verbal cueing Max wheelchair distance: 158ft    Wheelchair 50 feet with 2 turns activity    Assist        Assist Level: Supervision/Verbal cueing   Wheelchair 150 feet activity     Assist      Assist Level: Supervision/Verbal cueing   Blood pressure 127/66, pulse 80, temperature 97.8 F (36.6 C), resp. rate 20, height 5\' 7"  (1.702 m), weight 69.2 kg, SpO2 94 %.  Medical Problem List and Plan: 1.  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             -patient may shower             -ELOS/Goals: 02/02/2020             Continue CIR PT, OT, speech   2.  Antithrombotics: -DVT/anticoagulation: Lovenox             -antiplatelet therapy: Aspirin 81 mg daily and Plavix 75 mg daily x3 months then aspirin daily 3. Pain Management: Continue Tylenol as needed RIght foot pain will check xray, appears chronic  4. Mood: Provide emotional support             -antipsychotic agents: N/A 5. 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 Skin/Wound Care: Routine skin checks. May d/c OV 7. Fluids/Electrolytes/Nutrition: Routine and outs.             12/20 electrolytes stable 8.  Hypertension.  Well controlled, continue Norvasc 5 mg daily.                   Vitals:   01/23/20 1944 01/24/20 0524  BP: 133/80 127/66  Pulse: 96 80  Resp: 17 20  Temp: 98.9 F (37.2 C) 97.8 F (36.6  C)  SpO2: 94% 94%  Well controlled 12/30- continue to monitor.   9.  Hyperlipidemia.  Lipitor 10.  BPH.  Continue Flomax 0.4 mg daily.               PVRs ordered 11.  Severe sepsis with acute enteritis.  Completed course of azithromycin.  Monitor hydration. 12.  AKI: resolved.  13. Dysphagia: D2 nectar thick, MBS 12/20.  14. Nausea: continue zofran prn  15.  Constipation increase Senna S, last BMs 12/24, may need Dulcolax suppository if no BM  16. Tachycardia: intermittent, continue to monitor.  LOS: 15 days A FACE TO FACE EVALUATION WAS PERFORMED  Erick Colace 01/24/2020, 8:37 AM

## 2020-01-24 NOTE — Progress Notes (Signed)
Occupational Therapy Session Note  Patient Details  Name: Alexander Byrd MRN: [id number] Date of Birth: December 22, 1950  Today's Date: 01/24/2020 OT Individual Time: 1421-1530 OT Individual Time Calculation (min): 69 min    Short Term Goals: Week 2:  OT Short Term Goal 1 (Week 2): Pt will maintain static sit at EOB with min A to engage in self care. OT Short Term Goal 2 (Week 2): Pt will complete sit to stand at the sink with LB bathing tasks and mod assist for two consecutive sessions. OT Short Term Goal 3 (Week 2): Pt will complete stand pivot transfers to the [facility] with mod assist. OT Short Term Goal 4 (Week 2): Pt will donn brief and pants during LB dressing with overall mod assist sit to stand.  Skilled Therapeutic Interventions/Progress Updates:    Pt in bed to start session, agreeable to completion of ADL tasks at shower level.  Had him transfer from supine to sit with min assist and then squat pivot at the same level to the wheelchair.  Therapist then took him into the shower where he completed stand pivot transfer to the tub seat with mod assist.  Increased pushing to the left as well as increased trunk and hip flexion noted.  He was able to remove his clothing with mod assist sit to stand before beginning shower.  He completed bathing with mod instructional cueing for thoroughness and sequencing and with mod assist for standing balance when washing buttocks.  Increased lean forward and to the left noted while working on bathing with pt needing max demonstrational cueing to correct.  Once bathing was completed he transferred back to the wheelchair with max assist stand pivot in order to transition to the sink for dressing tasks.  Supervision was needed for donning a pullover shirt with mod assist for donning brief and pants.  He was able to donn his left gripper sock but could not cross the RLE over the left knee to donn it and therapist assisted.  Finished session with work on functional  mobility and balance using three muskateers technique.  He was able to walk approximately 46' with total +2 (pt 35%).  Increased trunk and knee flexion noted at times with decreased ability to advance the LLE without it adducting and scissoring.  Therapist assisted with placement 30% of the time.  Returned to the room and used the RW for support to complete transfer back to the bed with mod assist.  Pt left with call button and phone in reach and safety alarm in place.    Therapy Documentation Precautions:  Precautions Precautions: Fall Precaution Comments: R>L hemi, R inattention. Poor postural control. Restrictions Weight Bearing Restrictions: No   Pain: Pain Assessment Pain Scale: Faces Pain Score: 0-No pain ADL: See Care Tool Section for some details of mobility and selfcare  Therapy/Group: Individual Therapy  Ayris Carano OTR/L 01/24/2020, 4:29 PM

## 2020-01-24 NOTE — Progress Notes (Signed)
Patient ID: Alexander Byrd, male   DOB: 1950/10/16, 69 y.o.   MRN: [id number]   Family education scheduled 1/3-1/5 (1-3 PM)  Lavera Guise, [place] [phone]

## 2020-01-24 NOTE — Progress Notes (Signed)
Physical Therapy Session Note  Patient Details  Name: Alexander Byrd MRN: [id number] Date of Birth: 07-19-1950  Today's Date: 01/24/2020 PT Individual Time: 1110-1200 PT Individual Time Calculation (min): 50 min   Short Term Goals: Week 2:  PT Short Term Goal 1 (Week 2): Pt will trasnfer to [facility] with min assist PT Short Term Goal 2 (Week 2): Pt will maintain standing balance with BUE support up to 1 min and mod assist PT Short Term Goal 3 (Week 2): Pt will ambulate 55ft with RW and mod assist of 1 PT Short Term Goal 4 (Week 2): pt will toerate sitting up in WC up to 2 hours between therapy session  Skilled Therapeutic Interventions/Progress Updates:   Pt received supine in bed and agreeable to PT. NT Present reporting pt's need to urinate. Supine>sit transfer with min, assist and moderate cues for problem solving to perform reciprocal scooting to EOB. Sit<>stand in stedy with supervision assist. Pt transported to toilet. Voided bladder and small bowel movement while sitting on toilet. Peri care comeplted by PT while pt standing in stedy. Pt then transported to [facility]. Lower body dressing with mod assist to pull pants over feet and mod assist to maintain balance while pulling pants to waist with RUE.  WC mobility with supervision assist 2 x 197ft with cues for improved positioning in WC as well as safety in turns to avoid doorways.   Sit<>stand followed by standing tolerance with LUE supported on RW and UE maintained in overhead reach to limit pushers syndrome; 3 x 10-30seconds with min assist fading to max assist with fatigue. Pt then perform standing balance with 1 LE on 2 inch step x 15 sec BLE; mod assist to prevent L LOB. Visual feedback from mirrot throughout with only mild improvement in midline with use of mirror.   Pt returned to room and performed stand pivot transfer to bed with min assist and UE supported on bed rail. Sit>supine completed with min assist fotr LLE control, and left  supine in bed with call bell in reach and all needs met.        Therapy Documentation Precautions:  Precautions Precautions: Fall Precaution Comments: R>L hemi, R inattention. Poor postural control. Restrictions Weight Bearing Restrictions: No General:   Vital Signs:   Pain: Pain Assessment Pain Scale: 0-10 Pain Score: 0-No pain Mobility:   Locomotion :    Trunk/Postural Assessment :    Balance:   Exercises:   Other Treatments:      Therapy/Group: Individual Therapy  [name] 01/24/2020, 12:10 PM

## 2020-01-25 ENCOUNTER — Inpatient Hospital Stay (HOSPITAL_COMMUNITY): Payer: Medicare Other | Admitting: Physical Therapy

## 2020-01-25 ENCOUNTER — Inpatient Hospital Stay (HOSPITAL_COMMUNITY): Payer: Medicare Other

## 2020-01-25 NOTE — Progress Notes (Signed)
[place] PHYSICAL MEDICINE & REHABILITATION PROGRESS NOTE   Subjective/Complaints:  No issues overnite  ROS: denies pain, denies nausea vomiting diarrhea constipation  Objective:   No results found. No results for input(s): WBC, HGB, HCT, PLT in the last 72 hours. No results for input(s): NA, K, CL, CO2, GLUCOSE, BUN, CREATININE, CALCIUM in the last 72 hours.  Intake/Output Summary (Last 24 hours) at 01/25/2020 0944 Last data filed at 01/25/2020 0231 Gross per 24 hour  Intake 837.84 ml  Output 625 ml  Net 212.84 ml        Physical Exam: Vital Signs Blood pressure 126/75, pulse 86, temperature 98.6 F (37 C), temperature source Oral, resp. rate 20, height 5\' 7"  (1.702 m), weight 69.2 kg, SpO2 96 %.  General: No acute distress Mood and affect are appropriate Heart: Regular rate and rhythm no rubs murmurs or extra sounds Lungs: Clear to auscultation, breathing unlabored, no rales or wheezes Abdomen: Positive bowel sounds, soft nontender to palpation, nondistended Extremities: No clubbing, cyanosis, or edema Skin: No evidence of breakdown, no evidence of rash   Sitting balance is poor leans toward the left Motor 4/5 in BUE and BLE   Cerebellar exam mod dysmetria finger to nose to finger as well as heel to shin inLeft  upper and lower extremities Musculoskeletal: Full range of motion in all 4 extremities. 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        Speech: Speech is delayed and slurred.        Behavior: Behavior is slowed.        Cognition and Memory: Cognition is impaired.    Assessment/Plan: 1. Functional deficits which require 3+ hours per day of interdisciplinary therapy in a comprehensive inpatient rehab setting.  Physiatrist is providing close team supervision and 24 hour management of active medical problems listed  below.  Physiatrist and rehab team continue to assess barriers to discharge/monitor patient progress toward functional and medical goals  Care Tool:  Bathing    Body parts bathed by patient: Chest,Abdomen,Face,Right arm,Left arm,Right upper leg,Left upper leg,Right lower leg,Front perineal area,Left lower leg   Body parts bathed by helper: Buttocks Body parts n/a: Buttocks,Front perineal area (pt previously cleansed)   Bathing assist Assist Level: Moderate Assistance - Patient 50 - 74%     Upper Body Dressing/Undressing Upper body dressing   What is the patient wearing?: Pull over shirt    Upper body assist Assist Level: Supervision/Verbal cueing    Lower Body Dressing/Undressing Lower body dressing      What is the patient wearing?: Pants,Incontinence brief     Lower body assist Assist for lower body dressing: Moderate Assistance - Patient 50 - 74%     Toileting Toileting Toileting Activity did not occur (Clothing management and hygiene only): N/A (no void or bm)  Toileting assist Assist for toileting: Total Assistance - Patient < 25%     Transfers Chair/bed transfer  Transfers assist  Chair/bed transfer activity did not occur: Safety/medical concerns  Chair/bed transfer assist level: Minimal Assistance - Patient > 75% (squat pivot)     Locomotion Ambulation   Ambulation assist      Assist level: 2 helpers   Max distance: 56'   Walk 10 feet activity   Assist  Walk 10 feet activity did not occur: Safety/medical concerns  Walk 50 feet activity   Assist Walk 50 feet with 2 turns activity did not occur: Safety/medical concerns         Walk 150 feet activity   Assist Walk 150 feet activity did not occur: Safety/medical concerns         Walk 10 feet on uneven surface  activity   Assist Walk 10 feet on uneven surfaces activity did not occur: Safety/medical concerns         Wheelchair     Assist Will patient use  wheelchair at discharge?: Yes Type of Wheelchair: Manual    Wheelchair assist level: Supervision/Verbal cueing Max wheelchair distance: 120ft    Wheelchair 50 feet with 2 turns activity    Assist        Assist Level: Supervision/Verbal cueing   Wheelchair 150 feet activity     Assist      Assist Level: Supervision/Verbal cueing   Blood pressure 126/75, pulse 86, temperature 98.6 F (37 C), temperature source Oral, resp. rate 20, height 5\' 7"  (1.702 m), weight 69.2 kg, SpO2 96 %.  Medical Problem List and Plan: 1.  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             -patient may shower             -ELOS/Goals: 02/02/2020             Continue CIR PT, OT, speech   2.  Antithrombotics: -DVT/anticoagulation: Lovenox             -antiplatelet therapy: Aspirin 81 mg daily and Plavix 75 mg daily x3 months then aspirin daily 3. Pain Management: Continue Tylenol as needed RIght foot pain will check xray, appears chronic  4. Mood: Provide emotional support             -antipsychotic agents: N/A 5. 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 Skin/Wound Care: Routine skin checks. May d/c OV 7. Fluids/Electrolytes/Nutrition: Routine and outs.             12/20 electrolytes stable 8.  Hypertension.  Well controlled, continue Norvasc 5 mg daily.                   Vitals:   01/24/20 1932 01/25/20 0450  BP: (!) 157/88 126/75  Pulse: 99 86  Resp: 20 20  Temp: 98.4 F (36.9 C) 98.6 F (37 C)  SpO2: 96% 96%  Well controlled 12/31  continue to monitor.   9.  Hyperlipidemia.  Lipitor 10.  BPH.  Continue Flomax 0.4 mg daily.               PVRs ordered 11.  Severe sepsis with acute enteritis.  Completed course of azithromycin.  Monitor hydration. 12.  AKI: resolved.  13. Dysphagia: D2 nectar thick,  MBS 12/20.  14. Nausea: continue zofran prn  15.  Constipation cont.  Senna S, last BMs 12/30,16. Tachycardia: intermittent, continue to monitor.  LOS: 16 days A FACE TO FACE EVALUATION WAS PERFORMED  01-01-1983 01/25/2020, 9:44 AM

## 2020-01-25 NOTE — Progress Notes (Signed)
Occupational Therapy Session Note  Patient Details  Name: Alexander Byrd MRN: [id number] Date of Birth: 1950-03-22  Today's Date: 01/25/2020 OT Individual Time: 1400-1500 OT Individual Time Calculation (min): 60 min    Short Term Goals: Week 1:  OT Short Term Goal 1 (Week 1): Pt will sit to EOB with mod A. OT Short Term Goal 1 - Progress (Week 1): Met OT Short Term Goal 2 (Week 1): Pt will maintain static sit at EOB with min A to engage in self care. OT Short Term Goal 2 - Progress (Week 1): Not met OT Short Term Goal 3 (Week 1): Pt will be able to bathe both arms with CGA. OT Short Term Goal 3 - Progress (Week 1): Met OT Short Term Goal 4 (Week 1): Pt will transfer to [facility] with max A. OT Short Term Goal 4 - Progress (Week 1): Met  Skilled Therapeutic Interventions/Progress Updates:    1:1. Pt received in bed agreeable to OT reporting BM in brief. Pt only with small smear, however brief cleaned and peri area washed with MOD A to wash buttocks. Pt dons pants STS at EOB with RW after MOD HOH A to thread BLE and A to advance past hips. Pt completes mobility with B PFRW throughout session with MOD A of 1 with increased A for turns and VC for stepping L foot farther to L 3x10'. Pt completes 2 standing trials with 1UE support and OT facilitating weight shift to R and straighten L knee in standing reaching anteriorly and R for horse shoes. Pt completes seated rest inbetween trials with MIN-CGA for sitting balacne and tactile cues while pt complete 2x20 beach ball volley with 2# dowel rod and 3LOB L on EOM. Exited session with pt seated in bed, exit alarm on and call light in reach  Therapy Documentation Precautions:  Precautions Precautions: Fall Precaution Comments: R>L hemi, R inattention. Poor postural control. Restrictions Weight Bearing Restrictions: No General:   Vital Signs: Therapy Vitals Temp: 98.6 F (37 C) Temp Source: Oral Pulse Rate: 86 Resp: 20 BP: 126/75 Patient  Position (if appropriate): Lying Oxygen Therapy SpO2: 96 % O2 Device: Room Air Pain:   ADL: ADL Eating: Minimal assistance Grooming: Moderate assistance Upper Body Bathing: Moderate assistance Where Assessed-Upper Body Bathing: Bed level Lower Body Bathing: Maximal assistance Where Assessed-Lower Body Bathing: Bed level Upper Body Dressing: Moderate assistance Where Assessed-Upper Body Dressing: Bed level Lower Body Dressing: Maximal assistance Where Assessed-Lower Body Dressing: Bed level Toilet Transfer: Not assessed Vision   Perception    Praxis   Exercises:   Other Treatments:     Therapy/Group: Individual Therapy  Tonny Branch 01/25/2020, 6:50 AM

## 2020-01-25 NOTE — Progress Notes (Signed)
Physical Therapy Weekly Progress Note  Patient Details  Name: Alexander Byrd MRN: [id number] Date of Birth: 02-Jun-1950  Beginning of progress report period: January 17, 2020 End of progress report period: January 25, 2020  Today's Date: 01/25/2020 PT Individual Time: 0806-0900 and 1100-1155 PT Individual Time Calculation (min): 54 min and 55 min    Patient has met 4 of 4 short term goals.  Pt has made steady progress towards LTG over the past week. Pt has made mild improvement in midline orientation in sitting and in standing. Mild improvement in coordination also noted on the LUE/LLE allowing pt to perform bed mobility with supervision assist, min assist squat pivot transfers and mod assist gait with PFRW up to 72ft.   Patient continues to demonstrate the following deficits muscle weakness, muscle joint tightness and muscle paralysis, unbalanced muscle activation, motor apraxia, ataxia, decreased coordination and decreased motor planning, decreased attention to left and ideational apraxia, decreased initiation, decreased attention, decreased awareness, decreased problem solving, decreased safety awareness and delayed processing and decreased sitting balance, decreased standing balance, decreased postural control, hemiplegia and decreased balance strategies and therefore will continue to benefit from skilled PT intervention to increase functional independence with mobility.  Patient progressing toward long term goals..  Continue plan of care.  PT Short Term Goals Week 1:  PT Short Term Goal 1 (Week 1): Pt will perform swing pivot transfer with min assist PT Short Term Goal 1 - Progress (Week 1): Met PT Short Term Goal 2 (Week 1): Pt will perform bed mobility with CGA PT Short Term Goal 2 - Progress (Week 1): Progressing toward goal PT Short Term Goal 3 (Week 1): Pt will ambulate with max assist of 1 and LRAD PT Short Term Goal 3 - Progress (Week 1): Met PT Short Term Goal 4 (Week 1): pt  will demonstrate upright posture at bedside and maintain with dynamic movements PT Short Term Goal 4 - Progress (Week 1): Met Week 2:  PT Short Term Goal 1 (Week 2): Pt will trasnfer to [facility] with min assist PT Short Term Goal 1 - Progress (Week 2): Met PT Short Term Goal 2 (Week 2): Pt will maintain standing balance with BUE support up to 1 min and mod assist PT Short Term Goal 2 - Progress (Week 2): Met PT Short Term Goal 3 (Week 2): Pt will ambulate 38ft with RW and mod assist of 1 PT Short Term Goal 3 - Progress (Week 2): Met PT Short Term Goal 4 (Week 2): pt will toerate sitting up in WC up to 2 hours between therapy session PT Short Term Goal 4 - Progress (Week 2): Met Week 3:  PT Short Term Goal 1 (Week 3): STG=LTG due to ELOS  Skilled Therapeutic Interventions/Progress Updates:   Session 1.  Pt received supine in bed and agreeable to PT. NT assisted pt with breakfast and PT obtained PFRW. PT instructed pt in completion of meal sitting up in bed. Bed mobility with supervision assist. Ambulatory transfer to [facility] with PFRW and mod assist. Pt noted to have abduction step with the LLE resulting in L lateal lean. Pt transported to gym. Gait training with PFRW x 1ft with mod assist to guide RW and visual cue to step LLE towards front left leg of RW. Noted improvement in midline with wide step on the L side, but pt still with moderate bias to the L. WC mobility through hall x 155ft without cues or assist. Pt returned to room and performed squat  pivot transfer to bed with UE support on WC and bed rail. Sit>supine completed with min assist on the Pt left supine in bed with call bell in reach and all needs met.   Session 2.  Pt received supine in bed and agreeable to PT. Supine>sit transfer with supervision assist and increased time. Pt self selecting to stabilize with reach towards the L to hold foot of bed. Ambulatory transfer to [place] x 75ft with PFRW mod assist and cues for wide step length L; greatly  improved from previous session.  Pt transported to rehab gym. Gait training to weave through 2 cones with PFRW and mod assist  For turning technique and improved stance width to limit pushers response. Sq quat pivot transfer to nustep with min assist and min cues for set up from PT. BUE/BLE reciprocal endurance training x 6 min level 5, with cues to keep SPM >30 throughout. WC mobility to room without assist from PT< but increased time for obstacles navigation in gym x 182ft. Patient returned to room and left sitting in [facility] with call bell in reach and all needs met.           Therapy Documentation Precautions:  Precautions Precautions: Fall Precaution Comments: R>L hemi, R inattention. Poor postural control. Restrictions Weight Bearing Restrictions: No    Pain:   denies Therapy/Group: Individual Therapy  [name] 01/25/2020, 12:50 PM

## 2020-01-26 DIAGNOSIS — G463 Brain stem stroke syndrome: Secondary | ICD-10-CM

## 2020-01-26 DIAGNOSIS — K5901 Slow transit constipation: Secondary | ICD-10-CM

## 2020-01-26 DIAGNOSIS — D62 Acute posthemorrhagic anemia: Secondary | ICD-10-CM

## 2020-01-26 DIAGNOSIS — I69391 Dysphagia following cerebral infarction: Secondary | ICD-10-CM

## 2020-01-26 NOTE — Progress Notes (Signed)
[facility] PHYSICAL MEDICINE & REHABILITATION PROGRESS NOTE   Subjective/Complaints: Patient seen sitting up in bed this morning eating breakfast with supervision.  He states he slept well overnight.  ROS: Denies CP, SOB, N/V/D,?  Reliability  Objective:   No results found. No results for input(s): WBC, HGB, HCT, PLT in the last 72 hours. No results for input(s): NA, K, CL, CO2, GLUCOSE, BUN, CREATININE, CALCIUM in the last 72 hours.  Intake/Output Summary (Last 24 hours) at 01/26/2020 1305 Last data filed at 01/26/2020 1247 Gross per 24 hour  Intake 338 ml  Output 1 ml  Net 337 ml        Physical Exam: Vital Signs Blood pressure 127/83, pulse 81, temperature 97.8 F (36.6 C), temperature source Oral, resp. rate 16, height 5\' 7"  (1.702 m), weight 69.2 kg, SpO2 97 %. Constitutional: No distress . Vital signs reviewed. HENT: Normocephalic.  Atraumatic. Eyes: EOMI. No discharge. Cardiovascular: No JVD.  RRR. Respiratory: Normal effort.  No stridor.  Bilateral clear to auscultation. GI: Non-distended.  BS +. Skin: Warm and dry.  Intact. Psych: Blunted.  Flat.  Delayed.  Slowed.   Musc: No edema in extremities.  No tenderness in extremities. Neuro: Alert and oriented x1 Motor: 4+/5 throughout  Assessment/Plan: 1. Functional deficits which require 3+ hours per day of interdisciplinary therapy in a comprehensive inpatient rehab setting.  Physiatrist is providing close team supervision and 24 hour management of active medical problems listed below.  Physiatrist and rehab team continue to assess barriers to discharge/monitor patient progress toward functional and medical goals  Care Tool:  Bathing    Body parts bathed by patient: Chest,Abdomen,Face,Right arm,Left arm,Right upper leg,Left upper leg,Right lower leg,Front perineal area,Left lower leg   Body parts bathed by helper: Buttocks Body parts n/a: Buttocks,Front perineal area (pt previously cleansed)   Bathing assist  Assist Level: Moderate Assistance - Patient 50 - 74%     Upper Body Dressing/Undressing Upper body dressing   What is the patient wearing?: Pull over shirt    Upper body assist Assist Level: Supervision/Verbal cueing    Lower Body Dressing/Undressing Lower body dressing      What is the patient wearing?: Pants,Incontinence brief     Lower body assist Assist for lower body dressing: Moderate Assistance - Patient 50 - 74%     Toileting Toileting Toileting Activity did not occur (Clothing management and hygiene only): N/A (no void or bm)  Toileting assist Assist for toileting: Total Assistance - Patient < 25%     Transfers Chair/bed transfer  Transfers assist  Chair/bed transfer activity did not occur: Safety/medical concerns  Chair/bed transfer assist level: Minimal Assistance - Patient > 75% (squat pivot)     Locomotion Ambulation   Ambulation assist      Assist level: 2 helpers   Max distance: 52'   Walk 10 feet activity   Assist  Walk 10 feet activity did not occur: Safety/medical concerns        Walk 50 feet activity   Assist Walk 50 feet with 2 turns activity did not occur: Safety/medical concerns         Walk 150 feet activity   Assist Walk 150 feet activity did not occur: Safety/medical concerns         Walk 10 feet on uneven surface  activity   Assist Walk 10 feet on uneven surfaces activity did not occur: Safety/medical concerns         Wheelchair     Assist Will  patient use wheelchair at discharge?: Yes Type of Wheelchair: Manual    Wheelchair assist level: Supervision/Verbal cueing Max wheelchair distance: 134ft    Wheelchair 50 feet with 2 turns activity    Assist        Assist Level: Supervision/Verbal cueing   Wheelchair 150 feet activity     Assist      Assist Level: Supervision/Verbal cueing   Blood pressure 127/83, pulse 81, temperature 97.8 F (36.6 C), temperature source Oral,  resp. rate 16, height 5\' 7"  (1.702 m), weight 69.2 kg, SpO2 97 %.  Medical Problem List and Plan: 1.  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  Continue CIR 2.  Antithrombotics: -DVT/anticoagulation: Lovenox             -antiplatelet therapy: Aspirin 81 mg daily and Plavix 75 mg daily x3 months then aspirin daily 3. Pain Management: Continue Tylenol as needed  RIght foot x-ray unremarkable  Controlled on 1/1 4. Mood: Provide emotional support             -antipsychotic agents: N/A 5. 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 Skin/Wound Care: Routine skin checks.  7. Fluids/Electrolytes/Nutrition: Routine and outs.  BMP within acceptable range on 12/20, labs ordered for Monday 8.  Hypertension.  Continue Norvasc 5 mg daily.                   Vitals:   01/25/20 1951 01/26/20 0342  BP: 135/90 127/83  Pulse: 89 81  Resp: 20 16  Temp: 98 F (36.7 C) 97.8 F (36.6 C)  SpO2: 94% 97%   Controlled on 1/1  9.  Hyperlipidemia: Lipitor 10.  BPH.  Continue Flomax 0.4 mg daily.               PVRs previously showing retention, will recheck and DC if within normal limits 11.  Severe sepsis with acute enteritis.  Completed course of azithromycin.  Monitor hydration. 12.  AKI: resolved.  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 Nausea: continue zofran prn  15.  Constipation cont.  Senna S  Improving 16. Tachycardia: intermittent, continue to monitor. 17.  Acute blood loss anemia  Hemoglobin 11.6 on 12/20, labs ordered for Monday  LOS: 17 days A FACE TO FACE EVALUATION WAS PERFORMED  Acire Tang Wednesday 01/26/2020, 1:05 PM

## 2020-01-27 ENCOUNTER — Inpatient Hospital Stay (HOSPITAL_COMMUNITY): Payer: Medicare Other | Admitting: Occupational Therapy

## 2020-01-27 DIAGNOSIS — D62 Acute posthemorrhagic anemia: Secondary | ICD-10-CM

## 2020-01-27 DIAGNOSIS — R338 Other retention of urine: Secondary | ICD-10-CM

## 2020-01-27 DIAGNOSIS — M79671 Pain in right foot: Secondary | ICD-10-CM

## 2020-01-27 DIAGNOSIS — I69391 Dysphagia following cerebral infarction: Secondary | ICD-10-CM

## 2020-01-27 DIAGNOSIS — N401 Enlarged prostate with lower urinary tract symptoms: Secondary | ICD-10-CM

## 2020-01-27 DIAGNOSIS — G8929 Other chronic pain: Secondary | ICD-10-CM

## 2020-01-27 DIAGNOSIS — I10 Essential (primary) hypertension: Secondary | ICD-10-CM

## 2020-01-27 NOTE — Progress Notes (Signed)
[facility] PHYSICAL MEDICINE & REHABILITATION PROGRESS NOTE   Subjective/Complaints: Patient seen sitting up in bed this morning.  He states he slept well overnight.  No reported issues overnight.  ROS: Denies CP, SOB, N/V/D,?  Reliability due to cognition  Objective:   No results found. No results for input(s): WBC, HGB, HCT, PLT in the last 72 hours. No results for input(s): NA, K, CL, CO2, GLUCOSE, BUN, CREATININE, CALCIUM in the last 72 hours.  Intake/Output Summary (Last 24 hours) at 01/27/2020 1704 Last data filed at 01/27/2020 1303 Gross per 24 hour  Intake 598 ml  Output 450 ml  Net 148 ml        Physical Exam: Vital Signs Blood pressure 134/78, pulse 97, temperature 100.1 F (37.8 C), temperature source Oral, resp. rate 18, height 5\' 7"  (1.702 m), weight 69.2 kg, SpO2 97 %.  Constitutional: No distress . Vital signs reviewed. HENT: Normocephalic.  Atraumatic. Eyes: EOMI. No discharge. Cardiovascular: No JVD.  RRR. Respiratory: Normal effort.  No stridor.  Bilateral clear to auscultation. GI: Non-distended.  BS +. Skin: Warm and dry.  Intact. Psych: Blunted.  Flat.  Delayed.   Musc: No edema in extremities.  No tenderness in extremities. Neuro: Alert and oriented x1 Motor: 4+/5 throughout, unchanged  Assessment/Plan: 1. Functional deficits which require 3+ hours per day of interdisciplinary therapy in a comprehensive inpatient rehab setting.  Physiatrist is providing close team supervision and 24 hour management of active medical problems listed below.  Physiatrist and rehab team continue to assess barriers to discharge/monitor patient progress toward functional and medical goals  Care Tool:  Bathing    Body parts bathed by patient: Chest,Abdomen,Face,Right arm,Left arm,Right upper leg,Left upper leg,Right lower leg,Front perineal area,Left lower leg   Body parts bathed by helper: Buttocks Body parts n/a: Buttocks,Front perineal area (pt previously  cleansed)   Bathing assist Assist Level: Moderate Assistance - Patient 50 - 74%     Upper Body Dressing/Undressing Upper body dressing   What is the patient wearing?: Pull over shirt    Upper body assist Assist Level: Supervision/Verbal cueing    Lower Body Dressing/Undressing Lower body dressing      What is the patient wearing?: Pants,Incontinence brief     Lower body assist Assist for lower body dressing: Moderate Assistance - Patient 50 - 74%     Toileting Toileting Toileting Activity did not occur (Clothing management and hygiene only): N/A (no void or bm)  Toileting assist Assist for toileting: Total Assistance - Patient < 25%     Transfers Chair/bed transfer  Transfers assist  Chair/bed transfer activity did not occur: Safety/medical concerns  Chair/bed transfer assist level: Minimal Assistance - Patient > 75% (squat pivot)     Locomotion Ambulation   Ambulation assist      Assist level: 2 helpers   Max distance: 52'   Walk 10 feet activity   Assist  Walk 10 feet activity did not occur: Safety/medical concerns        Walk 50 feet activity   Assist Walk 50 feet with 2 turns activity did not occur: Safety/medical concerns         Walk 150 feet activity   Assist Walk 150 feet activity did not occur: Safety/medical concerns         Walk 10 feet on uneven surface  activity   Assist Walk 10 feet on uneven surfaces activity did not occur: Safety/medical concerns         Wheelchair  Assist Will patient use wheelchair at discharge?: Yes Type of Wheelchair: Manual    Wheelchair assist level: Supervision/Verbal cueing Max wheelchair distance: 152ft    Wheelchair 50 feet with 2 turns activity    Assist        Assist Level: Supervision/Verbal cueing   Wheelchair 150 feet activity     Assist      Assist Level: Supervision/Verbal cueing   Blood pressure 134/78, pulse 97, temperature 100.1 F (37.8 C),  temperature source Oral, resp. rate 18, height 5\' 7"  (1.702 m), weight 69.2 kg, SpO2 97 %.  Medical Problem List and Plan: 1.  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  Continue CIR 2.  Antithrombotics: -DVT/anticoagulation: Lovenox             -antiplatelet therapy: Aspirin 81 mg daily and Plavix 75 mg daily x3 months then aspirin daily 3. Pain Management: Continue Tylenol as needed  RIght foot x-ray unremarkable  Controlled on 1/2 4. Mood: Provide emotional support             -antipsychotic agents: N/A 5. 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 Skin/Wound Care: Routine skin checks.  7. Fluids/Electrolytes/Nutrition: Routine and outs.  BMP within acceptable range on 12/20, labs ordered for tomorrow 8.  Hypertension.  Continue Norvasc 5 mg daily.                   Vitals:   01/27/20 0900 01/27/20 1428  BP: (!) 143/76 134/78  Pulse: 91 97  Resp: 16 18  Temp:  100.1 F (37.8 C)  SpO2:  97%   Controlled on 1/1  9.  Hyperlipidemia: Lipitor 10.  BPH.  Continue Flomax 0.4 mg daily, increased on 1/2.               PVRs x1 showing retention with incontinence 11.  Severe sepsis with acute enteritis.  Completed course of azithromycin.  Monitor hydration. 12.  AKI: resolved.  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 Nausea: continue zofran prn  15.  Constipation cont.  Senna S  Improving 16. Tachycardia: intermittent, continue to monitor. 17.  Acute blood loss anemia  Hemoglobin 11.6 on 12/20, labs ordered for tomorrow  LOS: 18 days A FACE TO FACE EVALUATION WAS PERFORMED  Latisia Hilaire 1/21 01/27/2020, 5:04 PM

## 2020-01-27 NOTE — Progress Notes (Signed)
Occupational Therapy Weekly Progress Note  Patient Details  Name: Alexander Byrd MRN: [id number] Date of Birth: Apr 16, 1950  Beginning of progress report period: 01/18/2020 End of progress report period: 01/27/2020  Today's Date: 01/27/2020 OT Individual Time: 1219-7588 OT Individual Time Calculation (min): 26 min   Patient has met 2 of 4 short term goals.    Pt has made slow progress at time of report. 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 At present, pt can require anywhere from Min-Max A for functional sitting balance, Mod A for sit<stands at sink and for squat pivot transfers. He requires Max A for stand pivots using the [place] with max cuing for sequencing of transfer and facilitation for weight shifting. He will continue to benefit from skilled OT to improve deficit areas stated below to allow for greater functional independence in prep for d/c home on 1/8.   Patient continues to demonstrate the following deficits: muscle weakness, decreased cardiorespiratoy endurance, unbalanced muscle activation, decreased coordination and decreased motor planning, decreased midline orientation and decreased attention to right, decreased initiation, decreased attention, decreased awareness, decreased problem solving and delayed processing and decreased sitting balance, decreased standing balance, decreased postural control and decreased balance strategies and therefore will continue to benefit from skilled OT intervention to enhance overall performance with BADL.  Patient progressing toward most long term goals..  Continue plan of care. See POC for adjustment of 1 LTG.   OT Short Term Goals Week 2:  OT Short Term Goal 1 (Week 2): Pt will maintain static sit at EOB with min A to engage in self care. OT Short Term Goal 1 - Progress (Week 2): Progressing toward goal OT Short Term Goal 2  (Week 2): Pt will complete sit to stand at the sink with LB bathing tasks and mod assist for two consecutive sessions. OT Short Term Goal 2 - Progress (Week 2): Met OT Short Term Goal 3 (Week 2): Pt will complete stand pivot transfers to the [facility] with mod assist. OT Short Term Goal 3 - Progress (Week 2): Progressing toward goal OT Short Term Goal 4 (Week 2): Pt will donn brief and pants during LB dressing with overall mod assist sit to stand. OT Short Term Goal 4 - Progress (Week 2): Met Week 3:  OT Short Term Goal 1 (Week 3): STGs=LTGs set at Min A overall  Skilled Therapeutic Interventions/Progress Updates:    Pt greeted in bed with no c/o pain. Declining toileting, agreeable to start session by donning his pants. Min A for transition to EOB to scoot the Rt hip forward. Pt required max manual and max demonstrational cuing to improve upright alignment due to Lt pushing/LOBs. Pt required assistance for problem solving when both LEs were threaded through the same pant hole. Mod A for sit<stand using the PFRW and Min balance assist while pt removed unilateral support from device to elevate his pants. Max A for stand pivot<w/c using PFRW with max cues for sequencing and facilitation for weight shifting, increased time provided for processing as well. Once sitting, pt required cuing to visually attend to mirror to improve his posture during handwashing. Sit<stands with Min-Mod A at the sink, pt once again using the mirror to increase his awareness of midline. Able to maintain standing with CGA for at max 5 seconds before onset of Lt lean/push resulting in requirement of Mod balance assist and increased cuing. At end of session pt remained  sitting up in the w/c, all needs within reach and safety belt fastened.   Therapy Documentation Precautions:  Precautions Precautions: Fall Precaution Comments: R>L hemi, R inattention. Poor postural control. Restrictions Weight Bearing Restrictions: No Vital  Signs: Therapy Vitals Temp: 97.9 F (36.6 C) Temp Source: Oral Pulse Rate: 84 Resp: 20 BP: 124/87 Patient Position (if appropriate): Lying Oxygen Therapy SpO2: 94 % O2 Device: Room Air ADL: ADL Eating: Minimal assistance Grooming: Moderate assistance Upper Body Bathing: Moderate assistance Where Assessed-Upper Body Bathing: Bed level Lower Body Bathing: Maximal assistance Where Assessed-Lower Body Bathing: Bed level Upper Body Dressing: Moderate assistance Where Assessed-Upper Body Dressing: Bed level Lower Body Dressing: Maximal assistance Where Assessed-Lower Body Dressing: Bed level Toilet Transfer: Not assessed      Therapy/Group: Individual Therapy  Kimsey Demaree A Bradden Tadros 01/27/2020, 7:07 AM

## 2020-01-27 NOTE — Progress Notes (Signed)
Patient had a good day today.  Patient still having a flat affect but is cooperative and follows directions.  Patient had no complaints of pain during the shift.

## 2020-01-27 NOTE — Plan of Care (Signed)
  Problem: RH Balance Goal: LTG: Patient will maintain dynamic sitting balance (OT) Description: LTG:  Patient will maintain dynamic sitting balance with assistance during activities of daily living (OT) Flowsheets (Taken 01/27/2020 1043) LTG: Pt will maintain dynamic sitting balance during ADLs with: Minimal Assistance - Patient > 75% Note: Goal downgraded due to pts fluctuating balance assist and slower than anticipated progress

## 2020-01-28 ENCOUNTER — Encounter (HOSPITAL_COMMUNITY): Payer: Medicare Other | Admitting: Occupational Therapy

## 2020-01-28 ENCOUNTER — Inpatient Hospital Stay (HOSPITAL_COMMUNITY): Payer: Medicare Other

## 2020-01-28 ENCOUNTER — Inpatient Hospital Stay (HOSPITAL_COMMUNITY): Payer: Medicare Other | Admitting: Speech Pathology

## 2020-01-28 ENCOUNTER — Ambulatory Visit (HOSPITAL_COMMUNITY): Payer: Medicare Other

## 2020-01-28 LAB — CBC WITH DIFFERENTIAL/PLATELET
Abs Immature Granulocytes: 0.01 10*3/uL (ref 0.00–0.07)
Basophils Absolute: 0.1 10*3/uL (ref 0.0–0.1)
Basophils Relative: 1 %
Eosinophils Absolute: 0.1 10*3/uL (ref 0.0–0.5)
Eosinophils Relative: 1 %
HCT: 38.8 % — ABNORMAL LOW (ref 39.0–52.0)
Hemoglobin: 12.9 g/dL — ABNORMAL LOW (ref 13.0–17.0)
Immature Granulocytes: 0 %
Lymphocytes Relative: 46 %
Lymphs Abs: 1.9 10*3/uL (ref 0.7–4.0)
MCH: 29.9 pg (ref 26.0–34.0)
MCHC: 33.2 g/dL (ref 30.0–36.0)
MCV: 90 fL (ref 80.0–100.0)
Monocytes Absolute: 0.3 10*3/uL (ref 0.1–1.0)
Monocytes Relative: 7 %
Neutro Abs: 1.8 10*3/uL (ref 1.7–7.7)
Neutrophils Relative %: 45 %
Platelets: 239 10*3/uL (ref 150–400)
RBC: 4.31 MIL/uL (ref 4.22–5.81)
RDW: 13.6 % (ref 11.5–15.5)
WBC: 4 10*3/uL (ref 4.0–10.5)
nRBC: 0 % (ref 0.0–0.2)

## 2020-01-28 LAB — BASIC METABOLIC PANEL
Anion gap: 9 (ref 5–15)
BUN: 7 mg/dL — ABNORMAL LOW (ref 8–23)
CO2: 24 mmol/L (ref 22–32)
Calcium: 9.5 mg/dL (ref 8.9–10.3)
Chloride: 108 mmol/L (ref 98–111)
Creatinine, Ser: 1.04 mg/dL (ref 0.61–1.24)
GFR, Estimated: >60 mL/min (ref >60)
Glucose, Bld: 130 mg/dL — ABNORMAL HIGH (ref 70–99)
Potassium: 3.9 mmol/L (ref 3.5–5.1)
Sodium: 141 mmol/L (ref 135–145)

## 2020-01-28 MED ORDER — POTASSIUM CHLORIDE CRYS ER 10 MEQ PO TBCR
40.0000 meq | EXTENDED_RELEASE_TABLET | Freq: Two times a day (BID) | ORAL | Status: DC
  Administered 2020-01-28 – 2020-02-02 (×10): 40 meq via ORAL
  Filled 2020-01-28 (×10): qty 4

## 2020-01-28 NOTE — Progress Notes (Signed)
Occupational Therapy Session Note  Patient Details  Name: Alexander Byrd MRN: [id number] Date of Birth: 1951/01/04  Today's Date: 01/28/2020 OT Individual Time: 1303-1403 OT Individual Time Calculation (min): 60 min    Short Term Goals: Week 3:  OT Short Term Goal 1 (Week 3): STGs=LTGs set at [employer] A overall  Skilled Therapeutic Interventions/Progress Updates:    Pt in wheelchair to start session, agreeable to therapy.  Family in for education as well with pt's ex wife and sister present.  Took pt down via wheelchair to the tub room where he practiced several transfers squat pivot to the tub bench as well as one transfer to the drop arm commode.  He was able to complete transfers to all surfaces with min assist.  Had both of his family complete transfers to and from the tub bench with max instructional cueing for technique and sequencing to setup wheelchair, scoot out to the edge, and for completion of [place] method transfer.  They also completed sit to stand X1 each for simulated clothing management for toileting tasks.  Mod assist to complete standing with increased posterior lean as well as slight left pushing noted.  Returned to the room at the end of the session with pt left sitting up for PT session coming next.  Will continue with more education tomorrow as family plans to come back.  Safety belt in place with call button in reach.   Therapy Documentation Precautions:  Precautions Precautions: Fall Precaution Comments: R>L hemi, R inattention. Poor postural control. Restrictions Weight Bearing Restrictions: No  Pain: Pain Assessment Pain Scale: Faces Pain Score: 0-No pain ADL: See Care Tool Section for some details of mobility and selfcare  Therapy/Group: Individual Therapy  Kauan Kloosterman OTR/L 01/28/2020, 3:53 PM

## 2020-01-28 NOTE — Plan of Care (Signed)
  Problem: RH Awareness Goal: LTG: Patient will demonstrate awareness during functional activites type of (SLP) Description: LTG: Patient will demonstrate awareness during functional activites type of (SLP) Flowsheets (Taken 01/28/2020 1230) LTG: Patient will demonstrate awareness during cognitive/linguistic activities with assistance of (SLP): Minimal Assistance - Patient > 75% Note: Downgraded due to slower than anticipated progress

## 2020-01-28 NOTE — Progress Notes (Signed)
Physical Therapy Session Note  Patient Details  Name: Alexander Byrd MRN: [id number] Date of Birth: Apr 12, 1950  Today's Date: 01/28/2020 PT Individual Time: 1417-1510 PT Individual Time Calculation (min): 53 min   Short Term Goals: Week 2:  PT Short Term Goal 1 (Week 2): Pt will trasnfer to [facility] with min assist PT Short Term Goal 1 - Progress (Week 2): Met PT Short Term Goal 2 (Week 2): Pt will maintain standing balance with BUE support up to 1 min and mod assist PT Short Term Goal 2 - Progress (Week 2): Met PT Short Term Goal 3 (Week 2): Pt will ambulate 33f with RW and mod assist of 1 PT Short Term Goal 3 - Progress (Week 2): Met PT Short Term Goal 4 (Week 2): pt will toerate sitting up in WC up to 2 hours between therapy session PT Short Term Goal 4 - Progress (Week 2): Met Week 3:  PT Short Term Goal 1 (Week 3): STG=LTG due to ELOS  Skilled Therapeutic Interventions/Progress Updates:      Therapy Documentation Precautions:  Precautions Precautions: Fall Precaution Comments: R>L hemi, R inattention. Poor postural control. Restrictions Weight Bearing Restrictions: No General:   Patient seated upright in w/c upon PT arrival. Sister and SO are present. Patient alert and agreeable to PT session. Patient denied pain during session.  Therapeutic Activity:  Family/ Pt Education: Therapy treatment session with focus on family education as pt is scheduled to return home this Saturday 02/02/2020. Family had practiced shower transfer with OT earlier in afternoon and stated that the transfer went well with pt performing most of effort. Family is also familiar with pt's ability to mobilize in bed with supervision and requiring SBA/ SUP and extra time in order to transition supine-->sit. Education provided re: technique for car transfer. Car in therapy gym adjusted to sister's judgement as to height of personal minivan which will be used for home transport on Sat. Demonstrated with  instructions to family for pt to pivot from w/c to car with use of door handle and seat for BUE support. Min A provided for transfer, however pt impulsive and performs first transfer from wrote memory and attempted to step into car instead of leading with pivot to sit. Return to w/c with SO assist and she is able to perform well with proper hand placement, use of gait belt and provides Min A to guide pt into w/c seat. Sister is also able to assist with in/ out of car providing CGA/ Min A for transfer and requiring minimal instruction for technique. Family reminded to allow pt to perform when he is ready and with pt initiating and performing as much of the work as possible in order for continued improvement toward independence. Also reminded that pt will be safest at w/c level once home and will require additional therapy prior to ambulation in the home. Family with few questions throughout session that were addressed and answered. Family also requesting addition of shower chair and hospital bed to DME for d/c. Therapy supervisor addressing new requests.  Bed Mobility and transfers: Patient's sister and SO provided CGA/ min A for transfer w/c to bed, and Min A for LLE into supine.   Wheelchair Mobility:  Patient propelled wheelchair from therapy gym car and back to room with supervision requiring no cues to complete and with no issues. Removed pt's footrests in order to facilitate BUE and BLE use to propel chair as this may be necessary to propel chair over carpet in the  home. However, pt continued to use BUE only. Once in room, pt is again able to demonstrate ability to position self at bedside and lock brakes prior to family providing assist back to bed. Only one instance of verbal cue required to locate R w/c brake handle.  Patient in bed at end of session with alarm set and all needs within reach.     Therapy/Group: Individual Therapy  Abbie Sons, PT, DPT, CBIS  01/28/2020,  3:42 PM

## 2020-01-28 NOTE — Progress Notes (Signed)
Speech Language Pathology Daily Session Note  Patient Details  Name: Alexander Byrd MRN: [id number] Date of Birth: 1950-07-14  Today's Date: 01/28/2020 SLP Individual Time: 1100-1155 SLP Individual Time Calculation (min): 55 min  Short Term Goals: Week 3: SLP Short Term Goal 1 (Week 3): STG=LTG due to remaining length of stay  Skilled Therapeutic Interventions: Pt was seen for skilled ST targeting dysphagia and cognitive goals. SLP facilitated session with set up assist and Supervision A verbal cues for thoroughness with oral care via suction brush. Pt then accepted upgraded trials of ice and thin H2O. He exhibited 1 cough and 2 subtle throat clears out of 10 ice chip presentations. Self fed cup sips of thin H2O (total of ~6oz intake) only resulted in 1 throat clear, although swallows noted to be audible. Pt is making good progress toward repeat MBSS this week prior to d/c. SLP further facilitated session with a novel PEG board task. Pt recreated designs from photographs on a PEG board with Supervision A verbal and visual cueing for error awareness and problem solving with very basic designs, however increased Min A cueing required to detect and correct mistakes within more mildly complex designs. Pt sustained his attention without need for cues to redirect today. He did require Min A for functional recall of recent events. Pt left sitting in chair with alarm set and needs within reach. Continue per current plan of care.          Pain Pain Assessment Pain Scale: 0-10 Pain Score: 0-No pain  Therapy/Group: Individual Therapy  [name] 01/28/2020, 11:59 AM

## 2020-01-28 NOTE — Progress Notes (Signed)
[facility] PHYSICAL MEDICINE & REHABILITATION PROGRESS NOTE   Subjective/Complaints:  No issues overnight , pt states he still has accidents with bowel and bladder .  No pain c/os  ROS: Denies CP, SOB, N/V/D,?  Reliability due to cognition  Objective:   No results found. No results for input(s): WBC, HGB, HCT, PLT in the last 72 hours. No results for input(s): NA, K, CL, CO2, GLUCOSE, BUN, CREATININE, CALCIUM in the last 72 hours.  Intake/Output Summary (Last 24 hours) at 01/28/2020 0847 Last data filed at 01/28/2020 0734 Gross per 24 hour  Intake 600 ml  Output 325 ml  Net 275 ml        Physical Exam: Vital Signs Blood pressure 112/81, pulse 90, temperature 98.5 F (36.9 C), temperature source Oral, resp. rate 18, height 5\' 7"  (1.702 m), weight 69.2 kg, SpO2 95 %.   General: No acute distress Mood and affect are appropriate Heart: Regular rate and rhythm no rubs murmurs or extra sounds Lungs: Clear to auscultation, breathing unlabored, no rales or wheezes Abdomen: Positive bowel sounds, soft nontender to palpation, nondistended Extremities: No clubbing, cyanosis, or edema Skin: No evidence of breakdown, no evidence of rash   Psych: Blunted.  Flat.  Delayed.   Musc: No edema in extremities.  No tenderness in extremities. Neuro: Alert and oriented x1 Motor: 4+/5 throughout, unchanged  Assessment/Plan: 1. Functional deficits which require 3+ hours per day of interdisciplinary therapy in a comprehensive inpatient rehab setting.  Physiatrist is providing close team supervision and 24 hour management of active medical problems listed below.  Physiatrist and rehab team continue to assess barriers to discharge/monitor patient progress toward functional and medical goals  Care Tool:  Bathing    Body parts bathed by patient: Chest,Abdomen,Face,Right arm,Left arm,Right upper leg,Left upper leg,Right lower leg,Front perineal area,Left lower leg   Body parts bathed by  helper: Buttocks Body parts n/a: Buttocks,Front perineal area (pt previously cleansed)   Bathing assist Assist Level: Moderate Assistance - Patient 50 - 74%     Upper Body Dressing/Undressing Upper body dressing   What is the patient wearing?: Pull over shirt    Upper body assist Assist Level: Supervision/Verbal cueing    Lower Body Dressing/Undressing Lower body dressing      What is the patient wearing?: Pants,Incontinence brief     Lower body assist Assist for lower body dressing: Moderate Assistance - Patient 50 - 74%     Toileting Toileting Toileting Activity did not occur (Clothing management and hygiene only): N/A (no void or bm)  Toileting assist Assist for toileting: Total Assistance - Patient < 25%     Transfers Chair/bed transfer  Transfers assist  Chair/bed transfer activity did not occur: Safety/medical concerns  Chair/bed transfer assist level: Minimal Assistance - Patient > 75% (squat pivot)     Locomotion Ambulation   Ambulation assist      Assist level: 2 helpers   Max distance: 52'   Walk 10 feet activity   Assist  Walk 10 feet activity did not occur: Safety/medical concerns        Walk 50 feet activity   Assist Walk 50 feet with 2 turns activity did not occur: Safety/medical concerns         Walk 150 feet activity   Assist Walk 150 feet activity did not occur: Safety/medical concerns         Walk 10 feet on uneven surface  activity   Assist Walk 10 feet on uneven surfaces activity did  not occur: Safety/medical concerns         Wheelchair     Assist Will patient use wheelchair at discharge?: Yes Type of Wheelchair: Manual    Wheelchair assist level: Supervision/Verbal cueing Max wheelchair distance: 139ft    Wheelchair 50 feet with 2 turns activity    Assist        Assist Level: Supervision/Verbal cueing   Wheelchair 150 feet activity     Assist      Assist Level:  Supervision/Verbal cueing   Blood pressure 112/81, pulse 90, temperature 98.5 F (36.9 C), temperature source Oral, resp. rate 18, height 5\' 7"  (1.702 m), weight 69.2 kg, SpO2 95 %.  Medical Problem List and Plan: 1.  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  Continue CIR 2.  Antithrombotics: -DVT/anticoagulation: Lovenox             -antiplatelet therapy: Aspirin 81 mg daily and Plavix 75 mg daily x3 months then aspirin daily 3. Pain Management: Continue Tylenol as needed  RIght foot x-ray unremarkable  Controlled on 1/2 4. Mood: Provide emotional support             -antipsychotic agents: N/A 5. 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 Skin/Wound Care: Routine skin checks.  7. Fluids/Electrolytes/Nutrition: Routine and outs.  BMP within acceptable range on 12/20, labs ordered for tomorrow 8.  Hypertension.  Continue Norvasc 5 mg daily.                   Vitals:   01/27/20 1954 01/28/20 0419  BP: (!) 152/94 112/81  Pulse: 92 90  Resp: 18 18  Temp: 98.5 F (36.9 C)   SpO2: 96% 95%   Controlled on 1/3  9.  Hyperlipidemia: Lipitor 10.  BPH.  Continue Flomax 0.4 mg daily, increased on 1/2.               PVRs x1 showing retention with incontinence 11.  Severe sepsis with acute enteritis.  Completed course of azithromycin.  Monitor hydration. 12.  AKI: resolved.  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 Nausea: continue zofran prn  15.  Constipation cont.  Senna S  Improving 16. Tachycardia:Mild  intermittent, continue to monitor. 17.  Acute blood loss anemia  Hemoglobin 11.6 on 12/20, labs ordered for tomorrow  LOS: 19 days A FACE TO FACE EVALUATION WAS PERFORMED  1/21 01/28/2020, 8:47 AM

## 2020-01-28 NOTE — Progress Notes (Addendum)
Physical Therapy Session Note  Patient Details  Name: Alexander Byrd MRN: [id number] Date of Birth: Jun 22, 1950  Today's Date: 01/28/2020 PT Individual Time: 0930-1005 PT Individual Time Calculation (min): 35 min   Short Term Goals: Week 3:  PT Short Term Goal 1 (Week 3): STG=LTG due to ELOS  Skilled Therapeutic Interventions/Progress Updates:    Patient resting comfortably in supine upon PT arrival. Patient alert and agreeable to PT session. Patient denied pain during session. Although family education time is scheduled for afternoon session, pt is unaware if anyone will be present.   Therapeutic Activity: Bed Mobility: Patient performed supine to/from sit with supervision requiring extra time to complete. Reach of L hand to footboard with (I) and extra time/ effort. Provided minimal verbal cues to perform forward scoot to EOB.  Transfers:  Info gleaned re: home setup and education provided throughout session re: LOA needed from family as well as positioning of w/c for improved transfers as well as using slower, controlled movements during all transfers in order to increase mm strength, coordination, and control. Patient performed sit to/from stand several times during session with some impulsivity. Req'd Min A to stand from bed and from w/c. Then increased to ModA from lower, very compliant couch in ADL suite. Provided verbal and some tactile cues for foot placement, weight shift, and pivot technique. Despite verbal cueing for Stand pivot, pt performs squat pivot to/ from couch requiring Mod A for balance to complete distance.   Wheelchair Mobility:  Facilitated w/c mobility in small space for 180 degree turn and maneuvering self to edge of couch in ADL suite. Pt is able to perform with SUP and no vc. He is very attentive to specific positioning for good transfer setup. Extra time for finding and setting brakes with no cueing.   Neuromuscular Re-ed: NM Re-ed facilitated with vc and tactile  cueing and facilitation of R quad during transfers to encourage increased mm fiber activity and improved extension for improved balance and transfer performance. Sitting balance improved from previous week. Dynamic standing balance continues to require extensive cueing and Mod A to maintain with BUE support.    Patient in room seated upright in w/c at end of session with breaks locked, chair alarm set, and all needs within reach.   Therapy Documentation Precautions:  Precautions Precautions: Fall Precaution Comments: R>L hemi, R inattention. Poor postural control. Restrictions Weight Bearing Restrictions: No        Therapy/Group: Individual Therapy  Harolyn Rutherford, PT, DPT, CBIS  01/28/2020, 12:06 PM

## 2020-01-29 ENCOUNTER — Encounter (HOSPITAL_COMMUNITY): Payer: Medicare Other | Admitting: Speech Pathology

## 2020-01-29 ENCOUNTER — Encounter (HOSPITAL_COMMUNITY): Payer: Medicare Other | Admitting: Occupational Therapy

## 2020-01-29 ENCOUNTER — Inpatient Hospital Stay (HOSPITAL_COMMUNITY): Payer: Medicare Other

## 2020-01-29 ENCOUNTER — Ambulatory Visit (HOSPITAL_COMMUNITY): Payer: Medicare Other

## 2020-01-29 ENCOUNTER — Inpatient Hospital Stay (HOSPITAL_COMMUNITY): Payer: Medicare Other | Admitting: Occupational Therapy

## 2020-01-29 NOTE — Progress Notes (Signed)
Occupational Therapy Session Note  Patient Details  Name: Alexander Byrd MRN: [id number] Date of Birth: 1950/02/03  Today's Date: 01/29/2020 OT Individual Time: 1118-1200 OT Individual Time Calculation (min): 42 min    Short Term Goals: Week 3:  OT Short Term Goal 1 (Week 3): STGs=LTGs set at Min A overall  Skilled Therapeutic Interventions/Progress Updates:    Session 1: (2637-8588) Pt in bed to start session, agreeable to working on washing up at the sink.  He was able to transfer to the EOB with min assist and then scoot to the edge at the same level.  He completed squat pivot transfer to the wheelchair at min assist as well and was positioned at the sink for bathing and dressing tasks.  Increased posterior lean and LOB in the wheelchair during bathing as well as increased lean to the left.  He was able to remove his pullover shirt with supervision.  Mod assist was needed for removing pants sit to stand.  He completed UB bathing and dressing with supervision from supported position.  He then completed LB bathing and dressing at overall mod assist secondary to pushing to the left in standing as well as increased left knee flexion.  He demonstrates decreased ability to reach the LLE for donning his sock secondary, so therapist assisted with starting it.  He was able to finish it and then donn the left with setup.  Finished session with pt sitting up in the wheelchair with the call button and phone in reach and safety belt in place.    Session 2:  (5027-7412).  Pt in wheelchair to start session.  Had him complete sit to stand and static standing at the sink to start for work on standing balance in preparation for family education once his sister and another caregiver came.  He was able to complete sit to stand and stand at the sink with min assist using BUEs supported on the sink.  Family and friend came in for continued education so worked on toilet transfers and standing for toilet hygiene.  He  was able to complete squat pivot transfers to the drop arm commode with min assist with therapist and with one caregiver who had not practiced yet.  He was able to complete sit to stand and standing with therapist as well as both caregivers at [facility] assist level, while managing clothing up and down for toileting.  Min instructional cueing was given for technique with pt needing max instructional cueing to sequence through the task and to assist with pulling up and pushing down his pants.  Finished session with transfer back to the wheelchair squat pivot min assist.  Will continue with further education tomorrow with shower and dressing planned.  Pt left in the wheelchair with family present in preparation for speech therapy next.    Therapy Documentation Precautions:  Precautions Precautions: Fall Precaution Comments: R>L hemi, R inattention. Poor postural control. Restrictions Weight Bearing Restrictions: No  Pain: Pain Assessment Pain Scale: 0-10 Pain Score: 0-No pain ADL: See Care Tool Section for some details of mobility and selfcare  Therapy/Group: Individual Therapy  Arial Galligan OTR/L 01/29/2020, 12:33 PM

## 2020-01-29 NOTE — Plan of Care (Signed)
  Problem: RH Balance Goal: LTG Patient will maintain dynamic standing with ADLs (OT) Description: LTG:  Patient will maintain dynamic standing balance with assist during activities of daily living (OT)  Flowsheets (Taken 01/29/2020 1243) LTG: Pt will maintain dynamic standing balance during ADLs with: (Goal downgraded based on progress.) Moderate Assistance - Patient 50 - 74% Note: Goal downgraded based on progress.   Problem: RH Bathing Goal: LTG Patient will bathe all body parts with assist levels (OT) Description: LTG: Patient will bathe all body parts with assist levels (OT) Flowsheets (Taken 01/29/2020 1243) LTG: Pt will perform bathing with assistance level/cueing: (Goal downgraded based on progress.) Moderate Assistance - Patient 50 - 74% Note: Goal downgraded based on progress.   Problem: RH Dressing Goal: LTG Patient will perform lower body dressing w/assist (OT) Description: LTG: Patient will perform lower body dressing with assist, with/without cues in positioning using equipment (OT) Flowsheets (Taken 01/29/2020 1243) LTG: Pt will perform lower body dressing with assistance level of: (Goal downgraded based on progress.) Moderate Assistance - Patient 50 - 74% Note: Goal downgraded based on progress.   Problem: RH Toileting Goal: LTG Patient will perform toileting task (3/3 steps) with assistance level (OT) Description: LTG: Patient will perform toileting task (3/3 steps) with assistance level (OT)  Flowsheets (Taken 01/29/2020 1243) LTG: Pt will perform toileting task (3/3 steps) with assistance level: (Goal downgraded based on progress.) Moderate Assistance - Patient 50 - 74% Note: Goal downgraded based on progress.

## 2020-01-29 NOTE — Progress Notes (Signed)
Physical Therapy Session Note  Patient Details  Name: Alexander Byrd MRN: [id number] Date of Birth: Jun 17, 1950  Today's Date: 01/29/2020 PT Individual Time: 1425-1505 PT Individual Time Calculation (min): 40 min   Short Term Goals: Week 3:  PT Short Term Goal 1 (Week 3): STG=LTG due to ELOS  Skilled Therapeutic Interventions/Progress Updates:    Patient in w/c with younger and older sister present in room upon PT arrival. Patient alert and agreeable to PT session. Patient denied pain throughout session. Sisters with no questions or concerns re: bed mobility or transfers.   Therapeutic Activity: Bed Mobility: Patient performed supine to/from sit with supervision and extra time to complete. VC provided for completing positioning to EOB. Pt demos ability to self position in bed toward [facility] with no physical assist and minimal vc provided for shoulder and hip positioning for improved body alignment. Transfers: Patient performed sit to/from stand throughout session w/c <> PFRW with Mod A for power up and Min/ mod A to complete to standing. Provided verbal cues for forward lean, equal push through both LE, and recognition of increased strength to pt's RUE and RLE.  NMR during Gait Training:  Patient ambulated 45 feet x2 using PFRW and Mod A for balance with younger sister providing w/c follow. Ambulation demonstrated in afternoon session with improved upright posture to midline initially - especially after providing instruction for improved technique prior to start. Technique breaks down with increased lean to L with fatigue. Improved L knee extension in weight bearing overall.  Provided verbal cues for upright posture, level gaze, midline weight bearing, widening BOS with increased L lateral foot placement with each step. In second bout of session, pt required additional presence of this PT's foot from behind and in between pt's feet for tactile cue to prevent midline stepping with L  foot.  Wheelchair Mobility:  Patient propelled wheelchair >200 feet with distant supervision and no vc provided. He demonstrates ability to sisters that he is able to reach distance and locate destination with no physical or verbal assist. Also able to maneuver tight spaces and correctly position in order to initiate squat and stand pivot transfers.   Neuromuscular Re-ed: Throughout session, vc and minimal tactile cues provided for improving upright posture, level gaze, increased use of R side of body, which in turn improves static standing balance as performed with each rise to stand, as well as dynamic standing balance demonstrated especially during mobility.   Pt and family educated that HEP training will occur this week with printed material provided.   Patient in bed at end of session with brakes locked, bed alarm set, and all needs within reach.    Therapy Documentation Precautions:  Precautions Precautions: Fall Precaution Comments: R>L hemi, R inattention. Poor postural control. Restrictions Weight Bearing Restrictions: No    Pain: Pain Assessment Pain Scale: 0-10 Pain Score: 0-No pain          :      Therapy/Group: Individual Therapy  Harolyn Rutherford, PT, DPT, CBIS  01/29/2020, 5:15 PM

## 2020-01-29 NOTE — Progress Notes (Signed)
Patient ID: Alexander Byrd, male   DOB: 01-Sep-1950, 70 y.o.   MRN: [id number]   Hospital bed, wheelchair, rolling walker and drop arm bsc ordered through [employer].   [place], [place] [phone]

## 2020-01-29 NOTE — Progress Notes (Signed)
Physical Therapy Session Note  Patient Details  Name: Alexander Byrd MRN: [id number] Date of Birth: January 14, 1951  Today's Date: 01/29/2020 PT Individual Time: 0908-0950 PT Individual Time Calculation (min): 42 min   Short Term Goals: Week 3:  PT Short Term Goal 1 (Week 3): STG=LTG due to ELOS  Skilled Therapeutic Interventions/Progress Updates:    Patient seated in w/c with NT finishing pt grooming upon PT arrival. Patient alert and agreeable to PT session. Patient denied pain throughout session.  Therapeutic Activity: Bed Mobility: Pt performed sit-->supine with supervision and no cues requiring extra time for LLE to clear EOB. Min A provided to pt for bed positioning toward EOB with vc for BLE and pt self positioning hands in order to perform.   Transfers: Patient performed sit to/from stand to PFRW with Mod A to initiate and Min A to complete to upright stance throughout session. Self positions BUE to platforms. StandpivoT performed w/c --> bed at end of session and split BUE positioning with RUE on bed rail and LUE pushing from w/c armrest.  Minimal vc provided for technique.  NMR: Patient ambulated using PFW for 23 feet with Mod A and 42 feet with Min A into Mod A with fatigue and breakdown in technique. Initial bout with PT support to L side to assist with L lean requiring max verbal cueing to correct lean, L knee flexion, and to widen BOS. Second bout with PT assist to R side and instructions provided prior to bout re: improving technique. First half of ambulation bout with max vc for widening BOS especially with L step. With fatigue, more cueing required for upright posture, improving L knee extension, widening L step placement, and generally trusting strength in R side for more support and improving tendency to lean to L. He was able to self correct with cueing for variable lengths of time. 2nd bout showed overall improvement from 1st bout.   NMR facilitated with standing balance  activity prior to all ambulation and transfers in attaining upright posture with no L sided lean. Cue for setting posture prior to any movement. VC provided to pt for self assessment and correction of posture.   Patient supine in bed at end of session with breaks locked, bed alarm set, and all needs within reach.   Therapy Documentation Precautions:  Precautions Precautions: Fall Precaution Comments: R>L hemi, R inattention. Poor postural control. Restrictions Weight Bearing Restrictions: No   Therapy/Group: Individual Therapy  Harolyn Rutherford, PT, DPT, CBIS  01/29/2020, 3:22 PM

## 2020-01-29 NOTE — Progress Notes (Signed)
Patient ID: Alexander Byrd, male   DOB: [birth year], 70 y.o.   MRN: [id number]  Home health options and MPOA booklet provided to patient sister. SW also provided information for family to contact [employer] in reference to medical equipment for discharge,  Lavera Guise, [place] [phone]

## 2020-01-29 NOTE — Progress Notes (Signed)
Speech Language Pathology Daily Session Note  Patient Details  Name: Alexander Byrd MRN: [id number] Date of Birth: [birth year]  Today's Date: 01/29/2020 SLP Individual Time: 2774-1287 SLP Individual Time Calculation (min): 27 min  Short Term Goals: Week 3: SLP Short Term Goal 1 (Week 3): STG=LTG due to remaining length of stay  Skilled Therapeutic Interventions: Pt was seen for skilled ST targeting education with pt and his 2 sisters. SLP facilitated session with review of pt's goals and progress in ST. Current diet recommendations and compensatory strategies reviewed, and handouts provided. SLP reviewed how to thick liquids to nectar consistency, however also discussed repeat MBSS scheduled for tomorrow with potential to change diet. SLP will make family aware of any potential changes to diet tomorrow, which they acknowledged. SLP also reviewed compensatory strategies to maximize pt's attention, short term recall, problem solving, and auditory processing. Explicit recommendations provided for 24/7 supervision and full assist with complex ADLs such as medication and finance management. Pt's family feels confident they can provide appropriate level of cognitive and physical assistance and all questions were answered to their satisfaction at this time. They were very receptive to all education and intuitive in terms of helpful cognitive-linguistic strategies to benefit pt. Pt left sitting in chair with alarm set and needs within reach. Continue per current plan of care.       Pain Pain Assessment Pain Scale: 0-10 Pain Score: 0-No pain  Therapy/Group: Individual Therapy  [name] 01/29/2020, 3:03 PM

## 2020-01-29 NOTE — Progress Notes (Signed)
[facility] PHYSICAL MEDICINE & REHABILITATION PROGRESS NOTE   Subjective/Complaints:  IV running pt thinks he can drink more, discussed need to be off IVF supplementation prior to discharge  ROS: Denies CP, SOB, N/V/D,?  Reliability due to cognition  Objective:   No results found. Recent Labs    01/28/20 0751  WBC 4.0  HGB 12.9*  HCT 38.8*  PLT 239   Recent Labs    01/28/20 0751  NA 141  K 3.9  CL 108  CO2 24  GLUCOSE 130*  BUN 7*  CREATININE 1.04  CALCIUM 9.5    Intake/Output Summary (Last 24 hours) at 01/29/2020 0753 Last data filed at 01/28/2020 2000 Gross per 24 hour  Intake 260 ml  Output 202 ml  Net 58 ml        Physical Exam: Vital Signs Blood pressure 125/76, pulse 85, temperature (!) 97.5 F (36.4 C), resp. rate 18, height 5\' 7"  (1.702 m), weight 69.2 kg, SpO2 96 %.   General: No acute distress Mood and affect are appropriate Heart: Regular rate and rhythm no rubs murmurs or extra sounds Lungs: Clear to auscultation, breathing unlabored, no rales or wheezes Abdomen: Positive bowel sounds, soft nontender to palpation, nondistended Extremities: No clubbing, cyanosis, or edema Skin: No evidence of breakdown, no evidence of rash Neurologic: Cranial nerves II through XII intact, motor strength is 5/5 in bilateral deltoid, bicep, tricep, grip, hip flexor, knee extensors, ankle dorsiflexor and plantar flexor Sensory exam normal sensation to light touch and proprioception in bilateral upper and lower extremities Cerebellar exam ataxia L>R finger to nose to finger Musculoskeletal: Full range of motion in all 4 extremities. No joint swelling  Psych: Blunted.  Flat.  Delayed.   Musc: No edema in extremities.  No tenderness in extremities. Neuro: Alert and oriented x1   Assessment/Plan: 1. Functional deficits which require 3+ hours per day of interdisciplinary therapy in a comprehensive inpatient rehab setting.  Physiatrist is providing close team  supervision and 24 hour management of active medical problems listed below.  Physiatrist and rehab team continue to assess barriers to discharge/monitor patient progress toward functional and medical goals  Care Tool:  Bathing    Body parts bathed by patient: Chest,Abdomen,Face,Right arm,Left arm,Right upper leg,Left upper leg,Right lower leg,Front perineal area,Left lower leg   Body parts bathed by helper: Buttocks Body parts n/a: Buttocks,Front perineal area (pt previously cleansed)   Bathing assist Assist Level: Moderate Assistance - Patient 50 - 74%     Upper Body Dressing/Undressing Upper body dressing   What is the patient wearing?: Pull over shirt    Upper body assist Assist Level: Supervision/Verbal cueing    Lower Body Dressing/Undressing Lower body dressing      What is the patient wearing?: Pants,Incontinence brief     Lower body assist Assist for lower body dressing: Moderate Assistance - Patient 50 - 74%     Toileting Toileting Toileting Activity did not occur (Clothing management and hygiene only): N/A (no void or bm)  Toileting assist Assist for toileting: Total Assistance - Patient < 25%     Transfers Chair/bed transfer  Transfers assist  Chair/bed transfer activity did not occur: Safety/medical concerns  Chair/bed transfer assist level: Moderate Assistance - Patient 50 - 74%     Locomotion Ambulation   Ambulation assist      Assist level: 2 helpers   Max distance: 31'   Walk 10 feet activity   Assist  Walk 10 feet activity did not occur: Safety/medical  concerns        Walk 50 feet activity   Assist Walk 50 feet with 2 turns activity did not occur: Safety/medical concerns         Walk 150 feet activity   Assist Walk 150 feet activity did not occur: Safety/medical concerns         Walk 10 feet on uneven surface  activity   Assist Walk 10 feet on uneven surfaces activity did not occur: Safety/medical  concerns         Wheelchair     Assist Will patient use wheelchair at discharge?: Yes Type of Wheelchair: Manual    Wheelchair assist level: Supervision/Verbal cueing Max wheelchair distance: 159ft    Wheelchair 50 feet with 2 turns activity    Assist        Assist Level: Supervision/Verbal cueing   Wheelchair 150 feet activity     Assist      Assist Level: Supervision/Verbal cueing   Blood pressure 125/76, pulse 85, temperature (!) 97.5 F (36.4 C), resp. rate 18, height 5\' 7"  (1.702 m), weight 69.2 kg, SpO2 96 %.  Medical Problem List and Plan: 1.  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  Continue CIR, team conference in am  2.  Antithrombotics: -DVT/anticoagulation: Lovenox             -antiplatelet therapy: Aspirin 81 mg daily and Plavix 75 mg daily x3 months then aspirin daily 3. Pain Management: Continue Tylenol as needed  RIght foot x-ray unremarkable  Controlled on 1/4 4. Mood: Provide emotional support             -antipsychotic agents: N/A 5. 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 Skin/Wound Care: Routine skin checks.  7. Fluids/Electrolytes/Nutrition: Routine and outs.  BMP normal 1/3 8.  Hypertension.  Continue Norvasc 5 mg daily.                   Vitals:   01/28/20 1930 01/29/20 0408  BP: 133/89 125/76  Pulse: 98 85  Resp: 18 18  Temp: 98.2 F (36.8 C) (!) 97.5 F (36.4 C)  SpO2: 95% 96%   Controlled on 1/4  9.  Hyperlipidemia: Lipitor 10.  BPH.  Continue Flomax 0.4 mg daily, increased on 1/2.               PVRs x1 showing retention with incontinence 11.  Severe sepsis with acute enteritis.  Completed course of azithromycin.  Monitor hydration. 12.  AKI: resolved.  13.  Post stroke dysphagia: D3 nectar, advance diet as tolerated-d/c IV and enc po  as discharge is <1wk 14. Nausea: continue zofran prn  15.  Constipation cont.  Senna S  Improving 16. Tachycardia:Mild  intermittent, continue to monitor. 17.  Mild/borderline normochromic normocytic anemia, asymptomatic  hgb 12.9 LOS: 20 days A FACE TO FACE EVALUATION WAS PERFORMED  03/28/20 01/29/2020, 7:53 AM

## 2020-01-30 ENCOUNTER — Inpatient Hospital Stay (HOSPITAL_COMMUNITY): Payer: Medicare HMO

## 2020-01-30 ENCOUNTER — Other Ambulatory Visit (HOSPITAL_COMMUNITY): Payer: Self-pay | Admitting: Physician Assistant

## 2020-01-30 ENCOUNTER — Encounter (HOSPITAL_COMMUNITY): Payer: Medicare HMO | Admitting: Occupational Therapy

## 2020-01-30 ENCOUNTER — Encounter (HOSPITAL_COMMUNITY): Payer: Medicare HMO | Admitting: Speech Pathology

## 2020-01-30 ENCOUNTER — Inpatient Hospital Stay (HOSPITAL_COMMUNITY): Payer: Medicare Other

## 2020-01-30 ENCOUNTER — Ambulatory Visit (HOSPITAL_COMMUNITY): Payer: Medicare HMO | Admitting: Physical Therapy

## 2020-01-30 MED ORDER — TAMSULOSIN HCL 0.4 MG PO CAPS: 0.4000 mg | ORAL_CAPSULE | Freq: Every day | ORAL | 0 refills | Status: DC

## 2020-01-30 MED ORDER — CROMOLYN SODIUM 4 % OP SOLN: 2.0000 [drp] | Freq: Four times a day (QID) | OPHTHALMIC | 12 refills | Status: DC

## 2020-01-30 MED ORDER — AMLODIPINE BESYLATE 5 MG PO TABS: 5.0000 mg | ORAL_TABLET | Freq: Every day | ORAL | 0 refills | Status: DC

## 2020-01-30 MED ORDER — ATORVASTATIN CALCIUM 80 MG PO TABS: 80.0000 mg | ORAL_TABLET | Freq: Every day | ORAL | 0 refills | Status: DC

## 2020-01-30 MED ORDER — CLOPIDOGREL BISULFATE 75 MG PO TABS: 75.0000 mg | ORAL_TABLET | Freq: Every day | ORAL | 2 refills | Status: DC

## 2020-01-30 MED FILL — ATORVASTATIN CALCIUM 80 MG: 80 | 30 days supply | Qty: 30 | Fill #0

## 2020-01-30 MED FILL — CLOPIDOGREL 75 MG TABLET: 75 | 30 days supply | Qty: 30 | Fill #0

## 2020-01-30 MED FILL — CROMOLYN 4% EYE DROPS: 4 | 15 days supply | Qty: 10 | Fill #0

## 2020-01-30 MED FILL — TAMSULOSIN HCL 0.4 MG CAP: 0.4 | 30 days supply | Qty: 30 | Fill #0

## 2020-01-30 MED FILL — AMLODIPINE BESYLATE 5 MG TA: 5 | 30 days supply | Qty: 30 | Fill #0

## 2020-01-30 NOTE — Progress Notes (Signed)
Occupational Therapy Session Note  Patient Details  Name: Alexander Byrd MRN: [id number] Date of Birth: 18-Nov-1950  Today's Date: 01/30/2020 OT Individual Time: 1301-1402 OT Individual Time Calculation (min): 61 min    Short Term Goals: Week 3:  OT Short Term Goal 1 (Week 3): STGs=LTGs set at [employer] A overall  Skilled Therapeutic Interventions/Progress Updates:    Pt in bed to start session agreeable to completion and showering for family education.  Pt's sister and other ex-spouse were present for session.  He completed squat pivot transfers to the wheelchair from the bed as well as to the tub bench during session.  His ex-spouse was able to assist with sit to stand during LB undressing as well as LB dressing tasks at min to mod assist.  He needed mod instructional cueing for sequencing bathing for thoroughness.  He transferred out of the shower after bathing with min assist squat pivot and worked on dressing at the sink.  Supervision for UB dressing and application of deodorant.  He then completed donning brief and pants with mod assist sit to stand and ex-spouse assisting.  He was able to donn his gripper socks to finish the session with setup as well, which was in improvement from yesterday's session, as he could not donn the sock on the RLE without assistance.  Finished session with pt in the wheelchair awaiting next session with PT.    Therapy Documentation Precautions:  Precautions Precautions: Fall Precaution Comments: R>L hemi, R inattention. Poor postural control. Restrictions Weight Bearing Restrictions: No  Pain: Pain Assessment Pain Scale: Faces Pain Score: 0-No pain ADL: See Care Tool Section for some details of mobility and selfcare  Therapy/Group: Individual Therapy  Laycee Fitzsimmons OTR/L 01/30/2020, 3:51 PM

## 2020-01-30 NOTE — Progress Notes (Signed)
Modified Barium Swallow Progress Note  Patient Details  Name: Alexander Byrd MRN: [id number] Date of Birth: 1950-10-24  Today's Date: 01/30/2020  Modified Barium Swallow completed.  Full report located under Chart Review in the Imaging Section.  Brief recommendations include the following:  Clinical Impression  Although mild oropharyngeal dysphagia still evident, pt demonstrated functional improvement in swallow function today in comparison to last MBSS conducted on 01/14/20. Pt still exhibits premature spillage of liquid boluses into the pharynx, with PO filling the pyriform sinuses for 2-5 seconds prior to swallow initiation. Aspiration X2 and penetration to the level of the vocal folds X1 noted during self-fed cup sips of thin barium (PAS scores of 4, 6, and 7). However, when boluses were administered via TSP, no penetration or aspiration was observed. Suspect this is due to smaller bolus size, as pt has difficulty regulating bolus size when drinking from a cup himself, despite cues. Pt did sense aspirates via a consistent throat clear or cough response, which was intermittently effective to clear aspirates/penetrates from vestibule after cup sips. Recommend pt upgrade to thin liquids, but he MUST CONSUME VIA TEASPOON OR PROVALE CUP ONLY to help control bolus size. SLP will introduce Provale cup during next available treatment session. Also recommend continue Dysphagia 3 (mechanical soft) solids and pills whole in applesauce, full supervision to ensure use of compensatory swallow strategies.   Swallow Evaluation Recommendations       SLP Diet Recommendations: Dysphagia 3 (Mech soft) solids;Thin liquid   Liquid Administration via: Spoon;Other (Comment) (Or provale cup (SLP will provide))   Medication Administration: Whole meds with puree   Supervision: Patient able to self feed;Full supervision/cueing for compensatory strategies   Compensations: Minimize environmental distractions;Slow  rate;Small sips/bites;Other (Comment) (must use SPOON OR PROVALE CUP for drinks)   Postural Changes: Remain semi-upright after after feeds/meals (Comment);Seated upright at 90 degrees   Oral Care Recommendations: Oral care BID        [name] 01/30/2020,9:53 AM

## 2020-01-30 NOTE — Patient Care Conference (Signed)
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 Claudette Laws Care Coodinator Present: Chana Bode, RN, BSN, CRRN;Christina Box, BSW Nurse Present: Adam Phenix, LPN PT Present: Harless Litten, PTA OT Present: Other (comment) Annye English, OT) SLP Present: Suzzette Righter, CF-SLP PPS Coordinator present : Edson Snowball, Park Breed, SLP     Current Status/Progress Goal Weekly Team Focus  Bowel/Bladder   pt cont of b and b lbm 01/29/19  remain cont  assess q shift and prn   Swallow/Nutrition/ Hydration   Dys 3 textures, thin liquids, full supervision, must use spoon or provale cup for liquid intake  Supervision  repeat MBS, carryover swallow strategies, tolerance current diet   ADL's   Supervision for UB bathing and dressing.  Min to mod for LB bathing and dressing sit to stand.  Min assist for squat pivot transfers to the drop arm commode with mod assist for stand pivot.  Still with increased pushing tendencies to the left in sitting, during transitional movements, as well as with standing.  overall min assist level with mod for tub transfers  selfcare retraining, transfer training, balance retraining, neuromuscular re-education, DME education   Mobility   supervision bed mobility, minA squat  pivot, modA stand pivot with PFRW, modA gait with PFRW, supervision w/c mobilty  min assist  gait, NMR, endurance, familly edu   Communication   dysphonia and mild dysarthria, processing time improving in a quiet controlled environment  Supervision  voice/intelligibility strategies   Safety/Cognition/ Behavioral Observations  Min-Mod  Supervision-Min  error awareness, basic familiar problem solving, attention, memory   Pain   pt has no c/o pain  remain pain free  assess q shift and prn   Skin   no current skin issues  remain free of skin break down  assess q shift and orn     Discharge Planning:  Discharging home with spouse, and sisters (lives next door). Ramp entrance. 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 Note left lateral propulsion with activity . Patient on target to meet rehab goals: yes, currently supervision for upper body and min-mod assist for lower body care. Min assist for transfers and min-mod assist for attention and memory  *See Care Plan and progress notes for long and short-term goals.   Revisions to Treatment Plan:  Downgraded goals to min assist   MBS 01/30/20 with upgrade of diet to thin liquids with spoon and / or provale' cup Teaching Needs: Medications, secondary risk management, transfers, toileting, etc.  Current Barriers to Discharge: Decreased caregiver support and Home enviroment access/layout  Possible Resolutions to Barriers: Family education completed with sister     Medical Summary Current Status: dysphagia improving , d/c IV  Barriers to Discharge: Nutrition means  Barriers to Discharge Comments: will d/c IV and make sure pt can maintain hydration via po route Possible Resolutions to Barriers/Weekly Focus: need to complete family ed, need to supervise size of sips, still has asp risk for larger boluses   Continued Need for Acute Rehabilitation Level of Care:  The patient requires daily medical management by a physician with specialized training in physical medicine and rehabilitation for the following reasons: Direction of a multidisciplinary physical rehabilitation program to maximize functional independence : Yes Medical management of patient stability for increased activity during participation in an intensive rehabilitation regime.: Yes Analysis of laboratory values and/or radiology reports with any subsequent need for medication adjustment and/or medical intervention. : Yes   I attest that I was present, lead the team conference, and concur with the assessment and plan of the team.   Chana Bode B 01/30/2020, 1:36 PM

## 2020-01-30 NOTE — Progress Notes (Signed)
Occupational Therapy Session Note  Patient Details  Name: Alexander Byrd MRN: [id number] Date of Birth: 01/14/1951  Today's Date: 01/30/2020 OT Individual Time: 3500-9381 OT Individual Time Calculation (min): 45 min    Short Term Goals: Week 3:  OT Short Term Goal 1 (Week 3): STGs=LTGs set at [employer] A overall  Skilled Therapeutic Interventions/Progress Updates:    Pt received supine, minimally verbal with OT but reporting no c/o pain and requesting to use the bathroom. Bed mobility to EOB with min A and good carryover of technique. Pt required mod cueing for proper positioning EOB to set up for transfer. Squat pivot transfer to the L with min A. Possible fluid leak from R brachial IV- nurse notified. Pt was taken via w/c into the bathroom and he completed a stand pivot transfer with mod A, requiring mod cueing for midline orientation d/t L lean. Pt required max A for toileting tasks overall with heavy L lean on the wall despite cueing. Pt sat for several minutes but had no void despite efforts. Pt stood with use of grab bar and completed peri hygiene with mod A. Pajama pants donned with mod A for threading distally over the RLE and for pulling up in standing. Min A stand pivot back to w/c. Pt completed grooming tasks and oral care at the sink with set up assist. Shirt changed and UB bathing with min A for pulling down posteriorly. Despite encouragement pt requested to return to bed. He completed stand pivot with min A. Pt left supine with all needs met, bed alarm set.   Therapy Documentation Precautions:  Precautions Precautions: Fall Precaution Comments: R>L hemi, R inattention. Poor postural control. Restrictions Weight Bearing Restrictions: No  Therapy/Group: Individual Therapy  Curtis Sites 01/30/2020, 6:47 AM

## 2020-01-30 NOTE — Progress Notes (Signed)
Speech Language Pathology Discharge Summary  Patient Details  Name: Alexander Byrd MRN: [id number] Date of Birth: [birth year]  Today's Date: 02/01/2020 SLP Individual Time: 1100-1156 SLP Individual Time Calculation (min): 56 min   Skilled Therapeutic Interventions:  Skilled ST services focused on education, swallow and cognitive skills. Pt did not initially recall use of Provale cup, but when SLP presented it to pt, pt demonstrated recall. SLP facilitated use of Provale cup when consuming thin liquids, pt demonstrated ability to self feed with no overt s/s aspiration. SLP educated pt on important use of Provale cup or thin liquids via TSP after reviewing most recent MBS with pt. Pt stated understanding. SLP provided handout on dys 3 texture diet and all questions were answered to satisfaction. Pt demonstrated recall of am OT events with supervision A verbal cues and recall of yesterday's events with supervision A verbal cues to utilize memory notebook. Pt was left with call bell within reach and bed alarm set.     Patient has met 8 of 8 long term goals.  Patient to discharge at overall Supervision;Min level.  Reasons goals not met: n/a   Clinical Impression/Discharge Summary:   Pt made functional gains and met 8 out of 8 long term goals this admission. Pt currently requires Supervision-Min assist for very basic familiar tasks due to cognitive impairments impacting his problem solving, emergent awareness, attention, and short term memory. He requires increased Mod A cueing for tasks that are more cognitively demanding/with increased complexity. Pt also with somewhat delayed auditory processing, which becomes more evident when there are distractions in the environment, however this is much improved since admission, in addition to his verbal and task initiation and communication of basic wants and needs. Nonetheless, pt will still require 24/7 supervision at discharge. Pt is consuming an upgraded  dysphagia 3 textures diet (mechanical soft) with thin liquids via teaspoon or Provale cup ONLY in order to control small bolus size (which was indicated as necessary after last MBSS 01/30/19). Pt has difficulty regulating small sips on his own, however uses other swallow strategies for safe swallowing and oral clearance with Supervision. He continues to present with dysphonia and mild dysarthria, however 95% intelligible at the sentence level now with Supervision A for use of compensatory strategies. However, given voice, speech, cognitive, and swallowing deficits still present, recommend pt continue to receive skilled ST services upon discharge. Pt and family education is complete at this time.    Care Partner:  Caregiver Able to Provide Assistance: Yes  Type of Caregiver Assistance: Cognitive;Physical  Recommendation:  Home Health SLP;24 hour supervision/assistance  Rationale for SLP Follow Up: Maximize functional communication;Maximize cognitive function and independence;Reduce caregiver burden;Maximize swallowing safety   Equipment: none   Reasons for discharge: Discharged from hospital   Patient/Family Agrees with Progress Made and Goals Achieved: Yes    [name] 01/31/2020, 7:26 AM

## 2020-01-30 NOTE — Progress Notes (Shared)
Physical Therapy Session Note  Patient Details  Name: Alexander Byrd MRN: [id number] Date of Birth: 1950/06/10  Today's Date: 01/30/2020 PT Individual Time: 1403-1510 PT Individual Time Calculation (min): 67 min   Short Term Goals: Week 3:  PT Short Term Goal 1 (Week 3): STG=LTG due to ELOS  Skilled Therapeutic Interventions/Progress Updates:    Patient seated up in w/c in room with family present upon PT arrival. Patient alert and agreeable to PT session. Patient denied pain during session.  Therapeutic Activity: Bed Mobility: Patient performed supine to/from sit with supervision. Family standing by to assist, however pt is able to perform transitions and bed positioning with no physical assist.  Provided verbal cues for foot placement and minor technique adjustments. Transfers: Patient performed sit to/from stand throughout session using PFRW with vc as reminder for split BUE support to promote push from w/c and anterior weight shift to improve balance during transfer to stand.   Neuromuscular Re-ed: NMR facilitated in several activities this session. Pt guided in sit<>stand x5 from EOB using no AD and only BUE on this PT's elbows for support. Focus on use of softer, more compliant surface as home environment as well as to increase muscle activation and coordination for improved technique and independence in transfers. Tactile cueing at glute max and hip abductors for improved hip extension and upright posture. Pt improving to light Min A to stand.  Patient ambulated 55 feet using PFRW with Min A initially and up to Mod A with fatigue. VC provided throughout for upright posture, increasing lateral L foot placement in foot strike, increasing R knee flexion during swing phase. Turn attempted into doorway to gym with pt narrowing BOS and increasing lean to L side. Second bout using colored targets on floor in order to improve pt's self foot placement to widen BOS and promote knee flexion prior  to foot strike. Minimal cueing initially and increasing in verbal and tactile cueing, as well as increase to Mod A for balance d/t fatigue. Prior to sitting, pt guided  in widening BOS, improving upright posture, increasing BLE knee extension and increasing weight bearing to R side. Pt is able to maintain upright stance to stand at PFRW with physical support for >20seconds.   Facilitated pt using BLE only in order to propel w/c as he will require use of BUE and BLE in order to propel w/c over carpet in home environment. Pt has only been using BUE for all propelling while here in IPPT. Difficulty demonstrated with LLE but he is able to provide momentum with simultaneous BLE use and then reciprocate steps.   Family education provided throughout on reasoning behind hand placements for increased muscle activation and facilitation for improved coordination during mobility.   Patient supine in bed at end of session with bed alarm set, and all needs within reach as well as family in room.   Therapy Documentation Precautions:  Precautions Precautions: Fall Precaution Comments: R>L hemi, R inattention. Poor postural control. Restrictions Weight Bearing Restrictions: No General:   Vital Signs: Therapy Vitals Temp: 98.1 F (36.7 C) Pulse Rate: 91 Resp: 18 BP: 125/78 Patient Position (if appropriate): Lying Oxygen Therapy SpO2: 97 % O2 Device: Room Air    Therapy/Group: Individual Therapy  Loel Dubonnet 01/30/2020, 3:40 PM

## 2020-01-30 NOTE — Progress Notes (Signed)
[place] [facility] PHYSICAL MEDICINE & REHABILITATION PROGRESS NOTE   Subjective/Complaints:  Going for MBS this am , upgraded to thin liquids  ROS: Denies CP, SOB, N/V/D,Objective:   No results found. Recent Labs    01/28/20 0751  WBC 4.0  HGB 12.9*  HCT 38.8*  PLT 239   Recent Labs    01/28/20 0751  NA 141  K 3.9  CL 108  CO2 24  GLUCOSE 130*  BUN 7*  CREATININE 1.04  CALCIUM 9.5    Intake/Output Summary (Last 24 hours) at 01/30/2020 0850 Last data filed at 01/29/2020 1756 Gross per 24 hour  Intake 218 ml  Output 200 ml  Net 18 ml        Physical Exam: Vital Signs Blood pressure 110/71, pulse 87, temperature 98.4 F (36.9 C), temperature source Oral, resp. rate 20, height _0  (1.702 m), weight 69.2 kg, SpO2 94 %.    General: No acute distress Mood and affect are appropriate Heart: Regular rate and rhythm no rubs murmurs or extra sounds Lungs: Clear to auscultation, breathing unlabored, no rales or wheezes Abdomen: Positive bowel sounds, soft nontender to palpation, nondistended Extremities: No clubbing, cyanosis, or edema Skin: No evidence of breakdown, no evidence of rash    Sensory exam normal sensation to light touch and proprioception in bilateral upper and lower extremities Cerebellar exam ataxia L>R finger to nose to finger Musculoskeletal: Full range of motion in all 4 extremities. No joint swelling  Psych: Blunted.  Flat.  Delayed.   Musc: No edema in extremities.  No tenderness in extremities. Neuro: Alert and oriented x1   Assessment/Plan: 1. Functional deficits which require 3+ hours per day of interdisciplinary therapy in a comprehensive inpatient rehab setting.  Physiatrist is providing close team supervision and 24 hour management of active medical problems listed below.  Physiatrist and rehab team continue to assess barriers to discharge/monitor patient progress toward functional and medical goals  Care Tool:  Bathing    Body parts  bathed by patient: Chest,Abdomen,Face,Right arm,Left arm,Right upper leg,Left upper leg,Right lower leg,Left lower leg   Body parts bathed by helper: Buttocks,Front perineal area Body parts n/a: Buttocks,Front perineal area (pt previously cleansed)   Bathing assist Assist Level: Moderate Assistance - Patient 50 - 74%     Upper Body Dressing/Undressing Upper body dressing   What is the patient wearing?: Pull over shirt    Upper body assist Assist Level: Supervision/Verbal cueing    Lower Body Dressing/Undressing Lower body dressing      What is the patient wearing?: Pants,Incontinence brief     Lower body assist Assist for lower body dressing: Moderate Assistance - Patient 50 - 74%     Toileting Toileting Toileting Activity did not occur (Clothing management and hygiene only): N/A (no void or bm)  Toileting assist Assist for toileting: Moderate Assistance - Patient 50 - 74%     Transfers Chair/bed transfer  Transfers assist  Chair/bed transfer activity did not occur: Safety/medical concerns  Chair/bed transfer assist level: Minimal Assistance - Patient > 75%     Locomotion Ambulation   Ambulation assist      Assist level: Moderate Assistance - Patient 50 - 74% Assistive device: Walker-platform Max distance: 45'   Walk 10 feet activity   Assist  Walk 10 feet activity did not occur: Safety/medical concerns  Assist level: Moderate Assistance - Patient - 50 - 74% Assistive device: Walker-platform   Walk 50 feet activity   Assist Walk 50 feet with 2  turns activity did not occur: Safety/medical concerns         Walk 150 feet activity   Assist Walk 150 feet activity did not occur: Safety/medical concerns         Walk 10 feet on uneven surface  activity   Assist Walk 10 feet on uneven surfaces activity did not occur: Safety/medical concerns         Wheelchair     Assist Will patient use wheelchair at discharge?: Yes Type of  Wheelchair: Manual    Wheelchair assist level: Set up assist Max wheelchair distance: >200'    Wheelchair 50 feet with 2 turns activity    Assist        Assist Level: Supervision/Verbal cueing   Wheelchair 150 feet activity     Assist      Assist Level: Supervision/Verbal cueing   Blood pressure 110/71, pulse 87, temperature 98.4 F (36.9 C), temperature source Oral, resp. rate 20, height _0  (1.702 m), weight 69.2 kg, SpO2 94 %.  Medical Problem List and Plan: 1.  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  Continue CIR,Team conference today please see physician documentation under team conference tab, met with team  to discuss problems,progress, and goals. Formulized individual treatment plan based on medical history, underlying problem and comorbidities.  2.  Antithrombotics: -DVT/anticoagulation: Lovenox             -antiplatelet therapy: Aspirin 81 mg daily and Plavix 75 mg daily x3 months then aspirin daily 3. Pain Management: Continue Tylenol as needed  RIght foot x-ray unremarkable  Controlled on 1/4 4. Mood: Provide emotional support             -antipsychotic agents: N/A 5. 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 Skin/Wound Care: Routine skin checks.  7. Fluids/Electrolytes/Nutrition: Routine and outs.  BMP normal 1/3 8.  Hypertension.  Continue Norvasc 5 mg daily.                   Vitals:   01/29/20 1947 01/30/20 0448  BP: 139/87 110/71  Pulse: 87 87  Resp: 20 20  Temp: 98.3 F (36.8 C) 98.4 F (36.9 C)  SpO2: 97% 94%   Controlled on 1/5  9.  Hyperlipidemia: Lipitor 10.  BPH.  Continue Flomax 0.4 mg daily, increased on 1/2.               PVRs x1 showing retention with incontinence 11.  Severe sepsis with acute enteritis.  Completed course of azithromycin.  Monitor  hydration. 12.  AKI: resolved.  13.  Post stroke dysphagia: D3 nectar, advance diet as tolerated-d/c IV and enc po as discharge is <1wk 14. Nausea: continue zofran prn  15.  Constipation cont.  Senna S  Improving 16. Tachycardia:Mild  intermittent, continue to monitor. 17.  Mild/borderline normochromic normocytic anemia, asymptomatic  hgb 12.9 LOS: 21 days A FACE TO FACE EVALUATION WAS PERFORMED  Charlett Blake 01/30/2020, 8:50 AM

## 2020-01-31 ENCOUNTER — Inpatient Hospital Stay (HOSPITAL_COMMUNITY): Payer: Medicare HMO | Admitting: Speech Pathology

## 2020-01-31 ENCOUNTER — Inpatient Hospital Stay (HOSPITAL_COMMUNITY): Payer: Medicare HMO | Admitting: Physical Therapy

## 2020-01-31 ENCOUNTER — Inpatient Hospital Stay (HOSPITAL_COMMUNITY): Payer: Medicare HMO | Admitting: Occupational Therapy

## 2020-01-31 NOTE — Discharge Summary (Signed)
Physician Discharge Summary  Patient ID: Alexander Byrd MRN: [id number] DOB/AGE: 71-Aug-1952 71 y.o.  Admit date: 01/09/2020 Discharge date: 02/02/2020  Discharge Diagnoses:  Principal Problem:   Wallenberg syndrome Active Problems:   Acute blood loss anemia   Slow transit constipation   Dysphagia, post-stroke   Chronic pain in right foot Hypertension Hyperlipidemia BPH Sepsis with acute enteritis AKI/resolved Post dysphagia  Discharged Condition: Stable  Significant Diagnostic Studies: EEG  Result Date: 01/02/2020 Charlsie Quest, MD     01/02/2020 12:08 PM Patient Name: Alexander Byrd MRN: [id number] Epilepsy Attending: Charlsie Quest Referring Physician/Provider: Dr Erick Blinks Date: 01/02/2020 Duration: 25.27 mins Patient history: 70 y.o. 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 He was noted to have right eye deviation with twitching eye movements concerning for a possible seizure. EEG to evaluate for seizure Level of alertness: Awake, asleep AEDs during EEG study: LEV Technical aspects: This EEG study was done with scalp electrodes positioned according to the 10-20 International system of electrode placement. Electrical activity was acquired at a sampling rate of 500Hz  and reviewed with a high frequency filter of 70Hz  and a low frequency filter of 1Hz . EEG data were recorded continuously and digitally stored. Description: The posterior dominant rhythm consists of 8 Hz activity of moderate voltage (25-35 uV) seen predominantly in posterior head regions, symmetric and reactive to eye opening and eye closing. Sleep was characterized by vertex waves, sleep spindles (12 to 14 Hz), maximal frontocentral region.  EEG showed intermittent generalized 3 to 6 Hz theta-delta slowing.  Hyperventilation and photic stimulation were not performed.   ABNORMALITY -Intermittent slow, generalized IMPRESSION: This study is suggestive of mild diffuse encephalopathy, nonspecific etiology. No seizures or epileptiform discharges were seen throughout the recording. Priyanka   CT ANGIO HEAD W OR WO CONTRAST  Result Date: 01/03/2020 CLINICAL DATA:  Initial evaluation for acute ataxia, follow-up exam for stroke. EXAM: CT ANGIOGRAPHY HEAD AND NECK TECHNIQUE: Multidetector CT imaging of the head and neck was performed using the standard protocol during bolus administration of intravenous contrast. Multiplanar CT image reconstructions and MIPs were obtained to evaluate the vascular anatomy. Carotid stenosis measurements (when applicable) are obtained utilizing NASCET criteria, using the distal internal carotid diameter as the denominator. CONTRAST:  84mL OMNIPAQUE IOHEXOL 350 MG/ML SOLN COMPARISON:  Prior brain MRI from 01/02/2020. FINDINGS: CT HEAD FINDINGS Brain: Age-related cerebral atrophy with moderate chronic microvascular ischemic disease. Remote lacunar infarcts noted at the left basal ganglia/corona radiata. Evolving cytotoxic edema involving the inferior left cerebellum consistent with previously identified left PICA territory infarcts. Additional previously described ischemic change about the left middle cerebellar peduncle and medulla not well visualized by CT. No evidence for hemorrhagic transformation or significant regional mass effect. No other acute large vessel territory infarct. No mass lesion, midline shift or mass effect. No hydrocephalus or extra-axial fluid collection. Vascular: No hyperdense vessel. Scattered vascular calcifications noted within the carotid siphons. Skull: Scalp soft tissues and calvarium within normal limits. Sinuses: Paranasal sinuses and mastoid air cells are clear. Orbits: Globes and orbital soft tissues within normal limits. CTA NECK FINDINGS Aortic arch:  Visualized aortic arch of normal caliber with normal 3 vessel morphology. No hemodynamically significant stenosis about the origin of the great vessels. Right carotid system: Right common and internal carotid arteries widely patent without stenosis, dissection or occlusion.  Left carotid system: Left common and internal carotid arteries widely patent without stenosis, dissection or occlusion. Vertebral arteries: Both vertebral arteries arise from the subclavian arteries. No proximal subclavian artery stenosis. Vertebral arteries are diffusely hypoplastic bilaterally but remain patent within the neck without stenosis, dissection or occlusion. Skeleton: No acute osseous finding. No discrete or worrisome osseous lesions. Moderate cervical spondylosis noted at C5-6 and C6-7. Poor dentition noted. Other neck: No other acute soft tissue abnormality within the neck. No mass or adenopathy. Upper chest: Visualized upper chest demonstrates no acute finding. Review of the MIP images confirms the above findings CTA HEAD FINDINGS Anterior circulation: Petrous and cavernous segments patent bilaterally. Atheromatous irregularity within the supraclinoid ICAs without hemodynamically significant stenosis. A1 segments patent bilaterally. Normal anterior communicating artery complex. Anterior cerebral arteries patent to their distal aspects without high-grade stenosis. Left M1 segment widely patent. Short-segment moderate stenosis noted at the mid right M1 segment (series 15, image 64). Normal left MCA bifurcation. Right MCA trifurcations. Moderate atheromatous irregularity throughout the MCA branches distally. No proximal M2 branch occlusion. Posterior circulation: Right vertebral artery patent as it courses into the cranial vault. Right PICA origin patent and perfused. Right V4 segment essentially occludes just beyond the takeoff of the right PICA. Left vertebral artery essentially occludes at the cranial vault, with only scant  thready flow seen distally. Left PICA not visualized, likely occluded. Basilar artery is diffusely diminutive with markedly irregular and scant thready flow. Perfusion of the basilar tip via collateralization from the anterior circulation. Superior cerebral arteries are perfused bilaterally. Predominant fetal type origin of the PCAs. PCAs are irregular but remain patent to their distal aspects without high-grade stenosis. Venous sinuses: Patent allowing for timing of the contrast bolus. Anatomic variants: Predominant fetal type origin of the PCAs with overall diminutive vertebrobasilar system. Review of the MIP images confirms the above findings IMPRESSION: CT HEAD IMPRESSION: 1. Continued interval evolution of scattered left PICA and posterior fossa infarcts, grossly stable from previous MRI. No evidence for hemorrhagic transformation or significant regional mass effect. 2. No other new acute intracranial abnormality. 3. Underlying atrophy with chronic small vessel ischemic disease. CTA HEAD AND NECK IMPRESSION: 1. 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 The left PICA is not visualized, likely occluded. Both PCAs and SCAs remain perfused via the anterior circulation. 2. Short-segment moderate stenosis involving the mid right M1 segment. 3. Wide patency of the major arterial vasculature in the neck. Results were discussed by telephone at the time of interpretation on 01/03/2020 at 4:40 am with provider [facility]. Electronically Signed   By: Rise Mu M.D.   On: 01/03/2020 04:49   CT ANGIO NECK W OR WO CONTRAST  Result Date: 01/03/2020 CLINICAL DATA:  Initial evaluation for acute ataxia, follow-up exam for stroke. EXAM: CT ANGIOGRAPHY HEAD AND NECK TECHNIQUE:  Multidetector CT imaging of the head and neck was performed using the standard protocol during bolus administration of intravenous contrast. Multiplanar CT image reconstructions and MIPs were obtained to evaluate the vascular anatomy. Carotid stenosis measurements (when applicable) are obtained utilizing NASCET criteria, using the distal internal carotid diameter as the denominator. CONTRAST:  75mL OMNIPAQUE IOHEXOL 350 MG/ML SOLN COMPARISON:  Prior brain MRI from 01/02/2020. FINDINGS: CT HEAD FINDINGS Brain: Age-related cerebral atrophy with moderate chronic microvascular ischemic disease. Remote lacunar  infarcts noted at the left basal ganglia/corona radiata. Evolving cytotoxic edema involving the inferior left cerebellum consistent with previously identified left PICA territory infarcts. Additional previously described ischemic change about the left middle cerebellar peduncle and medulla not well visualized by CT. No evidence for hemorrhagic transformation or significant regional mass effect. No other acute large vessel territory infarct. No mass lesion, midline shift or mass effect. No hydrocephalus or extra-axial fluid collection. Vascular: No hyperdense vessel. Scattered vascular calcifications noted within the carotid siphons. Skull: Scalp soft tissues and calvarium within normal limits. Sinuses: Paranasal sinuses and mastoid air cells are clear. Orbits: Globes and orbital soft tissues within normal limits. CTA NECK FINDINGS Aortic arch: Visualized aortic arch of normal caliber with normal 3 vessel morphology. No hemodynamically significant stenosis about the origin of the great vessels. Right carotid system: Right common and internal carotid arteries widely patent without stenosis, dissection or occlusion. Left carotid system: Left common and internal carotid arteries widely patent without stenosis, dissection or occlusion. Vertebral arteries: Both vertebral arteries arise from the subclavian arteries. No  proximal subclavian artery stenosis. Vertebral arteries are diffusely hypoplastic bilaterally but remain patent within the neck without stenosis, dissection or occlusion. Skeleton: No acute osseous finding. No discrete or worrisome osseous lesions. Moderate cervical spondylosis noted at C5-6 and C6-7. Poor dentition noted. Other neck: No other acute soft tissue abnormality within the neck. No mass or adenopathy. Upper chest: Visualized upper chest demonstrates no acute finding. Review of the MIP images confirms the above findings CTA HEAD FINDINGS Anterior circulation: Petrous and cavernous segments patent bilaterally. Atheromatous irregularity within the supraclinoid ICAs without hemodynamically significant stenosis. A1 segments patent bilaterally. Normal anterior communicating artery complex. Anterior cerebral arteries patent to their distal aspects without high-grade stenosis. Left M1 segment widely patent. Short-segment moderate stenosis noted at the mid right M1 segment (series 15, image 64). Normal left MCA bifurcation. Right MCA trifurcations. Moderate atheromatous irregularity throughout the MCA branches distally. No proximal M2 branch occlusion. Posterior circulation: Right vertebral artery patent as it courses into the cranial vault. Right PICA origin patent and perfused. Right V4 segment essentially occludes just beyond the takeoff of the right PICA. Left vertebral artery essentially occludes at the cranial vault, with only scant thready flow seen distally. Left PICA not visualized, likely occluded. Basilar artery is diffusely diminutive with markedly irregular and scant thready flow. Perfusion of the basilar tip via collateralization from the anterior circulation. Superior cerebral arteries are perfused bilaterally. Predominant fetal type origin of the PCAs. PCAs are irregular but remain patent to their distal aspects without high-grade stenosis. Venous sinuses: Patent allowing for timing of the contrast  bolus. Anatomic variants: Predominant fetal type origin of the PCAs with overall diminutive vertebrobasilar system. Review of the MIP images confirms the above findings IMPRESSION: CT HEAD IMPRESSION: 1. Continued interval evolution of scattered left PICA and posterior fossa infarcts, grossly stable from previous MRI. No evidence for hemorrhagic transformation or significant regional mass effect. 2. No other new acute intracranial abnormality. 3. Underlying atrophy with chronic small vessel ischemic disease. CTA HEAD AND NECK IMPRESSION: 1. 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 The left PICA is not visualized, likely occluded. Both PCAs and SCAs remain perfused via the anterior circulation. 2. Short-segment moderate stenosis involving the mid right M1 segment. 3. Wide patency of  the major arterial vasculature in the neck. Results were discussed by telephone at the time of interpretation on 01/03/2020 at 4:40 am with provider [facility]. Electronically Signed   By: Rise Mu M.D.   On: 01/03/2020 04:49   MR BRAIN W WO CONTRAST  Addendum Date: 01/03/2020   ADDENDUM REPORT: 01/03/2020 06:12 ADDENDUM: Results communicated by telephone to nurse practitioner Webb Silversmith at 1:20 a.m. on 01/02/2020. Electronically Signed   By: Rise Mu M.D.   On: 01/03/2020 06:12   Result Date: 01/03/2020 CLINICAL DATA:  Initial evaluation for bacterial meningitis. Evaluate for resolution of previously seen subtle diffusion abnormality from prior exam. EXAM: MRI HEAD WITHOUT AND WITH CONTRAST TECHNIQUE: Multiplanar, multiecho pulse sequences of the brain and surrounding structures were obtained without and with intravenous contrast. CONTRAST:  7mL GADAVIST GADOBUTROL 1  MMOL/ML IV SOLN COMPARISON:  Prior MRI from 01/01/2020. FINDINGS: Brain: Examination mildly degraded by motion artifact, particularly the postcontrast sequences. Generalized age-related cerebral atrophy. Patchy and confluent T2/FLAIR hyperintensity within the periventricular and deep white matter both cerebral hemispheres most consistent with chronic small vessel ischemic disease, moderate in nature. Few small remote lacunar infarcts noted at the left basal ganglia/corona radiata. Previously identified diffusion abnormality involving the left cerebellum again seen, markedly more intense on today's exam as compared to previous (series 6, image 11). Scattered patchy involvement of the left middle cerebellar peduncle and ventral medulla (series 6, images 15, 13). Associated mildly decreased signal on corresponding ADC map, with prominent T2/FLAIR hyperintensity within the areas affected. Following contrast administration, there is patchy and somewhat linear postcontrast enhancement throughout the areas involved (series 22, images 8, 9). Given appearance and distribution, findings felt to be most consistent with acute to subacute ischemic infarcts, predominantly left PICA territory. Suspected faint petechial hemorrhage at the inferior left cerebellum without hemorrhagic transformation (series 13, image 14). No other new diffusion abnormality to suggest acute or subacute ischemia seen elsewhere within the brain. Gray-white matter differentiation otherwise maintained. No other areas of chronic cortical infarction. No other foci of susceptibility artifact to suggest acute or chronic intracranial hemorrhage. No mass lesion, midline shift or mass effect. No hydrocephalus or extra-axial fluid collection. Pituitary gland and suprasellar region within normal limits. Midline structures intact. No other abnormal enhancement to suggest meningitis or other intracranial infection. No intrinsic temporal lobe abnormality. Vascular:  Intracranial flow voids within the bilateral V4 segments and basilar artery are not well seen, and could be partially occluded given the posterior circulation infarcts (series 10, images 5, 8). There is suspected predominant fetal type origin of the PCAs. Major intracranial vascular flow voids are otherwise maintained. Skull and upper cervical spine: Craniocervical junction within normal limits. Bone marrow signal intensity normal. No scalp soft tissue abnormality. Sinuses/Orbits: Globes and orbital soft tissues within normal limits. Mild mucosal thickening noted within the ethmoidal air cells and maxillary sinuses. Paranasal sinuses are otherwise clear. No mastoid effusion. Inner ear structures grossly normal. Other: None. IMPRESSION: 1. Persistent and increased prominence of previously identified left cerebellar diffusion abnormality, with additional patchy involvement of the left middle cerebellar peduncle and medulla. Given appearance and distribution on today's exam, findings felt to be most consistent with acute to subacute ischemic infarcts, predominantly left PICA territory. Suspected faint petechial hemorrhage at the inferior left cerebellum without hemorrhagic transformation. 2. Poorly visualized and potentially absent flow voids within the distal V4 segments and basilar artery, which could be occluded given the posterior circulation infarcts. Correlation with dedicated CTA suggested. 3. No other  acute intracranial abnormality. No other abnormal enhancement to suggest meningitis or other intracranial infection. No other findings of acute seizure. 4. Underlying age-related cerebral atrophy with moderate chronic microvascular ischemic disease. Current attempt is being made to contact the covering physician regarding these findings, results will be communicated as soon as possible. Electronically Signed: By: Rise Mu M.D. On: 01/03/2020 00:41   DG Foot Complete Right  Result Date:  01/22/2020 CLINICAL DATA:  Chronic right foot pain. EXAM: RIGHT FOOT COMPLETE - 3+ VIEW COMPARISON:  No prior. FINDINGS: No acute bony or joint abnormality identified. No evidence of fracture or dislocation. IMPRESSION: No acute abnormality. Electronically Signed   By: Maisie Fus  Register   On: 01/22/2020 10:23   DG Swallowing Func-Speech Pathology  Result Date: 01/30/2020 Objective Swallowing Evaluation: Type of Study: MBS-Modified Barium Swallow Study  Patient Details Name: Alexander Byrd MRN: [id number] Date of Birth: 29-Jan-1950 Today's Date: 01/30/2020 Past Medical History: No past medical history on file. Past Surgical History: No past surgical history on file. HPI: Pt is a 70 y.o. 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 More confusion per family report as well. Patient is altered on arrival and unable to write any verbal history.  He is accompanied by his sister who states that typically his able to speak and only has weakness in his left side.  Unsure of pt's Baseline Cognitive-communication status or functional status for safe po intake, self-feeding as there were no notes in the chart in Care Everywhere from his recent CVA at "[facility]" ~3 weeks ago. Pt admitted to [facility] CIR 01/09/20.  Assessment / Plan / Recommendation CHL IP CLINICAL IMPRESSIONS 01/30/2020 Clinical Impression Although mild oropharyngeal dysphagia still evident, pt demonstrated functional improvement in swallow function today in comparison to last MBSS conducted on 01/14/20. Pt still exhibits premature spillage of liquid boluses into the pharynx, with PO filling the pyriform sinuses for 2-5 seconds prior to swallow initiation. Aspiration X2 and penetration to the level of the vocal folds X1 noted during self-fed  cup sips of thin barium (PAS scores of 4, 6, and 7). However, when boluses were administered via TSP, no penetration or aspiration was observed. Suspect this is due to smaller bolus size, as pt has difficulty regulating bolus size when drinking from a cup himself, despite cues. Pt did sense aspirates via a consistent throat clear or cough response, which was intermittently effective to clear aspirates/penetrates from vestibule after cup sips. Recommend pt upgrade to thin liquids, but he MUST CONSUME VIA TEASPOON OR PROVALE CUP ONLY to help control bolus size. SLP will introduce Provale cup during next available treatment session. Also recommend continue Dysphagia 3 (mechanical soft) solids and pills whole in applesauce, full supervision to ensure use of compensatory swallow strategies. SLP Visit Diagnosis Dysphagia, oropharyngeal phase (R13.12) Attention and concentration deficit following -- Frontal lobe and executive function deficit following -- Impact on safety and function Mild aspiration risk   CHL IP TREATMENT RECOMMENDATION 01/05/2020 Treatment Recommendations Therapy as outlined in treatment plan below   Prognosis 01/05/2020 Prognosis for Safe Diet Advancement Fair Barriers to Reach Goals Cognitive deficits;Time post onset;Severity of deficits;Language deficits;Behavior Barriers/Prognosis Comment -- CHL IP DIET RECOMMENDATION 01/30/2020 SLP Diet Recommendations Dysphagia 3 (Mech soft) solids;Thin liquid Liquid Administration via Spoon;Other (Comment)  Medication Administration Whole meds with puree Compensations Minimize environmental distractions;Slow rate;Small sips/bites;Other (Comment) Postural Changes Remain semi-upright after after feeds/meals (Comment);Seated upright at 90 degrees   CHL IP OTHER RECOMMENDATIONS 01/30/2020 Recommended Consults -- Oral Care Recommendations Oral care BID Other Recommendations --   CHL IP FOLLOW UP RECOMMENDATIONS 01/05/2020 Follow up Recommendations Skilled Nursing facility    [facility] IP FREQUENCY AND DURATION 01/05/2020 Speech Therapy Frequency (ACUTE ONLY) min 3x week Treatment Duration 2 weeks      CHL IP ORAL PHASE 01/30/2020 Oral Phase Impaired Oral - Pudding Teaspoon -- Oral - Pudding Cup -- Oral - Honey Teaspoon -- Oral - Honey Cup NT Oral - Nectar Teaspoon NT Oral - Nectar Cup Decreased bolus cohesion;Premature spillage Oral - Nectar Straw -- Oral - Thin Teaspoon Decreased bolus cohesion;Premature spillage Oral - Thin Cup Decreased bolus cohesion;Premature spillage Oral - Thin Straw Decreased bolus cohesion;Premature spillage Oral - Puree WFL Oral - Mech Soft -- Oral - Regular -- Oral - Multi-Consistency -- Oral - Pill NT Oral Phase - Comment --  CHL IP PHARYNGEAL PHASE 01/30/2020 Pharyngeal Phase Impaired Pharyngeal- Pudding Teaspoon -- Pharyngeal -- Pharyngeal- Pudding Cup -- Pharyngeal -- Pharyngeal- Honey Teaspoon -- Pharyngeal -- Pharyngeal- Honey Cup NT Pharyngeal -- Pharyngeal- Nectar Teaspoon NT Pharyngeal -- Pharyngeal- Nectar Cup Delayed swallow initiation-pyriform sinuses Pharyngeal Material does not enter airway Pharyngeal- Nectar Straw -- Pharyngeal -- Pharyngeal- Thin Teaspoon Delayed swallow initiation-pyriform sinuses;Penetration/Aspiration during swallow Pharyngeal Material does not enter airway Pharyngeal- Thin Cup Delayed swallow initiation-pyriform sinuses;Penetration/Aspiration during swallow Pharyngeal Material enters airway, passes BELOW cords then ejected out;Material enters airway, CONTACTS cords and then ejected out Pharyngeal- Thin Straw Delayed swallow initiation-pyriform sinuses;Penetration/Aspiration during swallow Pharyngeal Material enters airway, passes BELOW cords and not ejected out despite cough attempt by patient Pharyngeal- Puree WFL Pharyngeal -- Pharyngeal- Mechanical Soft -- Pharyngeal -- Pharyngeal- Regular -- Pharyngeal -- Pharyngeal- Multi-consistency -- Pharyngeal -- Pharyngeal- Pill NT Pharyngeal -- Pharyngeal Comment --  CHL IP CERVICAL  ESOPHAGEAL PHASE 01/30/2020 Cervical Esophageal Phase WFL Pudding Teaspoon -- Pudding Cup -- Honey Teaspoon -- Honey Cup -- Nectar Teaspoon -- Nectar Cup -- Nectar Straw -- Thin Teaspoon -- Thin Cup -- Thin Straw -- Puree -- Mechanical Soft -- Regular -- Multi-consistency -- Pill -- Cervical Esophageal Comment -- Alexander Byrd 01/30/2020, 10:56 AM              DG Swallowing Func-Speech Pathology  Result Date: 01/14/2020 Objective Swallowing Evaluation: Type of Study: MBS-Modified Barium Swallow Study  Patient Details Name: Alexander Byrd MRN: [id number] Date of Birth: 08-05-1950 Today's Date: 01/14/2020 Past Medical History: No past medical history on file. Past Surgical History: No past surgical history on file. HPI: Pt is a 70 y.o. 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 More confusion per family report as well. Patient is altered on arrival and unable to write any verbal history.  He is accompanied by his sister who states that typically his able to speak and only has weakness in his left side.  Unsure of pt's Baseline Cognitive-communication status or functional status for safe po intake, self-feeding as there were no notes in the chart in Care Everywhere from his recent CVA at "[facility]" ~3 weeks ago. Pt admitted to [facility] 01/09/20.  Assessment /  Plan / Recommendation CHL IP CLINICAL IMPRESSIONS 01/14/2020 Clinical Impression --Pt presents with mild oropharyngeal dysphagia characterized by oral and facial weakness and decreased control of liquid boluses leading to premature spillage into the pharynx and intermittent deep penetration. Puree and honey thick barium resulted in lingual pumping and mildly delayed oral transit with swallow initiation occurring at the vallecular sinuses.  Self-fed cup sips of nectar resulted in premature spillage into the pyriform sinuses, where it remained for 2-4 seconds prior to swallow initiation and resulted in penetration to the vocal folds followed by questionable trace aspiration X1 (PAS score 5 vs 6). When nectar boluses were delivered to pt via tsp, swallow initiation occurred in a more timely manner in >75% of instances (at the level of the vallecula) in the absence of airway intrusion. Thin barium trials resulted in a similar presentation to nectar via cup - a significantly delayed swallow initiation with penetration into the vestibule (PAS score 5). Pt did demonstrate ability to consume a barium pill whole in applesauce with only a Alexander extra time for oral transit. Recommend pt continue with Dysphagia 2 (minced/ground) solid textures, upgrade to nectar thick liquids by tsp only, pills may be given whole in applesauce 1 at a time. Pt will still required full supervision during intake to ensure use of safe and compensatory swallow strategies. ST will continue to provide skilled interventions to help pt work toward diet advancement and increased independence with use of compensatory swallow strategies while he remains inpatient.  SLP Visit Diagnosis Dysphagia, oropharyngeal phase (R13.12) Attention and concentration deficit following -- Frontal lobe and executive function deficit following -- Impact on safety and function Mild aspiration risk   CHL IP TREATMENT RECOMMENDATION 01/05/2020 Treatment Recommendations Therapy as outlined in treatment plan below   Prognosis 01/05/2020 Prognosis for Safe Diet Advancement Fair Barriers to Reach Goals Cognitive deficits;Time post onset;Severity of deficits;Language deficits;Behavior Barriers/Prognosis Comment -- CHL IP DIET RECOMMENDATION 01/14/2020 SLP Diet Recommendations Dysphagia 2 (Fine chop) solids;Nectar thick liquid Liquid Administration via Spoon Medication Administration Whole meds with puree  Compensations Minimize environmental distractions;Slow rate;Small sips/bites;Other (Comment) Postural Changes Remain semi-upright after after feeds/meals (Comment);Seated upright at 90 degrees   CHL IP OTHER RECOMMENDATIONS 01/14/2020 Recommended Consults -- Oral Care Recommendations Oral care BID Other Recommendations Order thickener from pharmacy;Prohibited food (jello, ice cream, thin soups);Remove water pitcher;Have oral suction available   CHL IP FOLLOW UP RECOMMENDATIONS 01/05/2020 Follow up Recommendations [facility] D[facility]2020 Speech Therapy Frequency (ACUTE ONLY) min 3x week Treatment Duration 2 weeks      CHL IP ORAL PHASE 01/14/2020 Oral Phase Impaired Oral - Pudding Teaspoon -- Oral - Pudding Cup -- Oral - Honey Teaspoon -- Oral - Honey Cup Lingual pumping;Delayed oral transit;Lingual/palatal residue Oral - Nectar Teaspoon Premature spillage Oral - Nectar Cup Premature spillage;Decreased bolus cohesion Oral - Nectar Straw -- Oral - Thin Teaspoon -- Oral - Thin Cup Premature spillage;Decreased bolus cohesion Oral - Thin Straw -- Oral - Puree Lingual pumping Oral - Mech Soft -- Oral - Regular -- Oral - Multi-Consistency -- Oral - Pill Delayed oral transit Oral Phase - Comment --  CHL IP PHARYNGEAL PHASE 01/14/2020 Pharyngeal Phase Impaired Pharyngeal- Pudding Teaspoon -- Pharyngeal -- Pharyngeal- Pudding Cup -- Pharyngeal -- Pharyngeal- Honey Teaspoon -- Pharyngeal -- Pharyngeal- Honey Cup Delayed swallow initiation-vallecula Pharyngeal -- Pharyngeal- Nectar Teaspoon Delayed swallow initiation-vallecula;Delayed swallow initiation-pyriform sinuses Pharyngeal -- Pharyngeal- Nectar Cup Delayed swallow initiation-vallecula;Delayed swallow initiation-pyriform sinuses;Penetration/Aspiration during swallow Pharyngeal Material  enters airway, passes BELOW cords then ejected out;Material enters airway, CONTACTS cords and not ejected out Pharyngeal- Nectar Straw --  Pharyngeal -- Pharyngeal- Thin Teaspoon -- Pharyngeal -- Pharyngeal- Thin Cup Penetration/Aspiration during swallow;Delayed swallow initiation-pyriform sinuses Pharyngeal Material enters airway, CONTACTS cords and not ejected out Pharyngeal- Thin Straw -- Pharyngeal -- Pharyngeal- Puree Delayed swallow initiation-vallecula Pharyngeal -- Pharyngeal- Mechanical Soft -- Pharyngeal -- Pharyngeal- Regular -- Pharyngeal -- Pharyngeal- Multi-consistency -- Pharyngeal -- Pharyngeal- Pill WFL Pharyngeal -- Pharyngeal Comment --  CHL IP CERVICAL ESOPHAGEAL PHASE 01/14/2020 Cervical Esophageal Phase WFL Pudding Teaspoon -- Pudding Cup -- Honey Teaspoon -- Honey Cup -- Nectar Teaspoon -- Nectar Cup -- Nectar Straw -- Thin Teaspoon -- Thin Cup -- Thin Straw -- Puree -- Mechanical Soft -- Regular -- Multi-consistency -- Pill -- Cervical Esophageal Comment -- Alexander Byrd 01/14/2020, 10:41 AM              ECHOCARDIOGRAM COMPLETE  Result Date: 01/04/2020    ECHOCARDIOGRAM REPORT   Patient Name:   Alexander Byrd Date of Exam: 01/04/2020 Medical Rec #:  [id number]        Height:       72.0 in Accession #:    [id number]       Weight:       160.3 lb Date of Birth:  [birth year]       BSA:          1.939 m Patient Age:    69 years         BP:           133/85 mmHg Patient Gender: M                HR:           96 bpm. Exam Location:  [facility] Procedure: 2D Echo, Color Doppler, Cardiac Doppler and Intracardiac            Opacification Agent Indications:     I163.9 Stroke  History:         Patient has no prior history of Echocardiogram examinations.                  S/P stroke 3 wks prior; Arrythmias:Tachycardia.  Sonographer:     Humphrey Rolls RDCS (AE) Referring Phys:  [id number] Jimmye Norman Diagnosing Phys: Julien Nordmann MD  Sonographer Comments: Technically difficult study due to poor echo windows. Image acquisition challenging due to respiratory motion. IMPRESSIONS  1. Left ventricular ejection fraction, by estimation, is 60 to  65%. The left ventricle has normal function. The left ventricle has no regional wall motion abnormalities. Left ventricular diastolic parameters are consistent with Grade I diastolic dysfunction (impaired relaxation).  2. Right ventricular systolic function is normal. The right ventricular size is normal. FINDINGS  Left Ventricle: Left ventricular ejection fraction, by estimation, is 60 to 65%. The left ventricle has normal function. The left ventricle has no regional wall motion abnormalities. Definity contrast agent was given IV to delineate the left ventricular  endocardial borders. The left ventricular internal cavity size was normal in size. There is no left ventricular hypertrophy. Left ventricular diastolic parameters are consistent with Grade I diastolic dysfunction (impaired relaxation). Right Ventricle: The right ventricular size is normal. No increase in right ventricular wall thickness. Right ventricular systolic function is normal. Left Atrium: Left atrial size was normal in size. Right Atrium: Right atrial size was normal in size. Pericardium: There is no evidence of pericardial effusion. Mitral Valve: The mitral  valve is normal in structure. No evidence of mitral valve regurgitation. No evidence of mitral valve stenosis. MV peak gradient, 4.5 mmHg. The mean mitral valve gradient is 2.0 mmHg. Tricuspid Valve: The tricuspid valve is normal in structure. Tricuspid valve regurgitation is not demonstrated. No evidence of tricuspid stenosis. Aortic Valve: The aortic valve was not assessed. Aortic valve regurgitation is not visualized. No aortic stenosis is present. Aortic valve mean gradient measures 3.0 mmHg. Aortic valve peak gradient measures 4.7 mmHg. Aortic valve area, by VTI measures 3.08 cm. Pulmonic Valve: The pulmonic valve was normal in structure. Pulmonic valve regurgitation is not visualized. No evidence of pulmonic stenosis. Aorta: The aortic root is normal in size and structure. Venous: The  inferior vena cava is normal in size with greater than 50% respiratory variability, suggesting right atrial pressure of 3 mmHg. IAS/Shunts: No atrial level shunt detected by color flow Doppler.  LEFT VENTRICLE PLAX 2D LVIDd:         4.03 cm  Diastology LVIDs:         3.48 cm  LV e' medial:    7.83 cm/s LV PW:         1.07 cm  LV E/e' medial:  8.1 LV IVS:        0.92 cm  LV e' lateral:   10.20 cm/s LVOT diam:     2.10 cm  LV E/e' lateral: 6.2 LV SV:         59 LV SV Index:   30 LVOT Area:     3.46 cm  LEFT ATRIUM             Index LA diam:        3.80 cm 1.96 cm/m LA Vol (A2C):   37.7 ml 19.44 ml/m LA Vol (A4C):   34.9 ml 18.00 ml/m LA Biplane Vol: 37.7 ml 19.44 ml/m  AORTIC VALVE AV Area (Vmax):    3.24 cm AV Area (Vmean):   3.35 cm AV Area (VTI):     3.08 cm AV Vmax:           108.00 cm/s AV Vmean:          79.100 cm/s AV VTI:            0.191 m AV Peak Grad:      4.7 mmHg AV Mean Grad:      3.0 mmHg LVOT Vmax:         101.00 cm/s LVOT Vmean:        76.500 cm/s LVOT VTI:          0.170 m LVOT/AV VTI ratio: 0.89  AORTA Ao Root diam: 3.20 cm MITRAL VALVE MV Area (PHT): 8.92 cm    SHUNTS MV Peak grad:  4.5 mmHg    Systemic VTI:  0.17 m MV Mean grad:  2.0 mmHg    Systemic Diam: 2.10 cm MV Vmax:       1.06 m/s MV Vmean:      62.2 cm/s MV Decel Time: 85 msec MV E velocity: 63.40 cm/s MV A velocity: 93.00 cm/s MV E/A ratio:  0.68 Julien Nordmann MD Electronically signed by Julien Nordmann MD Signature Date/Time: 01/04/2020/1:03:51 PM    Final     Labs:  Basic Metabolic Panel: Recent Labs  Lab 01/28/20 0751  NA 141  K 3.9  CL 108  CO2 24  GLUCOSE 130*  BUN 7*  CREATININE 1.04  CALCIUM 9.5    CBC: Recent Labs  Lab 01/28/20 0751  WBC 4.0  NEUTROABS 1.8  HGB 12.9*  HCT 38.8*  MCV 90.0  PLT 239    CBG: No results for input(s): GLUCAP in the last 168 hours.  Family history.  Positive for hypertension hyperlipidemia.  Denies any colon cancer esophageal cancer or rectal cancer  Brief HPI:    Aaditya Letizia is a 70 y.o. 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  He was initially admitted to [facility] after his most recent CVA discharge to skilled nursing facility where he developed abdominal pain nausea and vomiting.  Prior to CVA patient lives with spouse 1 level home ramped entrance was independent working full-time.  Presented to [facility] 12/31/2019.  EEG negative for seizure.  CT/MRI showed subtle increase diffusion abnormality involving the left pons middle cerebellar peduncle and adjacent left cerebellar hemisphere.  No other acute abnormality.  There is a few scattered remote lacunar infarcts about the left basal ganglia and corona radiata.  CT of abdomen pelvis showed findings suggestive of enteritis.  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  Short segment moderate stenosis involving the mid right M1 segment.  Echocardiogram with ejection fraction of 60 to 65% no wall motion abnormalities grade 1 diastolic dysfunction.  Follow-up MRI completed findings consistent with left PICA infarction advised continue aspirin and Plavix.  Admission chemistries unremarkable except glucose 169 creatinine 1.48 lipase 33 urinalysis negative urine culture no growth lactic acid 4.4 C. difficile negative.  Hospital course work-up for sepsis related to acute enteritis placed on azithromycin x3 days as well as gentle IV fluids.  Latest creatinine 1.07.  Currently maintained on dysphagia #1 honey thick liquid diet.  Subcutaneous Lovenox for DVT prophylaxis.  Due to patient's decrease in functional mobility cognitive deficits he was admitted for a comprehensive rehab  program   Hospital Course: Yehoshua Vitelli was admitted to rehab 01/09/2020 for inpatient therapies to consist of PT, ST and OT at least three hours five days a week. Past admission physiatrist, therapy team and rehab RN have worked together to provide customized collaborative inpatient rehab.  Pertaining to patient's left PICA infarction as well as recent CVA 3 weeks ago he remained on aspirin and Plavix daily x3 months then aspirin alone.  Subcutaneous Lovenox for DVT prophylaxis.  Blood pressure controlled on Norvasc.  Lipitor ongoing for hyperlipidemia.  Noted history of BPH maintained on Flomax with routine toileting.  As noted patient with recent sepsis related to acute enteritis completed course of azithromycin remained afebrile.  AKI resolved latest creatinine 1.04.  Noted dysphagia related to CVA mechanical soft with thin liquids he was hydrating well initially and IV fluids had since been discontinued.   Blood pressures were monitored on TID basis and controlled     Rehab course: During patient's stay in rehab weekly team conferences were held to monitor patient's progress, set goals and discuss barriers to discharge. At admission, patient required +2 physical assist sit to stand moderate assist sit to supine mod assist supine to sit.  Min assist self-feeding edge of bed assistance for balance  Physical exam.  Blood pressure 147/91 pulse 88 temperature 99 respirations 18 oxygen saturation 96% room air Constitutional.  No acute distress HEENT Head.  Normocephalic and  atraumatic Eyes.  Pupils round and reactive to light no discharge.nystagmus Neck.  Supple nontender no JVD without thyromegaly Cardiac regular rate rhythm not extra sounds or murmur heard Abdomen.  Soft nontender positive bowel sounds without rebound Respiratory effort normal no respiratory distress without wheeze Musculoskeletal.  No edema or tenderness in extremities Neurologic.  Patient alert limited attention delay  in processing question expressive aphasia follows simple commands strength grossly graded 4+/5 throughout  He/She  has had improvement in activity tolerance, balance, postural control as well as ability to compensate for deficits. He/She has had improvement in functional use RUE/LUE  and RLE/LLE as well as improvement in awareness.  Perform sit to supine with supervision.  Min assist provided for bed positioning.  Perform sit to stand platform rolling walker moderate assist.  Ambulates using platform walker 23 feet moderate assist and up to 42 feet min mod assist.  He completed squat pivot transfers to the wheelchair from the bed as well as to the tub bench during sessions.  Family teaching ongoing.  His ex spouse was able to assist with sit to stand during lower body dressing as well as lower body dressing task at min mod assist.  He needed moderate instructional cueing for some sequencing.  Transferred out of the shower chair after bathing minimal assist squat pivot and worked on dressing at the sink.  Supervision for upper body dressing and application of deodorant.  He then completed donning briefs and pants with moderate assist.  Follow-up speech therapy for dysphagia as well as speech and communication.  He was able to communicate his needs.  Full family teaching completed and discharged to home       Disposition: Discharged to home    Diet: Mechanical soft thin liquids  Special Instructions: No driving smoking or alcohol  Plan is for aspirin and Plavix x3 months then aspirin alone  Medications at discharge 1.  Tylenol as needed 2.  Norvasc 5 mg p.o. daily 3.  Aspirin 81 mg p.o. daily 4.  Plavix 75 mg daily 5.  Flomax 0.4 mg daily 6.  Claritin 10 mg p.o. daily   30-35 minutes were spent completing discharge summary and discharge planning Discharge Instructions    Ambulatory referral to Neurology   Complete by: As directed    An appointment is requested in approximately 4 weeks  left PICA infarction   Ambulatory referral to Physical Medicine Rehab   Complete by: As directed    Moderate complexity follow-up 1 to 2 weeks left PCA infarction     Allergies as of 01/31/2020   No Known Allergies     Medication List    STOP taking these medications   feeding supplement (PRO-STAT SUGAR FREE 64) Liqd   fluticasone 50 MCG/ACT nasal spray Commonly known as: FLONASE   potassium chloride 20 MEQ packet Commonly known as: KLOR-CON   senna-docusate 8.6-50 MG tablet Commonly known as: Senokot-S     TAKE these medications   acetaminophen 325 MG tablet Commonly known as: TYLENOL Take 2 tablets (650 mg total) by mouth every 6 (six) hours as needed for mild pain (or Fever >/= 101).   amLODipine 5 MG tablet Commonly known as: NORVASC Take 1 tablet (5 mg total) by mouth daily.   aspirin 81 MG chewable tablet Chew 81 mg by mouth daily.   atorvastatin 80 MG tablet Commonly known as: LIPITOR Take 1 tablet (80 mg total) by mouth daily at 6 PM.   clopidogrel 75 MG tablet Commonly known  as: PLAVIX Take 1 tablet (75 mg total) by mouth daily.   cromolyn 4 % ophthalmic solution Commonly known as: OPTICROM Place 2 drops into the left eye 4 (four) times daily for 15 days. 0600, 1200, 1800, 0000   loratadine 10 MG tablet Commonly known as: CLARITIN Take 10 mg by mouth daily.   polyethylene glycol 17 g packet Commonly known as: MIRALAX / GLYCOLAX Take 17 g by mouth daily. MIX IN 4-8 OZ OF FLUID   tamsulosin 0.4 MG Caps capsule Commonly known as: FLOMAX Take 1 capsule (0.4 mg total) by mouth daily after supper.       Follow-up Information    Kirsteins, Victorino SparrowAndrew E, MD Follow up.   Specialty: Physical Medicine and Rehabilitation Why: Office to call for appointment Contact information: [address] [place] [address] [phone]               Signed: Charlton AmorDaniel J Shaunette Gassner 01/31/2020, 5:07 AM

## 2020-01-31 NOTE — Progress Notes (Signed)
Physical Therapy Session Note  Patient Details  Name: Alexander Byrd MRN: [id number] Date of Birth: 1950-09-13  Today's Date: 01/31/2020 PT Individual Time: 1002-1114 PT Individual Time Calculation (min): 72 min   Short Term Goals: Week 3:  PT Short Term Goal 1 (Week 3): STG=LTG due to ELOS  Skilled Therapeutic Interventions/Progress Updates:    Patient supine in bed upon PT arrival to room. Patient alert and agreeable to PT session. Patient denied pain during session.  Therapeutic Activity: Bed Mobility: Patient performed supine to/from sit with supervision and minimal vc provided for technique and using L hand to EOB to assist with scoot to EOB. Return to supine with supervision and vc provided only for neutral body positioning. Transfers: Patient performed sit to/from stand at EOB in order to perform dressing. Req'd  Mod A for balance using therapist's elbows for BUE support as pt demonstrating pushing toward L again this day. Pt able to pull pants up using mainly LUE with Mod A for maintaining balance. Provided verbal cues for weight shift to RLE and increasing L knee extension.  Gait Training:  Patient ambulated 7' x1 using PFRW with Min to Mod A for maintaining balance. Amb attempted using RW with no platforms and pt requiring Mod A for balance with increased note of pushing toward L side.  Provided verbal cues for upright posture, LUE and LLE extension, lateral weight shift to R side.   Wheelchair Mobility:  Patient propelled wheelchair  through cones with instructions for using BUE as well as BLE. Cones placed 5' apart for total of 25' and pt is able to maneuver well with extra time and vc only to remind pt to use BLE. Cones narrowed to 4' and pt demonstrates increase in difficulty with smaller radius turns but only knocks one cone. Good pace with push of w/c >200' back to room with reminder to use BUE as well as BLE.   Neuromuscular Re-ed: NMR reed facilitated throughout all  activities and mobility in this session through verbal and tactile feedback for each aspect of decreasing pt's push to L side. Combination of muscle tapping, visual feedback from mirror, pt feedback re: proprioception used throughout. Pt guided in minisquat activity outside of // bars with focus on equal weight bearing through BLE, increasing L knee extension and L elbow extension with each rise. Using muscle tapping to L quad to assist with muscle activtation. Dual task activity requiring reach to pt's R side in order to promote weight shift to R, R knee flexion with L knee extension and increased pushing through LUE.   Patient supine in bed at end of session with bed alarm set, and all needs within reach to his R side.     Therapy Documentation Precautions:  Precautions Precautions: Fall Precaution Comments: R>L hemi, R inattention. Poor postural control. Restrictions Weight Bearing Restrictions: No General:      Therapy/Group: Individual Therapy  Loel Dubonnet 01/31/2020, 12:00 PM

## 2020-01-31 NOTE — Progress Notes (Signed)
Speech Language Pathology Daily Session Note  Patient Details  Name: Alexander Byrd MRN: [id number] Date of Birth: 1950/04/09  Today's Date: 01/31/2020 SLP Individual Time: 0730-0825 SLP Individual Time Calculation (min): 55 min  Short Term Goals: Week 3: SLP Short Term Goal 1 (Week 3): STG=LTG due to remaining length of stay  Skilled Therapeutic Interventions: Pt was seen for skilled ST targeting dysphagia and cognitive goals. SLP introduced Provale cup for pt's liquid intake to help control bolus size (cup limits bolus to 10 CC). Initially, pt exhibited 2 coughs and 1 throat clear on initial sips. As intake progressed, [t became more familiar with mechanics of the cup and coughing ceased. Spoke with RN, emphasizing need for use of spoon or the Provale cup for thin liquid intake. During consumption of his dysphagia 3 breakfast, pt's mastication and oral clearance was efficient, and he used all swallow precautions with Supervision. SLP further facilitated session with administration of SLUMS Examination as means of post-test for this admission. Pt scored 14/30 which is still indicative of moderately severe neurocognitive impairments. Working and short term memory noted to significantly impact all formal means of testing, however with Min cues, pt able to perform tasks related to problem solving and memory more accurately. Pt left semi-reclined in bed with alarm set and needs within reach. Continue per current plan of care.          Pain Pain Assessment Pain Scale: 0-10 Pain Score: 0-No pain  Therapy/Group: Individual Therapy  [name] 01/31/2020, 7:27 AM

## 2020-01-31 NOTE — Progress Notes (Addendum)
Patient ID: Alexander Byrd, male   DOB: 09/10/1950, 70 y.o.   MRN: [id number]   Patient [facility] referral sent to [facility] for review. SW will follow up with updates.  *[facility] currently does not have OT staff. SW sending referral out to [facility].    [place], [place] [phone]

## 2020-01-31 NOTE — Progress Notes (Signed)
Occupational Therapy Session Note  Patient Details  Name: Alexander Byrd MRN: [id number] Date of Birth: 05-Aug-1950  Today's Date: 01/31/2020 OT Individual Time: 1302-1401 OT Individual Time Calculation (min): 59 min    Short Term Goals: Week 3:  OT Short Term Goal 1 (Week 3): STGs=LTGs set at [employer] A overall  Skilled Therapeutic Interventions/Progress Updates:    Pt received in w/c with ex spouse Synetta Fail present, agreeable to therapy. Session focus on family edu with Synetta Fail re: squat-pivots, sit-stands in prep for toilet transfers + clothing management. After therapist demonstration + verbal explanation, Synetta Fail facilitated multiple squat-pivot transfer from bed to w/c with overall min A for BLE placement + to pivot hips. Demonstrated and trialed 2 methods of transfer, Synetta Fail stating that she prefers the "over-back" method. Then completed massed practice of sit<>stands from w/c with overall min A with Synetta Fail facilitating and Event organiser. Then at sink, completed 3 total sit<>stands with min A for initial lift + balance. Pt stood to wash hands at sink, req min A to block L knee + for balance. When BUE are supported at sink, pt is overall CGA level. Transferred back to bed via squat-pivot with min A for BLE placement + to pivot hips. Sit EOB > side lying with mod A to bring BLE on bed, side lying > supine with min A to adjust trunk in bed. Synetta Fail with no additional questions at this time. Left semi-reclined in bed, bed alarm engaged, Synetta Fail present, call bell in reach.    Therapy Documentation Precautions:  Precautions Precautions: Fall Precaution Comments: R>L hemi, R inattention. Poor postural control. Restrictions Weight Bearing Restrictions: No Pain: Pain Assessment Pain Scale: 0-10 Pain Score: 0-No pain Faces Pain Scale: No hurt ADL: See Care Tool for more details.   Therapy/Group: Individual Therapy  Claudie Revering, MS ,OTR/L Perrin Maltese OTR/L  01/31/2020, 3:53  PM

## 2020-01-31 NOTE — Progress Notes (Signed)
[place] PHYSICAL MEDICINE & REHABILITATION PROGRESS NOTE   Subjective/Complaints:  Pt in room working with SLP on cognitive re eval  No c/os, he is having coughing with thin liquids if he "takes too much"  ROS: Denies CP, SOB, N/V/D,Objective:   DG Swallowing Func-Speech Pathology  Result Date: 01/30/2020 Objective Swallowing Evaluation: Type of Study: MBS-Modified Barium Swallow Study  Patient Details Name: Alexander Byrd MRN: [id number] Date of Birth: 1950-12-10 Today's Date: 01/30/2020 Past Medical History: No past medical history on file. Past Surgical History: No past surgical history on file. HPI: Pt is a 70 y.o. 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 More confusion per family report as well. Patient is altered on arrival and unable to write any verbal history.  He is accompanied by his sister who states that typically his able to speak and only has weakness in his left side.  Unsure of pt's Baseline Cognitive-communication status or functional status for safe po intake, self-feeding as there were no notes in the chart in Care Everywhere from his recent CVA at "[facility]" ~3 weeks ago. Pt admitted to [facility] CIR 01/09/20.  Assessment / Plan / Recommendation CHL IP CLINICAL IMPRESSIONS 01/30/2020 Clinical Impression Although mild oropharyngeal dysphagia still evident, pt demonstrated functional improvement in swallow function today in comparison to last MBSS conducted on 01/14/20. Pt still exhibits premature spillage of liquid boluses into the pharynx, with PO filling the pyriform sinuses for 2-5 seconds prior to swallow initiation. Aspiration X2 and penetration to the level of the vocal folds X1 noted during self-fed cup sips of thin barium (PAS scores of 4, 6, and 7).  However, when boluses were administered via TSP, no penetration or aspiration was observed. Suspect this is due to smaller bolus size, as pt has difficulty regulating bolus size when drinking from a cup himself, despite cues. Pt did sense aspirates via a consistent throat clear or cough response, which was intermittently effective to clear aspirates/penetrates from vestibule after cup sips. Recommend pt upgrade to thin liquids, but he MUST CONSUME VIA TEASPOON OR PROVALE CUP ONLY to help control bolus size. SLP will introduce Provale cup during next available treatment session. Also recommend continue Dysphagia 3 (mechanical soft) solids and pills whole in applesauce, full supervision to ensure use of compensatory swallow strategies. SLP Visit Diagnosis Dysphagia, oropharyngeal phase (R13.12) Attention and concentration deficit following -- Frontal lobe and executive function deficit following -- Impact on safety and function Mild aspiration risk   CHL IP TREATMENT RECOMMENDATION 01/05/2020 Treatment Recommendations Therapy as outlined in treatment plan below   Prognosis 01/05/2020 Prognosis for Safe Diet Advancement Fair Barriers to Reach Goals Cognitive deficits;Time post onset;Severity of deficits;Language deficits;Behavior Barriers/Prognosis Comment -- CHL IP DIET RECOMMENDATION 01/30/2020 SLP Diet Recommendations Dysphagia 3 (Mech soft) solids;Thin liquid Liquid Administration via Spoon;Other (Comment) Medication Administration Whole meds with puree Compensations Minimize environmental distractions;Slow rate;Small sips/bites;Other (Comment) Postural Changes Remain semi-upright after after feeds/meals (Comment);Seated upright at 90 degrees   CHL IP OTHER RECOMMENDATIONS 01/30/2020 Recommended Consults -- Oral Care Recommendations Oral care BID Other Recommendations --   CHL IP FOLLOW UP RECOMMENDATIONS 01/05/2020 Follow up Recommendations Skilled Nursing facility   [facility] IP FREQUENCY AND DURATION 01/05/2020 Speech  Therapy Frequency (ACUTE ONLY) min 3x week Treatment Duration 2 weeks  CHL IP ORAL PHASE 01/30/2020 Oral Phase Impaired Oral - Pudding Teaspoon -- Oral - Pudding Cup -- Oral - Honey Teaspoon -- Oral - Honey Cup NT Oral - Nectar Teaspoon NT Oral - Nectar Cup Decreased bolus cohesion;Premature spillage Oral - Nectar Straw -- Oral - Thin Teaspoon Decreased bolus cohesion;Premature spillage Oral - Thin Cup Decreased bolus cohesion;Premature spillage Oral - Thin Straw Decreased bolus cohesion;Premature spillage Oral - Puree WFL Oral - Mech Soft -- Oral - Regular -- Oral - Multi-Consistency -- Oral - Pill NT Oral Phase - Comment --  CHL IP PHARYNGEAL PHASE 01/30/2020 Pharyngeal Phase Impaired Pharyngeal- Pudding Teaspoon -- Pharyngeal -- Pharyngeal- Pudding Cup -- Pharyngeal -- Pharyngeal- Honey Teaspoon -- Pharyngeal -- Pharyngeal- Honey Cup NT Pharyngeal -- Pharyngeal- Nectar Teaspoon NT Pharyngeal -- Pharyngeal- Nectar Cup Delayed swallow initiation-pyriform sinuses Pharyngeal Material does not enter airway Pharyngeal- Nectar Straw -- Pharyngeal -- Pharyngeal- Thin Teaspoon Delayed swallow initiation-pyriform sinuses;Penetration/Aspiration during swallow Pharyngeal Material does not enter airway Pharyngeal- Thin Cup Delayed swallow initiation-pyriform sinuses;Penetration/Aspiration during swallow Pharyngeal Material enters airway, passes BELOW cords then ejected out;Material enters airway, CONTACTS cords and then ejected out Pharyngeal- Thin Straw Delayed swallow initiation-pyriform sinuses;Penetration/Aspiration during swallow Pharyngeal Material enters airway, passes BELOW cords and not ejected out despite cough attempt by patient Pharyngeal- Puree WFL Pharyngeal -- Pharyngeal- Mechanical Soft -- Pharyngeal -- Pharyngeal- Regular -- Pharyngeal -- Pharyngeal- Multi-consistency -- Pharyngeal -- Pharyngeal- Pill NT Pharyngeal -- Pharyngeal Comment --  CHL IP CERVICAL ESOPHAGEAL PHASE 01/30/2020 Cervical Esophageal Phase  WFL Pudding Teaspoon -- Pudding Cup -- Honey Teaspoon -- Honey Cup -- Nectar Teaspoon -- Nectar Cup -- Nectar Straw -- Thin Teaspoon -- Thin Cup -- Thin Straw -- Puree -- Mechanical Soft -- Regular -- Multi-consistency -- Pill -- Cervical Esophageal Comment -- [name] 01/30/2020, 10:56 AM              No results for input(s): WBC, HGB, HCT, PLT in the last 72 hours. No results for input(s): NA, K, CL, CO2, GLUCOSE, BUN, CREATININE, CALCIUM in the last 72 hours.  Intake/Output Summary (Last 24 hours) at 01/31/2020 0803 Last data filed at 01/30/2020 2101 Gross per 24 hour  Intake 650 ml  Output 294 ml  Net 356 ml        Physical Exam: Vital Signs Blood pressure 134/86, pulse 92, temperature 98.4 F (36.9 C), temperature source Oral, resp. rate 20, height 5\' 7"  (1.702 m), weight 69.2 kg, SpO2 96 %.    General: No acute distress Mood and affect are appropriate Heart: Regular rate and rhythm no rubs murmurs or extra sounds Lungs: Clear to auscultation, breathing unlabored, no rales or wheezes Abdomen: Positive bowel sounds, soft nontender to palpation, nondistended Extremities: No clubbing, cyanosis, or edema Skin: No evidence of breakdown, no evidence of rash    Sensory exam normal sensation to light touch and proprioception in bilateral upper and lower extremities Cerebellar exam ataxia L>R finger to nose to finger Musculoskeletal: Full range of motion in all 4 extremities. No joint swelling  Psych: Blunted.  Flat.  Delayed.   Musc: No edema in extremities.  No tenderness in extremities. Neuro: Alert and oriented x1   Assessment/Plan: 1. Functional deficits which require 3+ hours per day of interdisciplinary therapy in a comprehensive inpatient rehab setting. Physiatrist is providing close team supervision and 24 hour management of active medical problems listed below. Physiatrist and rehab team continue to assess barriers to discharge/monitor patient progress toward functional  and medical goals  Care Tool:  Bathing    Body parts bathed by patient: Chest,Abdomen,Face,Right arm,Left arm,Right upper leg,Left upper leg,Right lower leg,Left lower leg,Front perineal area   Body parts bathed by helper: Buttocks Body parts n/a: Buttocks,Front perineal area (pt previously cleansed)   Bathing assist Assist Level: Minimal Assistance - Patient > 75%     Upper Body Dressing/Undressing Upper body dressing   What is the patient wearing?: Pull over shirt    Upper body assist Assist Level: Supervision/Verbal cueing    Lower Body Dressing/Undressing Lower body dressing      What is the patient wearing?: Pants,Incontinence brief     Lower body assist Assist for lower body dressing: Moderate Assistance - Patient 50 - 74% (sit to stand)     Toileting Toileting Toileting Activity did not occur (Clothing management and hygiene only): N/A (no void or bm)  Toileting assist Assist for toileting: Moderate Assistance - Patient 50 - 74%     Transfers Chair/bed transfer  Transfers assist  Chair/bed transfer activity did not occur: Safety/medical concerns  Chair/bed transfer assist level: Minimal Assistance - Patient > 75% (squat pivot)     Locomotion Ambulation   Ambulation assist      Assist level: Moderate Assistance - Patient 50 - 74% Assistive device: Walker-platform Max distance: 45'   Walk 10 feet activity   Assist  Walk 10 feet activity did not occur: Safety/medical concerns  Assist level: Moderate Assistance - Patient - 50 - 74% Assistive device: Walker-platform   Walk 50 feet activity   Assist Walk 50 feet with 2 turns activity did not occur: Safety/medical concerns         Walk 150 feet activity   Assist Walk 150 feet activity did not occur: Safety/medical concerns         Walk 10 feet on uneven surface  activity   Assist Walk 10 feet on uneven surfaces activity did not occur: Safety/medical concerns          Wheelchair     Assist Will patient use wheelchair at discharge?: Yes Type of Wheelchair: Manual    Wheelchair assist level: Set up assist Max wheelchair distance: >200'    Wheelchair 50 feet with 2 turns activity    Assist        Assist Level: Supervision/Verbal cueing   Wheelchair 150 feet activity     Assist      Assist Level: Supervision/Verbal cueing   Blood pressure 134/86, pulse 92, temperature 98.4 F (36.9 C), temperature source Oral, resp. rate 20, height 5\' 7"  (1.702 m), weight 69.2 kg, SpO2 96 %.  Medical Problem List and Plan: 1.  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  Continue CIR,PT< OT< SLP Tent D/C 1/8  2.  Antithrombotics: -DVT/anticoagulation: Lovenox             -antiplatelet therapy: Aspirin 81 mg daily and Plavix 75 mg daily x3 months then aspirin daily 3. Pain Management: Continue Tylenol as needed  RIght foot x-ray unremarkable  Controlled on 1/4 4. Mood: Provide emotional support             -antipsychotic agents: N/A 5. 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 Skin/Wound Care: Routine skin checks.  7. Fluids/Electrolytes/Nutrition: Routine and outs.  BMP normal 1/3 8.  Hypertension.  Continue Norvasc 5 mg daily.  Vitals:   01/30/20 2030 01/31/20 0316  BP: 129/87 134/86  Pulse: (!) 101 92  Resp: 20 20  Temp: 98.4 F (36.9 C)   SpO2: 95% 96%   Controlled on 1/6  9.  Hyperlipidemia: Lipitor 10.  BPH.  Continue Flomax 0.4 mg daily, increased on 1/2.               PVRs x1 showing retention with incontinence 11.  Severe sepsis with acute enteritis.  Completed course of azithromycin.  Monitor hydration. 12.  AKI: resolved.  13.  Post stroke dysphagia: D3 nectar, advance diet as tolerated-d/c IV and enc po as discharge is  <1wk 14. Nausea: continue zofran prn  15.  Constipation cont.  Senna S  Improving 16. SInus Tachycardia:Mild  intermittent, continue to monitor.not due to anemia, no hypoxia, hydration status is good  17.  Mild/borderline normochromic normocytic anemia, asymptomatic  hgb 12.9 LOS: 22 days A FACE TO FACE EVALUATION WAS PERFORMED  Charlett Blake 01/31/2020, 8:03 AM

## 2020-02-01 ENCOUNTER — Inpatient Hospital Stay (HOSPITAL_COMMUNITY): Payer: Medicare HMO | Admitting: Physical Therapy

## 2020-02-01 ENCOUNTER — Inpatient Hospital Stay (HOSPITAL_COMMUNITY): Payer: Medicare HMO | Admitting: Occupational Therapy

## 2020-02-01 ENCOUNTER — Inpatient Hospital Stay (HOSPITAL_COMMUNITY): Payer: Medicare HMO

## 2020-02-01 NOTE — Progress Notes (Incomplete)
Inpatient Rehabilitation Care Coordinator Discharge Note  The overall goal for the admission was met for:   Discharge location: Yes, home  Length of Stay: Yes, 28 Days  Discharge activity level: Yes  Home/community participation: Yes  Services provided included: MD, RD, PT, OT, SLP, RN, CM, TR, Pharmacy, [place]: Medicare  Choices offered to/list presented FB:XUXYBF Raiford Noble)  Follow-up services arranged: Home Health: [facility]  Comments (or additional information): HB, WC Drop Arm [employer], RW PR OT SPH AIDE  Patient/Family verbalized understanding of follow-up arrangements: Yes  Individual responsible for coordination of the follow-up plan: Raiford Noble [phone]  Confirmed correct DME delivered: Dyanne Iha 02/01/2020    Dyanne Iha

## 2020-02-01 NOTE — Progress Notes (Signed)
Patient ID: Alexander Byrd, male   DOB: [birth year], 70 y.o.   MRN: [id number]   Patient decline by [facility] due to staffing. Pt declined by [facility] due to staffing.  [place], [place] [phone]

## 2020-02-01 NOTE — Plan of Care (Signed)
  Problem: RH Balance Goal: LTG Patient will maintain dynamic standing balance (PT) Description: LTG:  Patient will maintain dynamic standing balance with assistance during mobility activities (PT) Outcome: Adequate for Discharge   Problem: RH Wheelchair Mobility Goal: LTG Patient will propel w/c in community environment (PT) Description: LTG: Patient will propel wheelchair in community environment, # of feet with assist (PT) Outcome: Adequate for Discharge   Problem: RH Ambulation Goal: LTG Patient will ambulate in home environment (PT) Description: LTG: Patient will ambulate in home environment, # of feet with assistance (PT). Outcome: Not Met (add Reason)   Problem: RH Balance Goal: LTG Patient will maintain dynamic sitting balance (PT) Description: LTG:  Patient will maintain dynamic sitting balance with assistance during mobility activities (PT) Outcome: Completed/Met   Problem: Sit to Stand Goal: LTG:  Patient will perform sit to stand with assistance level (PT) Description: LTG:  Patient will perform sit to stand with assistance level (PT) Outcome: Completed/Met   Problem: RH Bed Mobility Goal: LTG Patient will perform bed mobility with assist (PT) Description: LTG: Patient will perform bed mobility with assistance, with/without cues (PT). Outcome: Completed/Met   Problem: RH Bed to Chair Transfers Goal: LTG Patient will perform bed/chair transfers w/assist (PT) Description: LTG: Patient will perform bed to chair transfers with assistance (PT). Outcome: Completed/Met   Problem: RH Car Transfers Goal: LTG Patient will perform car transfers with assist (PT) Description: LTG: Patient will perform car transfers with assistance (PT). Outcome: Completed/Met   Problem: RH Furniture Transfers Goal: LTG Patient will perform furniture transfers w/assist (OT/PT) Description: LTG: Patient will perform furniture transfers  with assistance (OT/PT). Outcome: Completed/Met    Problem: RH Ambulation Goal: LTG Patient will ambulate in controlled environment (PT) Description: LTG: Patient will ambulate in a controlled environment, # of feet with assistance (PT). Outcome: Completed/Met   Problem: RH Wheelchair Mobility Goal: LTG Patient will propel w/c in controlled environment (PT) Description: LTG: Patient will propel wheelchair in controlled environment, # of feet with assist (PT) Outcome: Completed/Met Goal: LTG Patient will propel w/c in home environment (PT) Description: LTG: Patient will propel wheelchair in home environment, # of feet with assistance (PT). Outcome: Completed/Met

## 2020-02-01 NOTE — Progress Notes (Signed)
Physical Therapy Discharge Summary  Patient Details  Name: Alexander Byrd MRN: [id number] Date of Birth: [birth year]  Today's Date: 02/01/2020 PT Individual Time: 1245-8099 PT Individual Time Calculation (min): 74 min    Patient has met 9 of 12 long term goals due to improved activity tolerance, improved balance, improved postural control, increased strength, ability to compensate for deficits, functional use of  right upper extremity, right lower extremity, left upper extremity and left lower extremity, improved attention, improved awareness and improved coordination.  Patient to discharge at a wheelchair level [place].   Patient's care partner is independent to provide the necessary physical assistance at discharge.  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  Recommendation:  Patient will benefit from ongoing skilled PT services in home health setting to continue to advance safe functional mobility, address ongoing impairments in coordination, balance, standing and sitting tolerance, transfers, gait, safety awareness, and minimize fall risk.  Equipment: wheelchair, RW, hospital bed  Reasons for discharge: treatment goals met and discharge from hospital  Patient/family agrees with progress made and goals achieved: Yes   PT Treatment: Patient supine in bed upon PT arrival. Patient alert and agreeable to PT session. Patient denied pain during session.  Therapeutic Activity: Bed Mobility: Patient performed supine to/from sit with Mod I requiring extra time to complete and no cueing req'd. Transfers: Patient performed sit to/from stand with CGA and intermittent Min A to [place]. SPVT performed w/c <> bed using bed rails or w/c armrests with CGA and continued vc for controlled movements especially with descent to sit.  Gait Training:  Patient ambulated 50 feet using PFRW with Min A for balance to counter pushing as well as slight  walker mgmt. Heavy BUE support into platforms. Provided verbal cues for upright posture, level gaze, increased step height/ length, improving weight shift to R side.   Wheelchair Mobility:  Patient propelled wheelchair >250 feet back to room from therapy gym with Mod I. Improved speed and control demonstrated with BUE use only.   Neuromuscular Re-ed: NMR facilitated throughout session with feedback and cueing re: dynamic standing balance/ gait, stair stepping, and during FIST testing. Pt is able to use BUE for support on handrails with attempt at 6" step. He is able to complete one step up and back with heavy verbal cueing for sequencing, weight shifting and upright posture. Requires Min A to maintain balance.   Function In Sitting Test (FIST)  (1/2 femur on surface; hips/knees flexed to 90deg)   - indicate bed or mat table / step stool if used  SCORING KEY: 4 = Independent (completes task independently & successfully) 3 = Verbal Cues/Increased Time (completes task independently & successfully and only needs more time/cues) 2 = Upper Extremity Support (must use UE for support or assistance to complete successfully) 1 = Needs Assistance (unable to complete w/o physical assist; DOCUMENT LEVEL: min, mod, max) 0 = Dependent (requires complete physical assist; unable to complete successfully even w/ physical assist)  Randomly Administer Once Throughout Exam  4 - Anterior Nudge (superior sternum)  4 - Posterior Nudge (between scapular spines)  4 - Lateral Nudge (to dominant side at acromion)     4 - Static sitting (30 seconds)  4 - Sitting, shake 'no' (left and right)  4 - Sitting, eyes closed (30 seconds)   4 - Sitting, lift foot (dominant side, lift foot 1 inch twice)    1 - Pick up object from behind (object  at midline, hands breadth posterior)  2 - Forward reach (use dominant arm, must complete full motion) 3 - Lateral reach (use dominant arm, clear opposite ischial tuberosity) 3 -  Pick up object from floor (from between feet)   2 - Posterior scooting (move backwards 2 inches)  2 - Anterior scooting (move forward 2 inches)  2 - Lateral scooting (move to dominant side 2 inches)    TOTAL = 43/56  Notes/comments: Pt has demonstrated significant improvement in overall sitting balance and orientation to midline with no UE support.   MCD > 5 points MCID for IP REHAB > 6 points  Patient supine in bed at end of session with bed alarm set, and all needs within reach.    PT Discharge Precautions/Restrictions Precautions Precautions: Fall Precaution Comments: R>L hemi, R inattention. Restrictions Weight Bearing Restrictions: No Vital Signs Therapy Vitals Pulse Rate: 85 Resp: 17 BP: 138/86 Patient Position (if appropriate): Lying Oxygen Therapy SpO2: 97 % O2 Device: Room Air Pain Pain Assessment Pain Scale: 0-10 Pain Score: 0-No pain Vision/Perception  Vision - Assessment Eye Alignment: Within Functional Limits Ocular Range of Motion: Within Functional Limits Alignment/Gaze Preference: Gaze left Tracking/Visual Pursuits: Decreased smoothness of horizontal tracking;Decreased smoothness of vertical tracking Saccades: Decreased speed of saccadic movement;Additional eye shifts occurred during testing Convergence: Impaired (comment) (L eye does not converge) Perception Inattention/Neglect: Appears intact Praxis Praxis: Intact  Cognition Overall Cognitive Status: Impaired/Different from baseline Arousal/Alertness: Awake/alert Orientation Level: Oriented to person;Oriented to place;Oriented to time;Oriented to situation Attention: Sustained Sustained Attention: Appears intact Safety/Judgment: Appears intact Sensation Sensation Light Touch: Appears Intact Hot/Cold: Appears Intact Proprioception: Appears Intact Stereognosis: Appears Intact Coordination Gross Motor Movements are Fluid and Coordinated: No Fine Motor Movements are Fluid and Coordinated:  Yes Coordination and Movement Description: [facility] grossly WFL for ADL tasks, delayed iniatiation and slow gross motor movements overall during functional mobility Finger Nose Finger Test: improved to only mildly impaired requiring extra time Heel Shin Test: continued ataxic movements bilaterally with L>R Motor  Motor Motor: Abnormal postural alignment and control;Hemiplegia Motor - Discharge Observations: minimal weakness in RUE compared to LUE, kyphotic posture, mod L lean  Mobility Bed Mobility Bed Mobility: Rolling Right;Rolling Left;Right Sidelying to Sit;Supine to Sit;Sit to Supine;Scooting to [facility];Sitting - Scoot to [employer] of Bed Rolling Right: Independent with assistive device Rolling Left: Independent with assistive device Right Sidelying to Sit: Independent with assistive device Supine to Sit: Independent with assistive device Sitting - Scoot to Edge of Bed: Independent with assistive device Sit to Supine: Independent with assistive device Sit to Sidelying Right: Minimal Assistance - Patient > 75% Scooting to [facility]: Independent with assistive device Transfers Transfers: Sit to Stand;Stand to [employer] Transfers;Lateral/Scoot Transfers Sit to Stand: Contact Guard/Touching assist Stand to Sit: Contact Guard/Touching assist Stand Pivot Transfers: Minimal Assistance - Patient > 75% Stand Pivot Transfer Details: Verbal cues for precautions/safety;Manual facilitation for weight shifting;Tactile cues for posture Stand Pivot Transfer Details (indicate cue type and reason): requires BUE support to complete at CGA/ Min A and vc for controlled descent to sit Squat Pivot Transfers: Contact Guard/Touching assist Lateral/Scoot Transfers: Independent with assistive device Transfer (Assistive device): Bilateral platform walker Locomotion  Gait Ambulation: Yes Gait Assistance: Moderate Assistance - Patient 50-74%;Minimal Assistance - Patient > 75% Gait Distance (Feet): 50 Feet Assistive  device: Bilateral platform walker Gait Assistance Details: Tactile cues for posture;Verbal cues for technique;Verbal cues for precautions/safety;Verbal cues for gait pattern;Verbal cues for safe use of DME/AE Gait Assistance Details: Min A to  patient for countering Pusher's and intermittent Min A for PFRW control Gait Gait: Yes Gait Pattern: Impaired Gait Pattern: Ataxic;Decreased hip/knee flexion - right;Decreased weight shift to right;Left flexed knee in stance;Narrow base of support Gait velocity: slow for age and due to decreased coordination Stairs / Additional Locomotion Stairs: Yes Stairs Assistance: Minimal Assistance - Patient > 75% Stair Management Technique: Two rails Number of Stairs: 1 Height of Stairs: 6 Product manager Mobility: Yes Wheelchair Assistance: Set up Lexicographer: Both upper extremities;Both lower extermities Wheelchair Parts Management: Supervision/cueing Distance: >224ft  Trunk/Postural Assessment  Cervical Assessment Cervical Assessment: Exceptions to [facility] (cervical kyphosis) Thoracic Assessment Thoracic Assessment: Exceptions to [facility] (thoracic kyphosis) Lumbar Assessment Lumbar Assessment: Exceptions to [facility] (posterior pelvic tilt) Postural Control Postural Control: Deficits on evaluation Trunk Control: WFL when rested; L lean increases with fatigue level  Balance Balance Balance Assessed: Yes Standardized Balance Assessment Standardized Balance Assessment:  (FIST) Static Sitting Balance Static Sitting - Balance Support: Feet supported Static Sitting - Level of Assistance: 5: Stand by assistance Static Sitting - Comment/# of Minutes: >20 minutes Dynamic Sitting Balance Dynamic Sitting - Balance Support: Feet supported;Right upper extremity supported Dynamic Sitting - Level of Assistance: 5: Stand by assistance Dynamic Sitting - Balance Activities: Lateral lean/weight shifting;Forward lean/weight shifting;Reaching for  objects;Reaching across midline;Reaching for weighted objects;Trunk control activities Sitting balance - Comments: resting posture is midline with forward flexion throughout thoracic spine Static Standing Balance Static Standing - Balance Support: Bilateral upper extremity supported Static Standing - Level of Assistance: 5: Stand by assistance;4: Min assist Dynamic Standing Balance Dynamic Standing - Balance Support: Bilateral upper extremity supported Dynamic Standing - Level of Assistance: 3: Mod assist Dynamic Standing - Balance Activities: Lateral lean/weight shifting Dynamic Standing - Comments: has progressed to Mod A with BUE support Extremity Assessment  RUE Assessment RUE Assessment: Within Functional Limits Active Range of Motion (AROM) Comments: 3/4 to full shoulder flexion General Strength Comments: 4/5 UE strength, grip strength LUE Assessment LUE Assessment: Within Functional Limits Active Range of Motion (AROM) Comments: 3/4 to full shoulder flexion General Strength Comments: 4+/5 UE, grip strength RLE Assessment RLE Assessment: Exceptions to [facility] General Strength Comments: Hip grossly 4-/ 5, Knee ext 4+/ 5, knee flex 3+/ 5, ankle WFL LLE Assessment LLE Assessment: Exceptions to [facility] General Strength Comments: LLE grossly 4+/ 5    Alger Simons 02/01/2020, 4:54 PM

## 2020-02-01 NOTE — Discharge Instructions (Signed)
Inpatient Rehab Discharge Instructions  Alexander Byrd Discharge date and time: No discharge date for patient encounter.   Activities/Precautions/ Functional Status: Activity: activity as tolerated Diet: mechanical soft thin liquids Wound Care: Routine skin checks Functional status:  ___ No restrictions     ___ Walk up steps independently ___ 24/7 supervision/assistance   ___ Walk up steps with assistance ___ Intermittent supervision/assistance  ___ Bathe/dress independently ___ Walk with walker     _x__ Bathe/dress with assistance ___ Walk Independently    ___ Shower independently ___ Walk with assistance    ___ Shower with assistance ___ No alcohol     ___ Return to work/school ________  COMMUNITY REFERRALS UPON DISCHARGE:    Home Health:   PT     OT     ST   Aide                 Agency: [facility] Phone: [phone]  Medical Equipment/Items Ordered: Hospital Bed, Wheelchair, Agricultural consultant, Drop Arm Commode                                                 Agency/Supplier: [employer]   Special Instructions: PLEASE ALLOW 24-72 HOURS AFTER DISCHARGE FOR HOME HEALTH TO SCHEDULE  No driving smoking or alcohol   Continue aspirin 81 mg daily and Plavix 75 mg daily x3 months then aspirin alone   STROKE/TIA DISCHARGE INSTRUCTIONS SMOKING Cigarette smoking nearly doubles your risk of having a stroke & is the single most alterable risk factor  If you smoke or have smoked in the last 12 months, you are advised to quit smoking for your health.  Most of the excess cardiovascular risk related to smoking disappears within a year of stopping.  Ask you doctor about anti-smoking medications  [place] Quit Line: 1-800-QUIT NOW  Free Smoking Cessation Classes [phone]  CHOLESTEROL Know your levels; limit fat & cholesterol in your diet  Lipid Panel     Component Value Date/Time   CHOL 84 01/03/2020 0521   TRIG 68 01/03/2020 0521   HDL 28 (L) 01/03/2020 0521    CHOLHDL 3.0 01/03/2020 0521   VLDL 14 01/03/2020 0521   LDLCALC 42 01/03/2020 0521      Many patients benefit from treatment even if their cholesterol is at goal.  Goal: Total Cholesterol (CHOL) less than 160  Goal:  Triglycerides (TRIG) less than 150  Goal:  HDL greater than 40  Goal:  LDL (LDLCALC) less than 100   BLOOD PRESSURE American Stroke Association blood pressure target is less that 120/80 mm/Hg  Your discharge blood pressure is:  BP: (!) 147/88  Monitor your blood pressure  Limit your salt and alcohol intake  Many individuals will require more than one medication for high blood pressure  DIABETES (A1c is a blood sugar average for last 3 months) Goal HGBA1c is under 7% (HBGA1c is blood sugar average for last 3 months)  Diabetes: No known diagnosis of diabetes    Lab Results  Component Value Date   HGBA1C 5.9 (H) 01/03/2020     Your HGBA1c can be lowered with medications, healthy diet, and exercise.  Check your blood sugar as directed by your physician  Call your physician if you experience unexplained or low blood sugars.  PHYSICAL ACTIVITY/REHABILITATION Goal is 30 minutes at least 4 days per week  Activity: Increase activity slowly, Therapies: Physical Therapy: Home Health Return to work:   Activity decreases your risk of heart attack and stroke and makes your heart stronger.  It helps control your weight and blood pressure; helps you relax and can improve your mood.  Participate in a regular exercise program.  Talk with your doctor about the best form of exercise for you (dancing, walking, swimming, cycling).  DIET/WEIGHT Goal is to maintain a healthy weight  Your discharge diet is:  Diet Order            DIET - DYS 1 Room service appropriate? Yes; Fluid consistency: Honey Thick  Diet effective now                 liquids Your height is:  Height: 5\' 8"  (172.7 cm) Your current weight is: Weight: 69.7 kg Your Body Mass Index (BMI) is:  BMI  (Calculated): 23.37  Following the type of diet specifically designed for you will help prevent another stroke.  Your goal weight range is:    Your goal Body Mass Index (BMI) is 19-24.  Healthy food habits can help reduce 3 risk factors for stroke:  High cholesterol, hypertension, and excess weight.  RESOURCES Stroke/Support Group:  Call [phone]   STROKE EDUCATION PROVIDED/REVIEWED AND GIVEN TO PATIENT Stroke warning signs and symptoms How to activate emergency medical system (call 911). Medications prescribed at discharge. Need for follow-up after discharge. Personal risk factors for stroke. Pneumonia vaccine given:  Flu vaccine given:  My questions have been answered, the writing is legible, and I understand these instructions.  I will adhere to these goals & educational materials that have been provided to me after my discharge from the hospital.      My questions have been answered and I understand these instructions. I will adhere to these goals and the provided educational materials after my discharge from the hospital.  Patient/Caregiver Signature _______________________________ Date __________  Clinician Signature _______________________________________ Date __________  Please bring this form and your medication list with you to all your follow-up doctor's appointments.

## 2020-02-01 NOTE — Progress Notes (Signed)
Patient ID: Alexander Byrd, male   DOB: 1950/03/15, 70 y.o.   MRN: [id number]   SW has provided pt sister with contact information 2x. Tal Neer from [employer] informed SW she still has not received a phone call from family to schedule delivery. SW reached out to sister to provide this information again. Sister reported she was calling once she hung up with SW.   Lavera Guise, [place] [phone]

## 2020-02-01 NOTE — Progress Notes (Signed)
Occupational Therapy Discharge Summary  Patient Details  Name: Alexander Byrd MRN: [id number] Date of Birth: 1950-08-21  Today's Date: 02/01/2020 OT Individual Time: 0907-1003 OT Individual Time Calculation (min): 56 min    Patient has met 12 of 12 long term goals due to improved activity tolerance, improved balance, postural control, ability to compensate for deficits, functional use of  LEFT upper extremity, improved attention, improved awareness and improved coordination.  Patient to discharge at [facility] Assist level.  Patient's care partner is independent to provide the necessary physical and cognitive assistance at discharge.    Recommendation:  Patient will benefit from ongoing skilled OT services in home health setting to continue to advance functional skills in the area of BADL and Reduce care partner burden.  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 Restrictions Weight Bearing Restrictions: No Pain Pain Assessment Pain Scale: 0-10 Pain Score: 0-No pain ADL ADL Eating: Set up Grooming: Setup Upper Body Bathing: Minimal assistance Where Assessed-Upper Body Bathing: Bed level Lower Body Bathing: Minimal assistance Where Assessed-Lower Body Bathing: Bed level Upper Body Dressing: Supervision/safety Where Assessed-Upper Body Dressing: Bed level Lower Body Dressing: Minimal assistance Where Assessed-Lower Body Dressing: Bed level Toileting: Moderate assistance Where Assessed-Toileting: Glass blower/designer: Psychiatric nurse Method: Engineer, water: Drop arm Geophysical data processor: Environmental education officer Method: Education officer, environmental: Midwife Baseline Vision/History: No visual deficits Vision Assessment?: Yes Eye Alignment: Within Functional Limits Ocular Range of Motion: Within Functional Limits Alignment/Gaze Preference: Gaze left Tracking/Visual Pursuits: Decreased smoothness of horizontal tracking;Decreased smoothness of vertical tracking Saccades: Decreased speed of saccadic movement;Additional eye shifts occurred during testing Convergence: Impaired (comment) (L eye does not converge) Visual Fields: No apparent deficits Perception  Inattention/Neglect: Appears intact Praxis Praxis: Intact Cognition Overall Cognitive Status: Impaired/Different from baseline Arousal/Alertness: Awake/alert Orientation Level: Oriented to person;Oriented to place;Oriented to time;Oriented to situation Attention: Sustained Sustained Attention: Appears intact Safety/Judgment: Appears intact Sensation Sensation Light Touch: Appears Intact Hot/Cold: Appears Intact Proprioception: Appears Intact Stereognosis: Appears Intact Coordination Gross Motor Movements are Fluid and Coordinated: No Fine Motor Movements are Fluid and Coordinated: Yes Coordination and Movement Description: [facility] grossly WFL for ADL tasks, delayed iniatiation and slow gross motor movements overall during functional mobility Finger Nose Finger Test: improved to only mildly impaired requiring extra time Heel Shin Test: continued ataxic movements bilaterally with L>R Motor  Motor Motor: Abnormal postural alignment and control;Hemiplegia Motor - Discharge Observations: minimal weakness in RUE compared to LUE, kyphotic posture, mod L lean Mobility  Bed Mobility Bed Mobility: Rolling Right;Rolling Left;Right Sidelying to Sit;Supine to Sit;Sit to Supine;Scooting to [facility];Sitting - Scoot to [employer] of Bed Rolling Right: Independent with assistive device Rolling Left: Independent with assistive device Right Sidelying to Sit: Independent with assistive device Supine  to Sit: Independent with assistive device Sitting - Scoot to Edge of Bed: Independent with assistive device Sit to Supine: Independent with assistive device Sit to Sidelying Right: Minimal Assistance - Patient > 75% Scooting to [facility]: Independent with assistive device Transfers Sit to Stand: Contact Guard/Touching assist Stand to Sit: Contact Guard/Touching assist  Trunk/Postural Assessment  Cervical Assessment Cervical Assessment: Exceptions to [facility] (cervical kyphosis) Thoracic Assessment Thoracic Assessment: Exceptions to [facility] (thoracic kyphosis) Lumbar Assessment Lumbar Assessment: Exceptions to [facility] (posterior pelvic tilt)  Postural Control Postural Control: Deficits on evaluation Trunk Control: mod L lean during static/dynamic sitting/standing ADLs  Balance Balance Balance Assessed: Yes Static Sitting Balance Static Sitting - Balance Support: Feet supported Static Sitting - Level of Assistance: 4: Min assist Dynamic Sitting Balance Dynamic Sitting - Balance Support: Feet supported Dynamic Sitting - Level of Assistance: 4: Min assist Static Standing Balance Static Standing - Balance Support: Bilateral upper extremity supported Static Standing - Level of Assistance: 4: Min assist Dynamic Standing Balance Dynamic Standing - Balance Support: Bilateral upper extremity supported;During functional activity Dynamic Standing - Level of Assistance: 3: Mod assist Extremity/Trunk Assessment RUE Assessment RUE Assessment: Within Functional Limits Active Range of Motion (AROM) Comments: 3/4 to full shoulder flexion General Strength Comments: 4/5 UE strength, grip strength LUE Assessment LUE Assessment: Within Functional Limits Active Range of Motion (AROM) Comments: 3/4 to full shoulder flexion General Strength Comments: 4+/5 UE, grip strength Session Note:  Pt received semi-reclined in bed, agreeable to therapy. Completed DC reassessments as documented above. Pt complete side roll > R  with supervision, side lying > sit EOB with min A to lift trunk, maintains static sitting with intermittent min A 2/2 mod L lean. Donned pants seated EOB with overall min A during sit<>stand. Squat-pivot to w/c with min A to lift and pivot hips. Stand-pivot x2 to and from w/c with overall mod A 2/2 mod L lean in standing. Pt completed sit EOB > sup with overall S.  Pt left semi-reclined in bed with bed alarm engaged, call bell in reach, and all immediate needs met.    Volanda Napoleon, MS, OTR/L Clyda Greener OTR/L 02/01/2020, 4:39 PM

## 2020-02-01 NOTE — Progress Notes (Signed)
[place] PHYSICAL MEDICINE & REHABILITATION PROGRESS NOTE   Subjective/Complaints:  Pt aware of d/c in am , pt becomes more animated when discussing interests, [employer] show  ROS: Denies CP, SOB, N/V/D,Objective:   DG Swallowing Func-Speech Pathology  Result Date: 01/30/2020 Objective Swallowing Evaluation: Type of Study: MBS-Modified Barium Swallow Study  Patient Details Name: Alexander Byrd MRN: [id number] Date of Birth: 07/22/1950 Today's Date: 01/30/2020 Past Medical History: No past medical history on file. Past Surgical History: No past surgical history on file. HPI: Pt is a 70 y.o. 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 More confusion per family report as well. Patient is altered on arrival and unable to write any verbal history.  He is accompanied by his sister who states that typically his able to speak and only has weakness in his left side.  Unsure of pt's Baseline Cognitive-communication status or functional status for safe po intake, self-feeding as there were no notes in the chart in Care Everywhere from his recent CVA at "[facility]" ~3 weeks ago. Pt admitted to [facility] CIR 01/09/20.  Assessment / Plan / Recommendation CHL IP CLINICAL IMPRESSIONS 01/30/2020 Clinical Impression Although mild oropharyngeal dysphagia still evident, pt demonstrated functional improvement in swallow function today in comparison to last MBSS conducted on 01/14/20. Pt still exhibits premature spillage of liquid boluses into the pharynx, with PO filling the pyriform sinuses for 2-5 seconds prior to swallow initiation. Aspiration X2 and penetration to the level of the vocal folds X1 noted during self-fed cup sips of thin barium (PAS scores of 4, 6, and 7). However, when boluses  were administered via TSP, no penetration or aspiration was observed. Suspect this is due to smaller bolus size, as pt has difficulty regulating bolus size when drinking from a cup himself, despite cues. Pt did sense aspirates via a consistent throat clear or cough response, which was intermittently effective to clear aspirates/penetrates from vestibule after cup sips. Recommend pt upgrade to thin liquids, but he MUST CONSUME VIA TEASPOON OR PROVALE CUP ONLY to help control bolus size. SLP will introduce Provale cup during next available treatment session. Also recommend continue Dysphagia 3 (mechanical soft) solids and pills whole in applesauce, full supervision to ensure use of compensatory swallow strategies. SLP Visit Diagnosis Dysphagia, oropharyngeal phase (R13.12) Attention and concentration deficit following -- Frontal lobe and executive function deficit following -- Impact on safety and function Mild aspiration risk   CHL IP TREATMENT RECOMMENDATION 01/05/2020 Treatment Recommendations Therapy as outlined in treatment plan below   Prognosis 01/05/2020 Prognosis for Safe Diet Advancement Fair Barriers to Reach Goals Cognitive deficits;Time post onset;Severity of deficits;Language deficits;Behavior Barriers/Prognosis Comment -- CHL IP DIET RECOMMENDATION 01/30/2020 SLP Diet Recommendations Dysphagia 3 (Mech soft) solids;Thin liquid Liquid Administration via Spoon;Other (Comment) Medication Administration Whole meds with puree Compensations Minimize environmental distractions;Slow rate;Small sips/bites;Other (Comment) Postural Changes Remain semi-upright after after feeds/meals (Comment);Seated upright at 90 degrees   CHL IP OTHER RECOMMENDATIONS 01/30/2020 Recommended Consults -- Oral Care Recommendations Oral care BID Other Recommendations --   CHL IP FOLLOW UP RECOMMENDATIONS 01/05/2020 Follow up Recommendations Skilled Nursing facility   [facility] IP FREQUENCY AND DURATION 01/05/2020 Speech Therapy Frequency (ACUTE  ONLY) min 3x week Treatment Duration 2 weeks      CHL IP  ORAL PHASE 01/30/2020 Oral Phase Impaired Oral - Pudding Teaspoon -- Oral - Pudding Cup -- Oral - Honey Teaspoon -- Oral - Honey Cup NT Oral - Nectar Teaspoon NT Oral - Nectar Cup Decreased bolus cohesion;Premature spillage Oral - Nectar Straw -- Oral - Thin Teaspoon Decreased bolus cohesion;Premature spillage Oral - Thin Cup Decreased bolus cohesion;Premature spillage Oral - Thin Straw Decreased bolus cohesion;Premature spillage Oral - Puree WFL Oral - Mech Soft -- Oral - Regular -- Oral - Multi-Consistency -- Oral - Pill NT Oral Phase - Comment --  CHL IP PHARYNGEAL PHASE 01/30/2020 Pharyngeal Phase Impaired Pharyngeal- Pudding Teaspoon -- Pharyngeal -- Pharyngeal- Pudding Cup -- Pharyngeal -- Pharyngeal- Honey Teaspoon -- Pharyngeal -- Pharyngeal- Honey Cup NT Pharyngeal -- Pharyngeal- Nectar Teaspoon NT Pharyngeal -- Pharyngeal- Nectar Cup Delayed swallow initiation-pyriform sinuses Pharyngeal Material does not enter airway Pharyngeal- Nectar Straw -- Pharyngeal -- Pharyngeal- Thin Teaspoon Delayed swallow initiation-pyriform sinuses;Penetration/Aspiration during swallow Pharyngeal Material does not enter airway Pharyngeal- Thin Cup Delayed swallow initiation-pyriform sinuses;Penetration/Aspiration during swallow Pharyngeal Material enters airway, passes BELOW cords then ejected out;Material enters airway, CONTACTS cords and then ejected out Pharyngeal- Thin Straw Delayed swallow initiation-pyriform sinuses;Penetration/Aspiration during swallow Pharyngeal Material enters airway, passes BELOW cords and not ejected out despite cough attempt by patient Pharyngeal- Puree WFL Pharyngeal -- Pharyngeal- Mechanical Soft -- Pharyngeal -- Pharyngeal- Regular -- Pharyngeal -- Pharyngeal- Multi-consistency -- Pharyngeal -- Pharyngeal- Pill NT Pharyngeal -- Pharyngeal Comment --  CHL IP CERVICAL ESOPHAGEAL PHASE 01/30/2020 Cervical Esophageal Phase WFL Pudding Teaspoon --  Pudding Cup -- Honey Teaspoon -- Honey Cup -- Nectar Teaspoon -- Nectar Cup -- Nectar Straw -- Thin Teaspoon -- Thin Cup -- Thin Straw -- Puree -- Mechanical Soft -- Regular -- Multi-consistency -- Pill -- Cervical Esophageal Comment -- [name] 01/30/2020, 10:56 AM              No results for input(s): WBC, HGB, HCT, PLT in the last 72 hours. No results for input(s): NA, K, CL, CO2, GLUCOSE, BUN, CREATININE, CALCIUM in the last 72 hours.  Intake/Output Summary (Last 24 hours) at 02/01/2020 0741 Last data filed at 02/01/2020 0517 Gross per 24 hour  Intake 440 ml  Output 950 ml  Net -510 ml        Physical Exam: Vital Signs Blood pressure 118/80, pulse 86, temperature 98.6 F (37 C), resp. rate 16, height 5\' 7"  (1.702 m), weight 69.2 kg, SpO2 95 %.   General: No acute distress Mood and affect are appropriate Heart: Regular rate and rhythm no rubs murmurs or extra sounds Lungs: Clear to auscultation, breathing unlabored, no rales or wheezes Abdomen: Positive bowel sounds, soft nontender to palpation, nondistended Extremities: No clubbing, cyanosis, or edema Skin: No evidence of breakdown, no evidence of rash  Sensory exam normal sensation to light touch and proprioception in bilateral upper and lower extremities Cerebellar exam ataxia L>R finger to nose to finger Musculoskeletal: Full range of motion in all 4 extremities. No joint swelling  Psych: Blunted.  Flat.  Delayed.   Musc: No edema in extremities.  No tenderness in extremities. Neuro: Alert and oriented x1   Assessment/Plan: 1. Functional deficits which require 3+ hours per day of interdisciplinary therapy in a comprehensive inpatient rehab setting.  Physiatrist is providing close team supervision and 24 hour management of active medical problems listed below.  Physiatrist and rehab team continue to assess barriers to discharge/monitor patient progress toward functional and medical goals  Care Tool:  Bathing  Body  parts bathed by patient: Chest,Abdomen,Face,Right arm,Left arm,Right upper leg,Left upper leg,Right lower leg,Left lower leg,Front perineal area   Body parts bathed by helper: Buttocks Body parts n/a: Buttocks,Front perineal area (pt previously cleansed)   Bathing assist Assist Level: Minimal Assistance - Patient > 75%     Upper Body Dressing/Undressing Upper body dressing   What is the patient wearing?: Pull over shirt    Upper body assist Assist Level: Supervision/Verbal cueing    Lower Body Dressing/Undressing Lower body dressing      What is the patient wearing?: Pants,Incontinence brief     Lower body assist Assist for lower body dressing: Moderate Assistance - Patient 50 - 74% (sit to stand)     Toileting Toileting Toileting Activity did not occur (Clothing management and hygiene only): N/A (no void or bm)  Toileting assist Assist for toileting: Moderate Assistance - Patient 50 - 74%     Transfers Chair/bed transfer  Transfers assist  Chair/bed transfer activity did not occur: Safety/medical concerns  Chair/bed transfer assist level: Minimal Assistance - Patient > 75% (squat-pivot)     Locomotion Ambulation   Ambulation assist      Assist level: Moderate Assistance - Patient 50 - 74% Assistive device: Walker-platform Max distance: 45'   Walk 10 feet activity   Assist  Walk 10 feet activity did not occur: Safety/medical concerns  Assist level: Moderate Assistance - Patient - 50 - 74% Assistive device: Walker-platform   Walk 50 feet activity   Assist Walk 50 feet with 2 turns activity did not occur: Safety/medical concerns         Walk 150 feet activity   Assist Walk 150 feet activity did not occur: Safety/medical concerns         Walk 10 feet on uneven surface  activity   Assist Walk 10 feet on uneven surfaces activity did not occur: Safety/medical concerns         Wheelchair     Assist Will patient use wheelchair at  discharge?: Yes Type of Wheelchair: Manual    Wheelchair assist level: Set up assist Max wheelchair distance: >200'    Wheelchair 50 feet with 2 turns activity    Assist        Assist Level: Supervision/Verbal cueing   Wheelchair 150 feet activity     Assist      Assist Level: Supervision/Verbal cueing   Blood pressure 118/80, pulse 86, temperature 98.6 F (37 C), resp. rate 16, height 5\' 7"  (1.702 m), weight 69.2 kg, SpO2 95 %.  Medical Problem List and Plan: 1.  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  Continue CIR,PT,OT, SLP Tent D/C 1/8  2.  Antithrombotics: -DVT/anticoagulation: Lovenox             -antiplatelet therapy: Aspirin 81 mg daily and Plavix 75 mg daily x3 months then aspirin daily 3. Pain Management: Continue Tylenol as needed  RIght foot x-ray unremarkable  Controlled on 1/4 4. Mood: Provide emotional support             -antipsychotic agents: N/A 5. 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 Skin/Wound Care: Routine skin checks.  7. Fluids/Electrolytes/Nutrition: Routine and outs.  BMP normal 1/3 8.  Hypertension.  Continue Norvasc 5 mg daily.  Vitals:   01/31/20 1932 02/01/20 0507  BP: (!) 142/91 118/80  Pulse: 86 86  Resp: 18 16  Temp: 98.4 F (36.9 C) 98.6 F (37 C)  SpO2: 97% 95%   Controlled on 1/7  9.  Hyperlipidemia: Lipitor 10.  BPH.  Continue Flomax 0.4 mg daily, increased on 1/2.               PVRs x1 showing retention with incontinence 11.  Severe sepsis with acute enteritis.  Completed course of azithromycin.  Monitor hydration. 12.  AKI: resolved.  13.  Post stroke dysphagia: D3 nectar, advance diet as tolerated-d/c IV and enc po as discharge is <1wk 14. Nausea: continue zofran prn  15.  Constipation cont.  Senna  S  Improving 16. SInus Tachycardia:Mild  intermittent, continue to monitor.not due to anemia, no hypoxia, hydration status is good  17.  Mild/borderline normochromic normocytic anemia, asymptomatic  hgb 12.9 LOS: 23 days A FACE TO FACE EVALUATION WAS PERFORMED  Charlett Blake 02/01/2020, 7:41 AM

## 2020-02-01 NOTE — Progress Notes (Addendum)
Patient ID: Alexander Byrd, male   DOB: [birth year], 70 y.o.   MRN: [id number]   Patient declined by [facility] due to location. Patient referral sent to [facility] and [facility]  [place], [place] [phone]

## 2020-02-02 NOTE — Progress Notes (Signed)
[place] PHYSICAL MEDICINE & REHABILITATION PROGRESS NOTE   Subjective/Complaints:  No new issues. Waiting for dc today. Needs to go to br  ROS: Patient denies fever, rash, sore throat, blurred vision, nausea, vomiting, diarrhea, cough, shortness of breath or chest pain, joint or back pain, headache, or mood change.    Objective:   No results found. No results for input(s): WBC, HGB, HCT, PLT in the last 72 hours. No results for input(s): NA, K, CL, CO2, GLUCOSE, BUN, CREATININE, CALCIUM in the last 72 hours.  Intake/Output Summary (Last 24 hours) at 02/02/2020 0825 Last data filed at 02/01/2020 2043 Gross per 24 hour  Intake 240 ml  Output 275 ml  Net -35 ml        Physical Exam: Vital Signs Blood pressure 137/81, pulse 89, temperature 99 F (37.2 C), temperature source Oral, resp. rate 18, height 5\' 7"  (1.702 m), weight 69.2 kg, SpO2 97 %.   Constitutional: No distress . Vital signs reviewed. HEENT: EOMI, oral membranes moist Neck: supple Cardiovascular: RRR without murmur. No JVD    Respiratory/Chest: CTA Bilaterally without wheezes or rales. Normal effort    GI/Abdomen: BS +, non-tender, non-distended Ext: no clubbing, cyanosis, or edema Skin: No evidence of breakdown, no evidence of rash  Sensory exam normal sensation to light touch and proprioception in bilateral upper and lower extremities Cerebellar exam ataxia L>R finger to nose to finger Musculoskeletal: Full range of motion in all 4 extremities. No joint swelling  Psych: Blunted.  Flat.  Delayed.   Musc: No edema in extremities.  No tenderness in extremities. Neuro: Alert and oriented x1   Assessment/Plan: 1. Functional deficits which require 3+ hours per day of interdisciplinary therapy in a comprehensive inpatient rehab setting.  Physiatrist is providing close team supervision and 24 hour management of active medical problems listed below.  Physiatrist and rehab team continue to assess barriers to  discharge/monitor patient progress toward functional and medical goals  Care Tool:  Bathing    Body parts bathed by patient: Chest,Abdomen,Face,Right arm,Left arm,Right upper leg,Left upper leg,Right lower leg,Left lower leg,Front perineal area   Body parts bathed by helper: Buttocks Body parts n/a: Buttocks,Front perineal area (pt previously cleansed)   Bathing assist Assist Level: Minimal Assistance - Patient > 75%     Upper Body Dressing/Undressing Upper body dressing   What is the patient wearing?: Pull over shirt    Upper body assist Assist Level: Supervision/Verbal cueing    Lower Body Dressing/Undressing Lower body dressing      What is the patient wearing?: Pants     Lower body assist Assist for lower body dressing: Minimal Assistance - Patient > 75%     Toileting Toileting Toileting Activity did not occur (Clothing management and hygiene only): N/A (no void or bm)  Toileting assist Assist for toileting: Moderate Assistance - Patient 50 - 74%     Transfers Chair/bed transfer  Transfers assist  Chair/bed transfer activity did not occur: Safety/medical concerns  Chair/bed transfer assist level: Minimal Assistance - Patient > 75% (stand-pivot)     Locomotion Ambulation   Ambulation assist      Assist level: Minimal Assistance - Patient > 75% Assistive device: Walker-platform Max distance: 50'   Walk 10 feet activity   Assist  Walk 10 feet activity did not occur: Safety/medical concerns  Assist level: Minimal Assistance - Patient > 75% Assistive device: Walker-platform   Walk 50 feet activity   Assist Walk 50 feet with 2 turns activity did  not occur: Safety/medical concerns  Assist level: Moderate Assistance - Patient - 50 - 74% Assistive device: Walker-platform    Walk 150 feet activity   Assist Walk 150 feet activity did not occur: Safety/medical concerns         Walk 10 feet on uneven surface  activity   Assist Walk 10  feet on uneven surfaces activity did not occur: Safety/medical concerns         Wheelchair     Assist Will patient use wheelchair at discharge?: Yes Type of Wheelchair: Manual    Wheelchair assist level: Set up assist Max wheelchair distance: >200'    Wheelchair 50 feet with 2 turns activity    Assist        Assist Level: Independent   Wheelchair 150 feet activity     Assist      Assist Level: Independent   Blood pressure 137/81, pulse 89, temperature 99 F (37.2 C), temperature source Oral, resp. rate 18, height 5\' 7"  (1.702 m), weight 69.2 kg, SpO2 97 %.  Medical Problem List and Plan: 1.  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  Continue CIR,PT,OT, SLP    -Patient to see MD in the office for transitional care encounter in 1-2 weeks. DC home today  2.  Antithrombotics: -DVT/anticoagulation: Lovenox             -antiplatelet therapy: Aspirin 81 mg daily and Plavix 75 mg daily x3 months then aspirin daily 3. Pain Management: Continue Tylenol as needed  RIght foot x-ray unremarkable  Controlled on 1/4 4. Mood: Provide emotional support             -antipsychotic agents: N/A 5. 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 Skin/Wound Care: Routine skin checks.  7. Fluids/Electrolytes/Nutrition: Routine and outs.  BMP normal 1/3 8.  Hypertension.  Continue Norvasc 5 mg daily.                   Vitals:   02/01/20 1925 02/02/20 0450  BP: (!) 143/85 137/81  Pulse: 92 89  Resp: 18 18  Temp: 99 F (37.2 C)   SpO2: 96% 97%   Controlled on 1/8  9.  Hyperlipidemia: Lipitor 10.  BPH.  Continue Flomax 0.4 mg daily, increased on 1/2.               PVRs x1 showing retention with incontinence 11.  Severe sepsis with acute enteritis.  Completed course of azithromycin.  Monitor  hydration. 12.  AKI: resolved.  13.  Post stroke dysphagia: D3 nectar, advance diet as tolerated-d/c IV and enc po as discharge is <1wk 14. Nausea: continue zofran prn  15.  Constipation cont.  Senna S  Improving 16. SInus Tachycardia:Mild  intermittent, continue to monitor.not due to anemia, no hypoxia, hydration status is good   -HR controlled 17.  Mild/borderline normochromic normocytic anemia, asymptomatic  hgb 12.9 LOS: 24 days A FACE TO FACE EVALUATION WAS PERFORMED  3/8 02/02/2020, 8:25 AM

## 2020-02-02 NOTE — Progress Notes (Signed)
Patient discharged to home per wheelchair accompanied by NT and sister. No further questions noted.

## 2020-02-15 ENCOUNTER — Encounter: Payer: Medicare HMO | Admitting: Physical Medicine & Rehabilitation

## 2020-03-14 ENCOUNTER — Other Ambulatory Visit: Payer: Self-pay

## 2020-03-14 ENCOUNTER — Encounter: Payer: Medicare HMO | Attending: Physical Medicine & Rehabilitation | Admitting: Physical Medicine & Rehabilitation

## 2020-03-14 ENCOUNTER — Encounter: Payer: Self-pay | Admitting: Physical Medicine & Rehabilitation

## 2020-03-14 VITALS — BP 132/85 | HR 88 | Temp 98.7°F | Ht 67.0 in

## 2020-03-14 DIAGNOSIS — R269 Unspecified abnormalities of gait and mobility: Secondary | ICD-10-CM | POA: Diagnosis present

## 2020-03-14 DIAGNOSIS — I69398 Other sequelae of cerebral infarction: Secondary | ICD-10-CM | POA: Insufficient documentation

## 2020-03-14 DIAGNOSIS — I69393 Ataxia following cerebral infarction: Secondary | ICD-10-CM | POA: Insufficient documentation

## 2020-03-14 NOTE — Patient Instructions (Signed)
Stroke Prevention Some medical conditions and lifestyle choices can lead to a higher risk for a stroke. You can help to prevent a stroke by eating healthy foods and exercising. It also helps to not smoke and to manage any health problems you may have. How can this condition affect me? A stroke is an emergency. It should be treated right away. A stroke can lead to brain damage or threaten your life. There is a better chance of surviving and getting better after a stroke if you get medical help right away. What can increase my risk? The following medical conditions may increase your risk of a stroke:  Diseases of the heart and blood vessels (cardiovascular disease).  High blood pressure (hypertension).  Diabetes.  High cholesterol.  Sickle cell disease.  Problems with blood clotting.  Being very overweight.  Sleeping problems (obstructivesleep apnea). Other risk factors include:  Being older than age 60.  A history of blood clots, stroke, or mini-stroke (TIA).  Race, ethnic background, or a family history of stroke.  Smoking or using tobacco products.  Taking birth control pills, especially if you smoke.  Heavy alcohol and drug use.  Not being active. What actions can I take to prevent this? Manage your health conditions  High cholesterol. ? Eat a healthy diet. If this is not enough to manage your cholesterol, you may need to take medicines. ? Take medicines as told by your doctor.  High blood pressure. ? Try to keep your blood pressure below 130/80. ? If your blood pressure cannot be managed through a healthy diet and regular exercise, you may need to take medicines. ? Take medicines as told by your doctor. ? Ask your doctor if you should check your blood pressure at home. ? Have your blood pressure checked every year.  Diabetes. ? Eat a healthy diet and get regular exercise. If your blood sugar (glucose) cannot be managed through diet and exercise, you may need to  take medicines. ? Take medicines as told by your doctor.  Talk to your doctor about getting checked for sleeping problems. Signs of a problem can include: ? Snoring a lot. ? Feeling very tired.  Make sure that you manage any other conditions you have. Nutrition  Follow instructions from your doctor about what to eat or drink. You may be told to: ? Eat and drink fewer calories each day. ? Limit how much salt (sodium) you use to 1,500 milligrams (mg) each day. ? Use only healthy fats for cooking, such as olive oil, canola oil, and sunflower oil. ? Eat healthy foods. To do this:  Choose foods that are high in fiber. These include whole grains, and fresh fruits and vegetables.  Eat at least 5 servings of fruits and vegetables a day. Try to fill one-half of your plate with fruits and vegetables at each meal.  Choose low-fat (lean) proteins. These include low-fat cuts of meat, chicken without skin, fish, tofu, beans, and nuts.  Eat low-fat dairy products. ? Avoid foods that:  Are high in salt.  Have saturated fat.  Have trans fat.  Have cholesterol.  Are processed or pre-made. ? Count how many carbohydrates you eat and drink each day.   Lifestyle  If you drink alcohol: ? Limit how much you have to:  0-1 drink a day for women who are not pregnant.  0-2 drinks a day for men. ? Know how much alcohol is in your drink. In the [place] one drink equals one 12 oz bottle   of beer (355mL), one 5 oz glass of wine (148mL), or one 1 oz glass of hard liquor (44mL).  Do not smoke or use any products that have nicotine or tobacco. If you need help quitting, ask your doctor.  Avoid secondhand smoke.  Do not use drugs. Activity  Try to stay at a healthy weight.  Get at least 30 minutes of exercise on most days, such as: ? Fast walking. ? Biking. ? Swimming.   Medicines  Take over-the-counter and prescription medicines only as told by your doctor.  Avoid taking birth control pills.  Talk to your doctor about the risks of taking birth control pills if: ? You are over 35 years old. ? You smoke. ? You get very bad headaches. ? You have had a blood clot. Where to find more information  American Stroke Association: www.strokeassociation.org Get help right away if:  You or a loved one has any signs of a stroke. "BE FAST" is an easy way to remember the warning signs: ? B - Balance. Dizziness, sudden trouble walking, or loss of balance. ? E - Eyes. Trouble seeing or a change in how you see. ? F - Face. Sudden weakness or loss of feeling of the face. The face or eyelid may droop on one side. ? A - Arms. Weakness or loss of feeling in an arm. This happens all of a sudden and most often on one side of the body. ? S - Speech. Sudden trouble speaking, slurred speech, or trouble understanding what people say. ? T - Time. Time to call emergency services. Write down what time symptoms started.  You or a loved one has other signs of a stroke, such as: ? A sudden, very bad headache with no known cause. ? Feeling like you may vomit (nausea). ? Vomiting. ? A seizure. These symptoms may be an emergency. Get help right away. Call your local emergency services (911 in the [place]  Do not wait to see if the symptoms will go away.  Do not drive yourself to the hospital. Summary  You can help to prevent a stroke by eating healthy, exercising, and not smoking. It also helps to manage any health problems you have.  Do not smoke or use any products that contain nicotine or tobacco.  Get help right away if you or a loved one has any signs of a stroke. This information is not intended to replace advice given to you by your health care provider. Make sure you discuss any questions you have with your health care provider. Document Revised: 08/13/2019 Document Reviewed: 08/13/2019 Elsevier Patient Education  2021 Elsevier Inc.  

## 2020-03-14 NOTE — Progress Notes (Signed)
Subjective:    Patient ID: Alexander Byrd, male    DOB: 1950/05/28, 70 y.o.   MRN: [id number] 70 y.o. 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  He was initially admitted to [facility] after his most recent CVA discharge to skilled nursing facility where he developed abdominal pain nausea and vomiting.  Prior to CVA patient lives with spouse 1 level home ramped entrance was independent working full-time.  Presented to [facility] 12/31/2019.  EEG negative for seizure.  CT/MRI showed subtle increase diffusion abnormality involving the left pons middle cerebellar peduncle and adjacent left cerebellar hemisphere.  No other acute abnormality.  There is a few scattered remote lacunar infarcts about the left basal ganglia and corona radiata.  CT of abdomen pelvis showed findings suggestive of enteritis.  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  Short segment moderate stenosis involving the mid right M1 segment.  Echocardiogram with ejection fraction of 60 to 65% no wall motion abnormalities grade 1 diastolic dysfunction.  Follow-up MRI completed findings consistent with left PICA infarction advised continue aspirin and Plavix.  Admission chemistries unremarkable except glucose 169 creatinine 1.48 lipase 33 urinalysis negative urine culture no growth lactic acid 4.4 C. difficile negative.  Hospital course work-up for sepsis related to acute enteritis placed on azithromycin x3 days as well as gentle IV fluids.  Latest creatinine 1.07.  Currently maintained on dysphagia #1 honey thick liquid diet.  Subcutaneous Lovenox for DVT prophylaxis.  Due to patient's decrease in functional  mobility cognitive deficits he was admitted for a comprehensive rehab program  Admit date: 01/09/2020 Discharge date: 02/02/2020  HPI Patient stays with his younger sister who brought him to his visit today.  Overall has been doing well since discharge from the inpatient rehabilitation at [facility] rehab. Still using walker and some physical Assist for ambulation No falls  Needs help with shower transfers and  LE dressing Has seen PCP Dr Danella Sensing at [facility] primary care.  The patient lives in [place] and gets his medical care both at [facility] and [place].  Pain Inventory Average Pain 0 Pain Right Now 0 My pain is aching  LOCATION OF PAIN  Right Greater Toe  BOWEL Number of stools per week: 4-5 Oral laxative use Yes  Type of laxative Miralax Enema or suppository use No  History of colostomy No  Incontinent No   BLADDER Normal In and out cath, frequency n/a Able to self cath No  Bladder incontinence No  Frequent urination No  Leakage with coughing No  Difficulty starting stream No  Incomplete bladder emptying No    Mobility use a walker how many minutes can you walk? 2 mins ability to climb steps?  no do you drive?  no use a wheelchair needs help with transfers Do you have any goals in this area?  yes  Function retired I need assistance with the following:  dressing, bathing, toileting, meal prep, household duties and shopping Do you have any goals in this area?  yes  Neuro/Psych weakness trouble walking dizziness  Prior Studies Any changes since last visit?  no New Patient  Physicians involved in your care Any changes since last visit?  no New Patient  No family history on file. Social History   Socioeconomic History   Marital status: Single    Spouse name: Not on file   Number of children: Not on file   Years of education: Not on file   Highest education level: Not on file  Occupational History   Not on file  Tobacco Use   Smoking status:  Unknown If Ever Smoked   Smokeless tobacco: Never Used  Vaping Use   Vaping Use: Every day  Substance and Sexual Activity   Alcohol use: Not Currently   Drug use: Not Currently   Sexual activity: Not Currently  Other Topics Concern   Not on file  Social History Narrative   Not on file   Social Determinants of Health   Financial Resource Strain: Not on file  Food Insecurity: Not on file  Transportation Needs: Not on file  Physical Activity: Not on file  Stress: Not on file  Social Connections: Not on file   No past surgical history on file. No past medical history on file. There were no vitals taken for this visit.  Opioid Risk Score:   Fall Risk Score:  `1  Depression screen PHQ 2/9  No flowsheet data found. Review of Systems  Gastrointestinal: Positive for constipation.  Musculoskeletal: Positive for gait problem.  Neurological: Positive for dizziness and weakness.  All other systems reviewed and are negative.      Objective:   Physical Exam Constitutional:      Appearance: Normal appearance. He is normal weight.  HENT:     Head: Normocephalic and atraumatic.  Eyes:     Extraocular Movements: Extraocular movements intact.     Conjunctiva/sclera: Conjunctivae normal.     Pupils: Pupils are equal, round, and reactive to light.     Comments: Visual fields are intact confrontation testing extraocular muscles are intact  Neurological:     Mental Status: He is alert and oriented to person, place, and time.     Cranial Nerves: Dysarthria present. No facial asymmetry.     Coordination: Coordination abnormal. Finger-Nose-Finger Test abnormal.     Comments: Mild dysarthria Motor strength is 4+ bilateral deltoid, bicep, tricep, grip, hip flexor, knee extensor, ankle dorsiflexor and plantar flexor  Patient has moderate dysmetria left finger-nose-finger mild dysmetria right finger-nose-finger  Sensation is reported is equal to light touch bilateral upper and  lower limbs Gait not tested the patient does not have his walker with him today.   Psychiatric:        Mood and Affect: Mood normal.           Assessment & Plan:  #1.  History of left pontine and left cerebellar infarcts, primarily symptomatic from the left cerebellar infarct.  His main issue is left upper extremity ataxia as well as gait disorder/balance problems.  We will continue home health PT OT for this.  I will see the patient back in 6 weeks and determine whether outpatient PT OT will be needed.  Fortunately there are PT OT options in [place] which would reduce drive time for family. Discussed with patient and his sister agree with plan

## 2020-04-02 ENCOUNTER — Ambulatory Visit: Payer: Medicare HMO | Admitting: Neurology

## 2020-04-02 ENCOUNTER — Encounter: Payer: Self-pay | Admitting: Neurology

## 2020-04-02 VITALS — BP 138/83 | HR 81 | Ht 69.0 in | Wt 147.8 lb

## 2020-04-02 DIAGNOSIS — Z8673 Personal history of transient ischemic attack (TIA), and cerebral infarction without residual deficits: Secondary | ICD-10-CM

## 2020-04-02 DIAGNOSIS — I639 Cerebral infarction, unspecified: Secondary | ICD-10-CM

## 2020-04-02 DIAGNOSIS — I6930 Unspecified sequelae of cerebral infarction: Secondary | ICD-10-CM

## 2020-04-02 DIAGNOSIS — R260 Ataxic gait: Secondary | ICD-10-CM

## 2020-04-02 NOTE — Progress Notes (Signed)
[facility] [address] [place]. [place] [address] [phone]       OFFICE CONSULT NOTE  [name] Date of Birth:  April 11, 1950 Medical Record Number:  [id number]   Referring MD: Oneita Kras PA-C  Reason for Referral: Stroke HPI: [name] is a 70 year old African-American male seen today for initial office consultation visit.  History is obtained from the patient, his sister is accompanying him and review of electronic medical records as well as care everywhere.  I have reviewed pertinent imaging films and results in PACS. Patient has past medical history of hypertension hyperlipidemia.  He initially presented on 12/09/2019 to [facility] for sudden onset of hearing loss as well as nausea and vomiting.  CT scan of the head was unremarkable.  He was subsequently transferred to [facility] where he was hospitalize for 10 days for diagnosis of cerebellar stroke.  Patient was offered IV TPA but family declined it.  MRI showed left cerebellar infarct NIH stroke on admission was 5.  He was started on aspirin and Plavix and stroke work-up showed hemoglobin A1c of 5.7 and LDL cholesterol of 143 mg percent.  Amlodipine was started for hypertension Lipitor 80 mg for elevated lipids.  Hospital admission was complicated by episode of neurological worsening on 12/15/2019 for altered mental status and new increased left-sided weakness with left beating nystagmus.  CT scan did not show significant changes in encephalopathy work-up showed no evidence of metabolic derangements or underlying infection.  Patient was felt to be slightly hypoperfusing due to low blood pressure.  CT scan scan of the brain on 12/15/2019 showed evolving left cerebellar infarcts.  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  There is also multifocal stenosis noted in bilateral middle cerebral arteries.  Patient was transferred for rehabilitation to rehab facility however he got further neurologic could be worse after a few weeks and this time was admitted to [facility] an MRI scan of the brain on 01/01/2020 and repeat one on 01/03/2020 showed worsening infarcts in the left PICA as well as patchy infarcts in the brainstem as well.  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  Patient had significant dysphagia and ataxia.  He was transferred for inpatient rehab at [facility] made gradual improvement.  He is currently living at home with his sister and has a caregiver during the day.  He requires 1 person assist with a safety belt to get him to stand and walk with close supervision.  Needs help with feeding himself and dressing himself.  He cannot be left alone.  His speech and swallowing have improved but gait and balance have not.  He is tolerating aspirin and Plavix well without bruising or bleeding.  His blood pressure is well controlled and today it is 138/83.  He has not had any follow-up lipid profile checked.  Is tolerating Lipitor well without muscle aches and pains. ROS:   14 system review of systems is positive for dizziness, imbalance, gait difficulty, numbness, difficulty eating all other systems negative  PMH:  Past Medical History:  Diagnosis Date  . Stroke [facility])     Social History:  Social History   Socioeconomic  History  . Marital status: Single    Spouse name: Not on file  . Number of children: Not on file  . Years of education: Not on file  . Highest education level: Not on file  Occupational History  . Not on file  Tobacco Use  . Smoking status: Never Smoker  . Smokeless tobacco: Never Used  Vaping Use  .  Vaping Use: Never used  Substance and Sexual Activity  . Alcohol use: Not Currently  . Drug use: Not Currently  . Sexual activity: Not Currently  Other Topics Concern  . Not on file  Social History Narrative   Lives with sister and niece  Bea & Mardene Celeste   Left Handed   Drinks 1-2 cups caffeine daily   Social Determinants of Health   Financial Resource Strain: Not on file  Food Insecurity: Not on file  Transportation Needs: Not on file  Physical Activity: Not on file  Stress: Not on file  Social Connections: Not on file  Intimate Partner Violence: Not on file    Medications:   Current Outpatient Medications on File Prior to Visit  Medication Sig Dispense Refill  . acetaminophen (TYLENOL) 325 MG tablet Take 2 tablets (650 mg total) by mouth every 6 (six) hours as needed for mild pain (or Fever >/= 101).    Marland Kitchen amLODipine (NORVASC) 5 MG tablet Take 1 tablet (5 mg total) by mouth daily. 30 tablet 0  . aspirin 81 MG chewable tablet Chew 81 mg by mouth daily.    Marland Kitchen atorvastatin (LIPITOR) 80 MG tablet Take 1 tablet (80 mg total) by mouth daily at 6 PM. 30 tablet 0  . cromolyn (OPTICROM) 4 % ophthalmic solution 1 drop 4 (four) times daily.    Marland Kitchen loratadine (CLARITIN) 10 MG tablet Take 10 mg by mouth daily.    . polyethylene glycol (MIRALAX / GLYCOLAX) 17 g packet Take 17 g by mouth daily. MIX IN 4-8 OZ OF FLUID    . tamsulosin (FLOMAX) 0.4 MG CAPS capsule Take 1 capsule (0.4 mg total) by mouth daily after supper. 30 capsule 0   No current facility-administered medications on file prior to visit.    Allergies:  No Known Allergies  Physical Exam General: Frail malnourished looking elderly African-American male, seated, in no evident distress Head: head normocephalic and atraumatic.   Neck: supple with no carotid or supraclavicular bruits Cardiovascular: regular rate and rhythm, no murmurs Musculoskeletal: no deformity Skin:  no rash/petichiae Vascular:  Normal pulses all  extremities  Neurologic Exam Mental Status: Awake and fully alert. Oriented to place and time. Recent and remote memory intact. Attention span, concentration and fund of knowledge appropriate. Mood and affect appropriate.  Cranial Nerves: Fundoscopic exam reveals sharp disc margins. Pupils equal, briskly reactive to light. Extraocular movements full without nystagmus but saccadic dysmetria on right lateral gaze.. Visual fields full to confrontation. Hearing intact. Facial sensation intact.  Mild right nasolabial fold asymmetry., tongue, palate moves normally and symmetrically.  Motor: Normal bulk and tone. Normal strength in all tested extremity muscles. Sensory.: intact to touch , pinprick , position and vibratory sensation.  Coordination: Bilateral mild finger-to-nose dysmetria. Knee to heel coordination slow but accurate. Gait and Station: Deferred as patient is in a wheelchair and requires 1 person assist to walk with a walker which did not bring for today's visit.Marland Kitchen  Reflexes: 1+ and symmetric. Toes downgoing.   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  Vascular risk factors of hypertension, hyperlipidemia and intracranial occlusive disease.     PLAN: I had a long d/w patient, his sister and caregiver  about his recent cerebellar and brainstem strokes, risk for recurrent stroke/TIAs, personally independently reviewed imaging studies and stroke evaluation results and answered questions.Continue aspirin 81 mg daily but stop Plavix now as it has been more than 3 months since his stroke for secondary stroke prevention and maintain strict control of hypertension with blood pressure goal below 130/90, diabetes with hemoglobin A1c goal below 6.5% and lipids with LDL cholesterol goal below 70 mg/dL. I also advised the patient to eat a  healthy diet with plenty of whole grains, cereals, fruits and vegetables, exercise regularly and maintain ideal body weight .continue ongoing home physical and occupational therapy and when this is over I recommend outpatient therapy as well.  He was also advised fall prevention precautions and to use his walker at all times.  Followup in the future with my nurse practitioner Shanda Bumps in 3 months or call earlier if necessary.  Greater than 50% time during this 45-minute consultation visit was spent on counseling and coordination of care about his cerebral and brainstem strokes and discussion about stroke prevention and answering questions. Delia Heady, MD Note: This document was prepared with digital dictation and possible smart phrase technology. Any transcriptional errors that result from this process are unintentional.

## 2020-04-02 NOTE — Patient Instructions (Signed)
I had a long d/w patient, his sister and caregiver  about his recent cerebellar and brainstem strokes, risk for recurrent stroke/TIAs, personally independently reviewed imaging studies and stroke evaluation results and answered questions.Continue aspirin 81 mg daily but stop Plavix now as it has been more than 3 months since his stroke for secondary stroke prevention and maintain strict control of hypertension with blood pressure goal below 130/90, diabetes with hemoglobin A1c goal below 6.5% and lipids with LDL cholesterol goal below 70 mg/dL. I also advised the patient to eat a healthy diet with plenty of whole grains, cereals, fruits and vegetables, exercise regularly and maintain ideal body weight .continue ongoing home physical and occupational therapy and when this is over I recommend outpatient therapy as well.  He was also advised fall prevention precautions and to use his walker at all times.  Followup in the future with my nurse practitioner Shanda Bumps in 3 months or call earlier if necessary.  Stroke Prevention Some medical conditions and behaviors are associated with a higher chance of having a stroke. You can help prevent a stroke by making nutrition, lifestyle, and other changes, including managing any medical conditions you may have. What nutrition changes can be made?  Eat healthy foods. You can do this by: ? Choosing foods high in fiber, such as fresh fruits and vegetables and whole grains. ? Eating at least 5 or more servings of fruits and vegetables a day. Try to fill half of your plate at each meal with fruits and vegetables. ? Choosing lean protein foods, such as lean cuts of meat, poultry without skin, fish, tofu, beans, and nuts. ? Eating low-fat dairy products. ? Avoiding foods that are high in salt (sodium). This can help lower blood pressure. ? Avoiding foods that have saturated fat, trans fat, and cholesterol. This can help prevent high cholesterol. ? Avoiding processed and  premade foods.  Follow your health care provider's specific guidelines for losing weight, controlling high blood pressure (hypertension), lowering high cholesterol, and managing diabetes. These may include: ? Reducing your daily calorie intake. ? Limiting your daily sodium intake to 1,500 milligrams (mg). ? Using only healthy fats for cooking, such as olive oil, canola oil, or sunflower oil. ? Counting your daily carbohydrate intake.   What lifestyle changes can be made?  Maintain a healthy weight. Talk to your health care provider about your ideal weight.  Get at least 30 minutes of moderate physical activity at least 5 days a week. Moderate activity includes brisk walking, biking, and swimming.  Do not use any products that contain nicotine or tobacco, such as cigarettes and e-cigarettes. If you need help quitting, ask your health care provider. It may also be helpful to avoid exposure to secondhand smoke.  Limit alcohol intake to no more than 1 drink a day for nonpregnant women and 2 drinks a day for men. One drink equals 12 oz of beer, 5 oz of wine, or 1 oz of hard liquor.  Stop any illegal drug use.  Avoid taking birth control pills. Talk to your health care provider about the risks of taking birth control pills if: ? You are over 51 years old. ? You smoke. ? You get migraines. ? You have ever had a blood clot. What other changes can be made?  Manage your cholesterol levels. ? Eating a healthy diet is important for preventing high cholesterol. If cholesterol cannot be managed through diet alone, you may also need to take medicines. ? Take any prescribed medicines  to control your cholesterol as told by your health care provider.  Manage your diabetes. ? Eating a healthy diet and exercising regularly are important parts of managing your blood sugar. If your blood sugar cannot be managed through diet and exercise, you may need to take medicines. ? Take any prescribed medicines to  control your diabetes as told by your health care provider.  Control your hypertension. ? To reduce your risk of stroke, try to keep your blood pressure below 130/80. ? Eating a healthy diet and exercising regularly are an important part of controlling your blood pressure. If your blood pressure cannot be managed through diet and exercise, you may need to take medicines. ? Take any prescribed medicines to control hypertension as told by your health care provider. ? Ask your health care provider if you should monitor your blood pressure at home. ? Have your blood pressure checked every year, even if your blood pressure is normal. Blood pressure increases with age and some medical conditions.  Get evaluated for sleep disorders (sleep apnea). Talk to your health care provider about getting a sleep evaluation if you snore a lot or have excessive sleepiness.  Take over-the-counter and prescription medicines only as told by your health care provider. Aspirin or blood thinners (antiplatelets or anticoagulants) may be recommended to reduce your risk of forming blood clots that can lead to stroke.  Make sure that any other medical conditions you have, such as atrial fibrillation or atherosclerosis, are managed. What are the warning signs of a stroke? The warning signs of a stroke can be easily remembered as BEFAST.  B is for balance. Signs include: ? Dizziness. ? Loss of balance or coordination. ? Sudden trouble walking.  E is for eyes. Signs include: ? A sudden change in vision. ? Trouble seeing.  F is for face. Signs include: ? Sudden weakness or numbness of the face. ? The face or eyelid drooping to one side.  A is for arms. Signs include: ? Sudden weakness or numbness of the arm, usually on one side of the body.  S is for speech. Signs include: ? Trouble speaking (aphasia). ? Trouble understanding.  T is for time. ? These symptoms may represent a serious problem that is an emergency.  Do not wait to see if the symptoms will go away. Get medical help right away. Call your local emergency services (911 in the [place] Do not drive yourself to the hospital.  Other signs of stroke may include: ? A sudden, severe headache with no known cause. ? Nausea or vomiting. ? Seizure. Where to find more information For more information, visit:  American Stroke Association: www.strokeassociation.org  National Stroke Association: www.stroke.org Summary  You can prevent a stroke by eating healthy, exercising, not smoking, limiting alcohol intake, and managing any medical conditions you may have.  Do not use any products that contain nicotine or tobacco, such as cigarettes and e-cigarettes. If you need help quitting, ask your health care provider. It may also be helpful to avoid exposure to secondhand smoke.  Remember BEFAST for warning signs of stroke. Get help right away if you or a loved one has any of these signs. This information is not intended to replace advice given to you by your health care provider. Make sure you discuss any questions you have with your health care provider. Document Revised: 12/24/2016 Document Reviewed: 02/17/2016 Elsevier Patient Education  2021 [employer].

## 2020-04-25 ENCOUNTER — Encounter: Payer: Self-pay | Admitting: Physical Medicine & Rehabilitation

## 2020-04-25 ENCOUNTER — Other Ambulatory Visit: Payer: Self-pay

## 2020-04-25 ENCOUNTER — Encounter: Payer: Medicare HMO | Attending: Physical Medicine & Rehabilitation | Admitting: Physical Medicine & Rehabilitation

## 2020-04-25 VITALS — BP 137/89 | HR 81 | Temp 98.8°F | Ht 69.0 in | Wt 147.0 lb

## 2020-04-25 DIAGNOSIS — R269 Unspecified abnormalities of gait and mobility: Secondary | ICD-10-CM | POA: Insufficient documentation

## 2020-04-25 DIAGNOSIS — I69398 Other sequelae of cerebral infarction: Secondary | ICD-10-CM | POA: Insufficient documentation

## 2020-04-25 DIAGNOSIS — I69393 Ataxia following cerebral infarction: Secondary | ICD-10-CM | POA: Insufficient documentation

## 2020-04-25 NOTE — Progress Notes (Signed)
Subjective:    Patient ID: Alexander Byrd, male    DOB: 1950/11/10, 70 y.o.   MRN: [id number] Patient ID: Alexander Byrd, male    DOB: [birth year], 70 y.o.   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 He was initially admitted to [facility] after his most recent CVA discharge to skilled nursing facility where he developed abdominal pain nausea and vomiting. Prior to CVA patient lives with spouse 1 level home ramped entrance was independent working full-time. [facility]ed [facility]/31/2019. EEG negative for seizure. CT/MRI showed subtle increase diffusion abnormality involving the left pons middle cerebellar peduncle and adjacent left cerebellar hemisphere. No other acute abnormality. There is a few scattered remote lacunar infarcts about the left basal ganglia and corona radiata. CT of abdomen pelvis showed findings suggestive of enteritis. 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 Short segment moderate stenosis involving the mid right M1 segment. Echocardiogram with ejection fraction of 60 to 65% no wall motion abnormalities grade 1 diastolic dysfunction. Follow-up MRI completed findings consistent with left PICA infarction advised continue aspirin and Plavix. Admission chemistries unremarkable except glucose 169 creatinine 1.48 lipase 33 urinalysis negative urine culture no growth lactic acid 4.4 C. difficile negative. Hospital course work-up for sepsis related to acute enteritis placed on azithromycin x3 days as well as gentle IV fluids. Latest creatinine 1.07. Currently maintained on dysphagia #1 honey thick liquid diet.  Subcutaneous Lovenox for DVT prophylaxis. Due to patient's decrease in functional mobility cognitive deficits he was admitted for a comprehensive rehab program  Admit date:01/09/2020 Discharge date:02/02/2020 HPI  HHPT , OT re eval next week, no longer receiving speech therapy  Able to dress himself but needs some assist with bathing which he receives from family members  No falls, amb in home with walker, no longer needs gait belt, also walking 2-3 x per day Patient feels more secure crouching while standing or ambulating.  Working on trying to stay up straighter with physical therapy.  Pain Inventory Average Pain 0 Pain Right Now 0 My pain is intermittent  LOCATION OF PAIN  Shoulder pain  Increases after physical therapy  BOWEL Number of stools per week: 5 Oral laxative use Yes  Type of laxative miralax Enema or suppository use No  History of colostomy No  Incontinent No   BLADDER Normal In and out cath, frequency n/a Able to self cath n/a Bladder incontinence No  Frequent urination No  Leakage with coughing No  Difficulty starting stream No  Incomplete bladder emptying No    Mobility walk with assistance use a walker how many minutes can you walk? 10 ability to climb steps?  no do you drive?  no use a wheelchair needs help with transfers Do you have any goals in this area?  yes  Function retired I need assistance with the following:  dressing, bathing, toileting, meal prep, household duties and shopping Do you have any goals in this area?  no  Neuro/Psych weakness trouble walking  Prior Studies Any changes since last visit?  no  Physicians involved in your care Any changes since last visit?  no   History reviewed. No pertinent family history. Social History   Socioeconomic History  .  Marital status: Single    Spouse name: Not on file  . Number of children: Not on file  . Years of education: Not on file  . Highest education level: Not  on file  Occupational History  . Not on file  Tobacco Use  . Smoking status: Never Smoker  . Smokeless tobacco: Never Used  Vaping Use  . Vaping Use: Never used  Substance and Sexual Activity  . Alcohol use: Not Currently  . Drug use: Not Currently  . Sexual activity: Not Currently  Other Topics Concern  . Not on file  Social History Narrative   Lives with sister and niece  Bea & Mardene Celeste   Left Handed   Drinks 1-2 cups caffeine daily   Social Determinants of Health   Financial Resource Strain: Not on file  Food Insecurity: Not on file  Transportation Needs: Not on file  Physical Activity: Not on file  Stress: Not on file  Social Connections: Not on file   History reviewed. No pertinent surgical history. Past Medical History:  Diagnosis Date  . Stroke [facility])    There were no vitals taken for this visit.  Opioid Risk Score:   Fall Risk Score:  `1  Depression screen PHQ 2/9  Depression screen PHQ 2/9 03/14/2020  Decreased Interest 0  Down, Depressed, Hopeless 0  PHQ - 2 Score 0  Altered sleeping 0  Tired, decreased energy 0  Change in appetite 0  Feeling bad or failure about yourself  0  Trouble concentrating 0  Moving slowly or fidgety/restless 0  Suicidal thoughts 0  PHQ-9 Score 0    Review of Systems  Constitutional: Negative.   HENT: Negative.   Eyes: Negative.   Respiratory: Negative.   Cardiovascular: Negative.   Gastrointestinal: Positive for constipation.  Endocrine: Negative.   Genitourinary: Negative.   Musculoskeletal: Positive for arthralgias and gait problem.  Skin: Negative.   Allergic/Immunologic: Negative.   Neurological: Positive for weakness.  Hematological: Negative.   Psychiatric/Behavioral: Negative.   All other systems reviewed and are negative.      Objective:   Physical Exam Vitals and nursing note reviewed.  Constitutional:      Appearance: He is normal weight.  HENT:     Head: Normocephalic and atraumatic.  Eyes:      Extraocular Movements: Extraocular movements intact.     Conjunctiva/sclera: Conjunctivae normal.     Pupils: Pupils are equal, round, and reactive to light.  Musculoskeletal:     Comments: No pain with upper extremity or lower extremity range of motion no joint swelling noted  Skin:    General: Skin is warm and dry.  Neurological:     Mental Status: He is alert and oriented to person, place, and time.     Cranial Nerves: No dysarthria or facial asymmetry.     Coordination: Romberg sign positive. Coordination abnormal.     Gait: Gait abnormal.     Comments: Motor strength is 4/5 in the right deltoid, bicep, tricep, grip, hip flexor, knee extensor, ankle dorsiflexor plantar flexor 5/5 in left deltoid, bicep, tricep, grip, hip flexor, knee extensor, ankle dorsiflexor plantar flexor Cerebellar: Positive dysmetria bilateral finger-nose-finger and bilateral heel-to-shin  Patient stands with hand-held assist with a crouched posture           Assessment & Plan:  #1.  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  He will finish out home health PT OT and then transition to outpatient therapy. Patient will follow up with neurology as well as primary care Physical medicine rehab follow-up in 6 to 8 weeks

## 2020-04-25 NOTE — Patient Instructions (Signed)
Please bring walker to next visit

## 2020-06-06 ENCOUNTER — Encounter: Payer: Medicare HMO | Attending: Physical Medicine & Rehabilitation | Admitting: Physical Medicine & Rehabilitation

## 2020-06-06 ENCOUNTER — Encounter: Payer: Self-pay | Admitting: Physical Medicine & Rehabilitation

## 2020-06-06 ENCOUNTER — Other Ambulatory Visit: Payer: Self-pay

## 2020-06-06 VITALS — BP 147/84 | HR 83 | Temp 98.7°F | Ht 69.0 in | Wt 150.6 lb

## 2020-06-06 DIAGNOSIS — I10 Essential (primary) hypertension: Secondary | ICD-10-CM | POA: Diagnosis present

## 2020-06-06 DIAGNOSIS — I69393 Ataxia following cerebral infarction: Secondary | ICD-10-CM | POA: Diagnosis present

## 2020-06-06 NOTE — Progress Notes (Signed)
Subjective:    Patient ID: Alexander Byrd, male    DOB: 12/14/1950, 70 y.o.   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 He was initially admitted to [facility] after his most recent CVA discharge to skilled nursing facility where he developed abdominal pain nausea and vomiting. Prior to CVA patient lives with spouse 1 level home ramped entrance was independent working full-time. Presented to [facility] 12/31/2019. EEG negative for seizure. CT/MRI showed subtle increase diffusion abnormality involving the left pons middle cerebellar peduncle and adjacent left cerebellar hemisphere. No other acute abnormality. There is a few scattered remote lacunar infarcts about the left basal ganglia and corona radiata. CT of abdomen pelvis showed findings suggestive of enteritis. 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 Short segment moderate stenosis involving the mid right M1 segment. Echocardiogram with ejection fraction of 60 to 65% no wall motion abnormalities grade 1 diastolic dysfunction. Follow-up MRI completed findings consistent with left PICA infarction advised continue aspirin and Plavix. Admission chemistries unremarkable except glucose 169 creatinine 1.48 lipase 33 urinalysis negative urine culture no growth lactic acid 4.4 C. difficile negative. Hospital course work-up for sepsis related to acute enteritis placed on azithromycin x3 days as well as gentle IV fluids. Latest creatinine 1.07. Current HPI   Completed speech therapy and OT last week.  Receiving PT 2x per week x 4 more weeks to increase endurance. No falls  Dressing  independantly,  Bathes independantly but needs some supervision for transfers Ambulates with walker in home    Requires help with meals.    Still has 24 /7 supervision  Gets up at night without assist  Family pursuing some HHS service for Worker I filled out form for him today.  He is still receiving physical therapy, as discussed with patient and his aunt as well as family he does not have any medical appliances or devices including G-tube or trach or colostomy.  He certainly not able to live totally independently but that does not require much physical assistance  Pain Inventory  LOCATION OF PAIN no pain  BOWEL Number of stools per week:5 Oral laxative use Yes  Type of laxative miralax   BLADDER Normal    Mobility walk with assistance use a cane do you drive?  no use a wheelchair needs help with transfers  Function not employed: date last employed 11/2019 I need assistance with the following:  dressing, bathing, toileting, meal prep, household duties and shopping  Neuro/Psych trouble walking  Prior Studies Any changes since last visit?  no  Physicians involved in your care Any changes since last visit?  no   No family history on file. Social History   Socioeconomic History  . Marital status: Single    Spouse name: Not on file  . Number of children: Not on file  . Years of education: Not on file  . Highest education level: Not on file  Occupational History  . Not on file  Tobacco Use  . Smoking status: Never Smoker  . Smokeless tobacco: Never Used  Vaping Use  . Vaping Use: Never used  Substance and Sexual Activity  . Alcohol use: Not Currently  . Drug use: Not Currently  .  Sexual activity: Not Currently  Other Topics Concern  . Not on file  Social History Narrative   Lives with sister and niece  Alexander Byrd & Alexander Byrd   Left Handed   Drinks 1-2 cups caffeine daily   Social Determinants of Health   Financial Resource Strain: Not on file  Food  Insecurity: Not on file  Transportation Needs: Not on file  Physical Activity: Not on file  Stress: Not on file  Social Connections: Not on file   No past surgical history on file. Past Medical History:  Diagnosis Date  . Stroke (HCC)    BP (!) 147/84   Pulse 83   Temp 98.7 F (37.1 C)   Ht 5\' 9"  (1.753 m)   Wt 150 lb 9.6 oz (68.3 kg)   SpO2 94%   BMI 22.24 kg/m   Opioid Risk Score:   Fall Risk Score:  `1  Depression screen PHQ 2/9  Depression screen [facility] 2/9 06/06/2020 03/14/2020  Decreased Interest 0 0  Down, Depressed, Hopeless 0 0  PHQ - 2 Score 0 0  Altered sleeping - 0  Tired, decreased energy - 0  Change in appetite - 0  Feeling bad or failure about yourself  - 0  Trouble concentrating - 0  Moving slowly or fidgety/restless - 0  Suicidal thoughts - 0  PHQ-9 Score - 0    Review of Systems  Constitutional: Negative.   HENT: Negative.   Eyes: Negative.   Respiratory: Negative.   Cardiovascular: Negative.   Gastrointestinal: Negative.   Endocrine: Negative.   Genitourinary: Negative.   Musculoskeletal: Positive for gait problem.  Skin: Negative.   Allergic/Immunologic: Negative.   Hematological: Negative.   Psychiatric/Behavioral: Negative.   All other systems reviewed and are negative.      Objective:   Physical Exam Vitals reviewed.  Constitutional:      Appearance: He is normal weight.  HENT:     Head: Normocephalic and atraumatic.  Eyes:     Extraocular Movements: Extraocular movements intact.     Conjunctiva/sclera: Conjunctivae normal.     Pupils: Pupils are equal, round, and reactive to light.  Neurological:     Mental Status: He is alert and oriented to person, place, and time.     Comments: Left upper extremity moderate dysmetria finger-nose-finger there is limb ataxia left lower extremity as well noted during standing and taking steps. He ambulates with hand-held assist x2 wide-based support tends to lean toward the left.   Psychiatric:        Mood and Affect: Mood normal.        Behavior: Behavior normal.           Assessment & Plan:  1.  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  Continue home health PT Physical medicine rehab follow-up in 3 months

## 2020-07-24 ENCOUNTER — Ambulatory Visit (INDEPENDENT_AMBULATORY_CARE_PROVIDER_SITE_OTHER): Payer: Medicare HMO | Admitting: Adult Health

## 2020-07-24 ENCOUNTER — Encounter: Payer: Self-pay | Admitting: Adult Health

## 2020-07-24 VITALS — BP 150/88 | HR 88 | Ht 63.0 in | Wt 153.0 lb

## 2020-07-24 DIAGNOSIS — E785 Hyperlipidemia, unspecified: Secondary | ICD-10-CM | POA: Diagnosis not present

## 2020-07-24 DIAGNOSIS — R7309 Other abnormal glucose: Secondary | ICD-10-CM | POA: Diagnosis not present

## 2020-07-24 DIAGNOSIS — Z5181 Encounter for therapeutic drug level monitoring: Secondary | ICD-10-CM | POA: Diagnosis not present

## 2020-07-24 DIAGNOSIS — I639 Cerebral infarction, unspecified: Secondary | ICD-10-CM | POA: Diagnosis not present

## 2020-07-24 NOTE — Progress Notes (Signed)
[facility] [address] [place].  [address] [phone]       OFFICE FOLLOW UP NOTE  [name] Date of Birth:  Jul 20, 1950 Medical Record Number:  [id number]   Referring MD: Oneita Kras PA-C  Reason for Referral: Stroke  Chief Complaint  Patient presents with   Follow-up    RM14 with sister beatrice and niece Alexander Byrd  Pt is well and stable, no worsening or new symptoms.     HPI:   Today, 07/24/2020, Alexander Byrd returns for 71-month stroke follow-up accompanied by his sister and niece.  He reports he has been doing well since prior visit with some improvement of gait and right-sided coordination.  He continues to work with PT and is currently able to ambulate with rolling walker with supervision and denies any recent falls.  He continues to reside with his sister and is able to maintain ADLs independently majority of the time but will occasionally need help depending on energy level.  Denies residual speech or swallowing difficulties.  Denies new stroke/TIA symptoms.  Compliant on aspirin without associated side effects.  Atorvastatin has since been discontinued but per sister, unsure if discontinued by PCP and when or why it was discontinued.  They do not believe he was having any difficulty tolerating.  Blood pressure today 150/88.  Routinely monitors at home and typically stable.  No new concerns at this time.    History provided for reference purposes only Consult visit 04/02/2020 Dr. Pearlean Brownie: Alexander Byrd is a 70 year old African-American male seen today for initial office consultation visit.  History is obtained from the patient, his sister is accompanying him and review of electronic medical records as well as care everywhere.  I have reviewed pertinent imaging films and results in PACS. Patient has past medical history of hypertension hyperlipidemia.  He initially presented on 12/09/2019 to [facility] for sudden onset of hearing loss as  well as nausea and vomiting.  CT scan of the head was unremarkable.  He was subsequently transferred to [facility] where he was hospitalize for 10 days for diagnosis of cerebellar stroke.  Patient was offered IV TPA but family declined it.  MRI showed left cerebellar infarct NIH stroke on admission was 5.  He was started on aspirin and Plavix and stroke work-up showed hemoglobin A1c of 5.7 and LDL cholesterol of 143 mg percent.  Amlodipine was started for hypertension Lipitor 80 mg for elevated lipids.  Hospital admission was complicated by episode of neurological worsening on 12/15/2019 for altered mental status and new increased left-sided weakness with left beating nystagmus.  CT scan did not show significant changes in encephalopathy work-up showed no evidence of metabolic derangements or underlying infection.  Patient was felt to be slightly hypoperfusing due to low blood pressure.  CT scan scan of the brain on 12/15/2019 showed evolving left cerebellar infarcts.  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  There is also multifocal stenosis noted in bilateral middle cerebral arteries.  Patient was transferred for rehabilitation to rehab facility however he got further neurologic could be worse after a few weeks and this time was admitted to [facility] an MRI scan of the brain on 01/01/2020 and repeat one on 01/03/2020 showed worsening infarcts in the left PICA as well as patchy infarcts in  the brainstem as well.  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  Patient had significant dysphagia and ataxia.  He was transferred for inpatient rehab at [facility] made gradual improvement.  He is currently living at home with his sister and has a caregiver during the day.  He requires 1 person assist with a safety belt to get him to stand and walk with close supervision.  Needs help with feeding himself and dressing himself.  He cannot be left alone.  His speech and swallowing have improved but gait and balance have not.  He is tolerating aspirin and Plavix well without bruising or bleeding.  His blood pressure is well controlled and today it is 138/83.  He has not had any follow-up lipid profile checked.  Is tolerating Lipitor well without muscle aches and pains.   ROS:   14 system review of systems is positive for those listed in HPI and all other systems negative  PMH:  Past Medical History:  Diagnosis Date   Stroke [facility])     Social History:  Social History   Socioeconomic History   Marital status: Single    Spouse name: Not on file   Number of children: Not on file   Years of education: Not on file   Highest education level: Not on file  Occupational History   Not on file  Tobacco Use   Smoking status: Never   Smokeless tobacco: Never  Vaping Use   Vaping Use: Never used  Substance and Sexual Activity   Alcohol use: Not Currently   Drug use: Not Currently   Sexual activity: Not Currently  Other Topics Concern   Not on file  Social History Narrative   Lives with sister and niece  Alexander Byrd & Alexander Byrd   Left Handed   Drinks 1-2 cups caffeine daily   Social Determinants of Health   Financial Resource Strain: Not on file  Food Insecurity: Not on file  Transportation Needs: Not on file  Physical Activity: Not on file  Stress: Not on file  Social Connections: Not on file  Intimate Partner Violence: Not on file    Medications:   Current Outpatient Medications on File Prior to Visit  Medication Sig Dispense Refill   acetaminophen (TYLENOL) 325 MG tablet Take 2 tablets (650 mg total) by mouth every 6 (six) hours as needed for  mild pain (or Fever >/= 101).     amLODipine (NORVASC) 5 MG tablet TAKE 1 TABLET (5 MG TOTAL) BY MOUTH DAILY. 30 tablet 0   aspirin 81 MG chewable tablet Chew 81 mg by mouth daily.     cromolyn (OPTICROM) 4 % ophthalmic solution PLACE 2 DROPS INTO THE LEFT EYE FOUR TIMES DAILY FOR 15 DAYS. 0600, 1200, 1800, 0000 10 mL 12   loratadine (CLARITIN) 10 MG tablet Take 10 mg by mouth daily.     polyethylene glycol (MIRALAX / GLYCOLAX) 17 g packet Take 17 g by mouth daily. MIX IN 4-8 OZ OF FLUID     tamsulosin (FLOMAX) 0.4 MG CAPS capsule TAKE 1 CAPSULE (0.4 MG TOTAL) BY MOUTH DAILY AFTER SUPPER. 30 capsule 0   No current facility-administered medications on file prior to visit.    Allergies:  No Known Allergies  Physical Exam Today's Vitals   07/24/20 1400  BP: (!) 150/88  Pulse: 88  Weight: 153 lb (69.4 kg)  Height: 5\' 3"  (1.6 m)   Body mass index is 27.1 kg/m.  General: Frail very pleasant elderly African-American male, seated, in no evident distress Head: head normocephalic and atraumatic.   Neck: supple with no carotid or supraclavicular bruits Cardiovascular: regular rate and rhythm, no murmurs Musculoskeletal: no deformity Skin:  no rash/petichiae Vascular:  Normal pulses all extremities  Neurologic Exam Mental Status: Awake and fully alert. Oriented to place and time. Recent and remote memory intact. Attention span, concentration and fund of knowledge appropriate. Mood and affect appropriate.  Cranial Nerves: Pupils equal, briskly reactive to light. Extraocular movements full without nystagmus. Visual fields full to confrontation. Hearing intact. Facial sensation intact.  Mild right nasolabial fold asymmetry., tongue, palate moves normally and symmetrically.  Motor: Normal bulk and tone. Normal strength in all tested extremity muscles. Sensory.: intact to touch , pinprick , position and vibratory sensation.  Coordination: RUE mild finger-to-nose dysmetria. Knee to heel and  coordination R>L Gait and Station: Stands from seated position with mild difficulty.  Stance slightly hunched.  Gait demonstrates slow cautious steps and mild imbalance with use of rolling walker.  Tandem walk and heel toe not attempted due to safety issues. Reflexes: 1+ and symmetric. Toes downgoing.       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  Vascular risk factors of hypertension, hyperlipidemia and intracranial occlusive disease.     PLAN:  -Continue to participate in therapies and use of rolling walker at all times unless otherwise instructed -Continue aspirin 81 mg daily for secondary stroke prevention -Recommended restarting atorvastatin 80 mg daily for secondary stroke prevention and intracranial occlusive disease -sister plans on following up with PCP to further discuss -Obtain lipid panel and A1c today  Follow-up in 4 months or call earlier if needed  CC:  [facility] provider: January 2022., MD   I spent 32 minutes of face-to-face and non-face-to-face time with patient, sister and niece.  This included previsit chart review, lab review, study review, order entry, electronic health record documentation, and patient and family education and discussion regarding history of prior stroke and secondary stroke prevention measures and aggressive stroke risk factor management, residual deficits and possible further recovery and answered all other questions to patient and family satisfaction  Collier Bullock, [facility]  [facility] [address] [place], [place]  Phone [phone] Fax [phone] Note: This document was prepared with digital dictation and possible smart phrase technology. Any transcriptional errors that result from this process are unintentional.

## 2020-07-24 NOTE — Patient Instructions (Signed)
Continue working with physical therapy for hopeful ongoing recovery - keep up the good work!!  Continue aspirin 81 mg daily for secondary stroke prevention.  Would highly recommend that you restart atorvastatin which can be further discussed with your primary care provider  We will check lab work today and you should have the results in your MyChart by tomorrow -I will be in contact with you by Tuesday if there are any concerning findings  Continue to follow up with PCP regarding cholesterol and blood pressure management  Maintain strict control of hypertension with blood pressure goal below 130/90, diabetes with hemoglobin A1c goal below 6.5% and cholesterol with LDL cholesterol (bad cholesterol) goal below 70 mg/dL.       Followup in the future with me in 4 months or call earlier if needed       Thank you for coming to see [place] at [facility]. I hope we have been able to provide you high quality care today.  You may receive a patient satisfaction survey over the next few weeks. We would appreciate your feedback and comments so that we may continue to improve ourselves and the health of our patients.

## 2020-07-25 LAB — COMPREHENSIVE METABOLIC PANEL
ALT: 9 IU/L (ref 0–44)
AST: 12 IU/L (ref 0–40)
Albumin/Globulin Ratio: 1.4 (ref 1.2–2.2)
Albumin: 4.3 g/dL (ref 3.8–4.8)
Alkaline Phosphatase: 96 IU/L (ref 44–121)
BUN/Creatinine Ratio: 10 (ref 10–24)
BUN: 11 mg/dL (ref 8–27)
Bilirubin Total: 0.9 mg/dL (ref 0.0–1.2)
CO2: 25 mmol/L (ref 20–29)
Calcium: 9.9 mg/dL (ref 8.6–10.2)
Chloride: 104 mmol/L (ref 96–106)
Creatinine, Ser: 1.12 mg/dL (ref 0.76–1.27)
Globulin, Total: 3 g/dL (ref 1.5–4.5)
Glucose: 92 mg/dL (ref 65–99)
Potassium: 3.7 mmol/L (ref 3.5–5.2)
Sodium: 142 mmol/L (ref 134–144)
Total Protein: 7.3 g/dL (ref 6.0–8.5)
eGFR: 71 mL/min/{1.73_m2} (ref >59)

## 2020-07-25 LAB — CBC
Hematocrit: 40.9 % (ref 37.5–51.0)
Hemoglobin: 14 g/dL (ref 13.0–17.7)
MCH: 30.7 pg (ref 26.6–33.0)
MCHC: 34.2 g/dL (ref 31.5–35.7)
MCV: 90 fL (ref 79–97)
Platelets: 202 10*3/uL (ref 150–450)
RBC: 4.56 x10E6/uL (ref 4.14–5.80)
RDW: 14.2 % (ref 11.6–15.4)
WBC: 4.8 10*3/uL (ref 3.4–10.8)

## 2020-07-25 LAB — HEMOGLOBIN A1C
Est. average glucose Bld gHb Est-mCnc: 111 mg/dL
Hgb A1c MFr Bld: 5.5 % (ref 4.8–5.6)

## 2020-07-25 LAB — LIPID PANEL
Chol/HDL Ratio: 2.3 ratio (ref 0.0–5.0)
Cholesterol, Total: 129 mg/dL (ref 100–199)
HDL: 57 mg/dL (ref >39)
LDL Chol Calc (NIH): 59 mg/dL (ref 0–99)
Triglycerides: 62 mg/dL (ref 0–149)
VLDL Cholesterol Cal: 13 mg/dL (ref 5–40)

## 2020-07-25 NOTE — Progress Notes (Signed)
I agree with the above plan 

## 2020-09-04 ENCOUNTER — Encounter: Payer: Self-pay | Admitting: Physical Medicine & Rehabilitation

## 2020-09-04 ENCOUNTER — Other Ambulatory Visit: Payer: Self-pay

## 2020-09-04 ENCOUNTER — Encounter: Payer: Medicare HMO | Attending: Physical Medicine & Rehabilitation | Admitting: Physical Medicine & Rehabilitation

## 2020-09-04 VITALS — BP 137/82 | HR 99 | Temp 99.4°F | Ht 63.0 in | Wt 155.4 lb

## 2020-09-04 DIAGNOSIS — I69393 Ataxia following cerebral infarction: Secondary | ICD-10-CM | POA: Insufficient documentation

## 2020-09-04 NOTE — Progress Notes (Signed)
Subjective:    Patient ID: Alexander Byrd, male    DOB: July 18, 1950, 70 y.o.   MRN: [id number]  HPI 70 y.o. 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  He was initially admitted to [facility] after his most recent CVA discharge to skilled nursing facility where he developed abdominal pain nausea and vomiting.  Prior to CVA patient lives with spouse 1 level home ramped entrance was independent working full-time.  Presented to [facility] 12/31/2019.  EEG negative for seizure.  CT/MRI showed subtle increase diffusion abnormality involving the left pons middle cerebellar peduncle and adjacent left cerebellar hemisphere.  No other acute abnormality.  There is a few scattered remote lacunar infarcts about the left basal ganglia and corona radiata.  CT of abdomen pelvis showed findings suggestive of enteritis.  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  Short segment moderate stenosis involving the mid right M1 segment.  Echocardiogram with ejection fraction of 60 to 65% no wall motion abnormalities grade 1 diastolic dysfunction.  Follow-up MRI completed findings consistent with left PICA infarction advised continue aspirin and Plavix.  Admission chemistries unremarkable except glucose 169 creatinine 1.48 lipase 33 urinalysis negative urine culture no growth lactic acid 4.4 C. difficile negative.  Hospital course work-up for sepsis related to acute enteritis placed on azithromycin x3 days as well as gentle IV fluids.    Still need min A for bathing and CGA for dressing  No falls , still using walker to ambulate  Lives with wife who does all cooking.  His niece does some yard  work Pain Inventory Average Pain 3 Pain Right Now 2 My pain is intermittent and dull  LOCATION OF PAIN  right shoulder & right leg  BOWEL Number of stools per week: 5  Oral laxative use No  Type of laxative Miralax Enema or suppository use No  History of colostomy No  Incontinent No   BLADDER Normal In and out cath, frequency n/a Able to self cath No  Bladder incontinence No  Frequent urination No  Leakage with coughing No  Difficulty starting stream No  Incomplete bladder emptying No    Mobility use a walker ability to climb steps?  yes do you drive?  no use a wheelchair transfers alone Do you have any goals in this area?  yes  Function not employed: date last employed 11/2019 I need assistance with the following:  feeding, dressing, bathing, toileting, meal prep, household duties, and shopping Do you have any goals in this area?  yes  Neuro/Psych No problems in this area  Prior Studies Any changes since last visit?  no  Physicians involved in your care Any changes since last visit?  no   History reviewed. No pertinent family history. Social History   Socioeconomic History   Marital status: Single    Spouse name: Not on file   Number of children: Not on file   Years of education: Not on file   Highest education level: Not on file  Occupational History   Not on file  Tobacco Use   Smoking status: Never   Smokeless tobacco: Never  Vaping Use   Vaping Use: Never used  Substance and Sexual Activity  Alcohol use: Not Currently   Drug use: Not Currently   Sexual activity: Not Currently  Other Topics Concern   Not on file  Social History Narrative   Lives with sister and niece  Bea & Mardene Celeste   Left Handed   Drinks 1-2 cups caffeine daily   Social Determinants of Health   Financial Resource Strain: Not on file  Food Insecurity: Not on file  Transportation Needs: Not on file  Physical Activity: Not on file  Stress: Not on file  Social  Connections: Not on file   History reviewed. No pertinent surgical history. Past Medical History:  Diagnosis Date   Stroke (HCC)    BP (!) 147/84   Pulse 99   Temp 99.4 F (37.4 C)   Ht 5\' 3"  (1.6 m)   Wt 155 lb 6.4 oz (70.5 kg)   SpO2 95%   BMI 27.53 kg/m   Opioid Risk Score:   Fall Risk Score:  `1  Depression screen PHQ 2/9  Depression screen [facility] 2/9 09/04/2020 06/06/2020 03/14/2020  Decreased Interest 0 0 0  Down, Depressed, Hopeless 0 0 0  PHQ - 2 Score 0 0 0  Altered sleeping - - 0  Tired, decreased energy - - 0  Change in appetite - - 0  Feeling bad or failure about yourself  - - 0  Trouble concentrating - - 0  Moving slowly or fidgety/restless - - 0  Suicidal thoughts - - 0  PHQ-9 Score - - 0    Review of Systems  Gastrointestinal:  Positive for constipation.  Musculoskeletal:  Positive for gait problem.  Neurological:  Positive for weakness.  All other systems reviewed and are negative.     Objective:   Physical Exam Vitals and nursing note reviewed.  Constitutional:      Appearance: He is normal weight.  HENT:     Head: Normocephalic and atraumatic.  Eyes:     Extraocular Movements: Extraocular movements intact.     Conjunctiva/sclera: Conjunctivae normal.     Pupils: Pupils are equal, round, and reactive to light.  Musculoskeletal:     Right lower leg: No edema.     Left lower leg: No edema.     Comments: No pain with upper limb or lower limb range of motion  Skin:    General: Skin is warm and dry.  Neurological:     General: No focal deficit present.     Mental Status: He is alert and oriented to person, place, and time.  Psychiatric:        Mood and Affect: Mood normal.        Behavior: Behavior normal.    Motor strength is 4/5 bilateral deltoid, bicep, tricep grip, hip flexor, knee extensor, ankle dorsiflexor plantar flexion Tone is normal in bilateral upper limbs Mildly increased extensor tone in the lower extremities at the  quads. Ambulates with walker wide-based support no evidence of toe drag or knee instability tends to have a crouched gait.     Assessment & Plan:   1.  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  This is likely due to his crouched gait with incomplete quad relaxation muscle relaxers may be of some benefit however may cause drowsiness.  He continues to go to physical therapy and will talk to his therapist about this as well. Physical medicine  rehab follow-up on as-needed basis.

## 2020-11-27 ENCOUNTER — Encounter: Payer: Self-pay | Admitting: Adult Health

## 2020-11-27 ENCOUNTER — Other Ambulatory Visit: Payer: Self-pay

## 2020-11-27 ENCOUNTER — Ambulatory Visit (INDEPENDENT_AMBULATORY_CARE_PROVIDER_SITE_OTHER): Payer: Medicare HMO | Admitting: Adult Health

## 2020-11-27 VITALS — BP 122/86 | HR 98 | Ht 65.0 in | Wt 159.0 lb

## 2020-11-27 DIAGNOSIS — I639 Cerebral infarction, unspecified: Secondary | ICD-10-CM

## 2020-11-27 DIAGNOSIS — I10 Essential (primary) hypertension: Secondary | ICD-10-CM

## 2020-11-27 DIAGNOSIS — E785 Hyperlipidemia, unspecified: Secondary | ICD-10-CM | POA: Diagnosis not present

## 2020-11-27 DIAGNOSIS — R260 Ataxic gait: Secondary | ICD-10-CM | POA: Diagnosis not present

## 2020-11-27 NOTE — Progress Notes (Signed)
[facility] [address] [place]. [place] [address] [phone]       OFFICE FOLLOW UP NOTE  [name] Date of Birth:  [birth year] Medical Record Number:  [id number]   Referring MD: Oneita Kras PA-C  Reason for Referral: Stroke  Chief Complaint  Patient presents with   Follow-up    Rm 2 with sister [facility]) here for 4 month f/u- pt reports he has been doing well since his last visit. Sts he is still working with physical therapy. Denies any falls since last visit.       HPI:   Update 11/27/2020 JM: Returns for stroke follow-up visit accompanied by his sister.  Doing well since prior visit -denies new stroke/TIA symptoms. Continues to work with therapies for residual gait impairment and mild RUE weakness/incoordination.  Does report ongoing improvement since prior visit.  Continued use of RW at all times -denies any recent falls.  Compliant on aspirin and atorvastatin 80 mg daily -denies side effects Blood pressure today 122/86 -routinely monitors at home and typically stable  No new concerns at this time     History provided for reference purposes only Update 07/24/2020 JM: Alexander Byrd returns for 37-month stroke follow-up accompanied by his sister and niece.  He reports he has been doing well since prior visit with some improvement of gait and right-sided coordination.  He continues to work with PT and is currently able to ambulate with rolling walker with supervision and denies any recent falls.  He continues to reside with his sister and is able to maintain ADLs independently majority of the time but will occasionally need help depending on energy level.  Denies residual speech or swallowing difficulties.  Denies new stroke/TIA symptoms.  Compliant on aspirin without associated side effects.  Atorvastatin has since been discontinued but per sister, unsure if discontinued by PCP and when or why it was discontinued.  They do not believe he was having  any difficulty tolerating.  Blood pressure today 150/88.  Routinely monitors at home and typically stable.  No new concerns at this time.  Consult visit 04/02/2020 Dr. Pearlean Brownie: [name] is a 70 year old African-American male seen today for initial office consultation visit.  History is obtained from the patient, his sister is accompanying him and review of electronic medical records as well as care everywhere.  I have reviewed pertinent imaging films and results in PACS. Patient has past medical history of hypertension hyperlipidemia.  He initially presented on 12/09/2019 to [facility] for sudden onset of hearing loss as well as nausea and vomiting.  CT scan of the head was unremarkable.  He was subsequently transferred to [facility] where he was hospitalize for 10 days for diagnosis of cerebellar stroke.  Patient was offered IV TPA but family declined it.  MRI showed left cerebellar infarct NIH stroke on admission was 5.  He was started on aspirin and Plavix and stroke work-up showed hemoglobin A1c of 5.7 and LDL cholesterol of 143 mg percent.  Amlodipine was started for hypertension Lipitor 80 mg for elevated lipids.  Hospital admission was complicated by episode of neurological worsening on 12/15/2019 for altered mental status and new increased left-sided weakness with left beating nystagmus.  CT scan did not show significant changes in encephalopathy work-up showed no evidence of metabolic derangements or underlying infection.  Patient was felt to be slightly hypoperfusing due to low blood pressure.  CT scan scan of the brain on 12/15/2019 showed evolving left cerebellar  infarcts.  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  There is also multifocal stenosis noted in bilateral middle cerebral arteries.  Patient was  transferred for rehabilitation to rehab facility however he got further neurologic could be worse after a few weeks and this time was admitted to [facility] an MRI scan of the brain on 01/01/2020 and repeat one on 01/03/2020 showed worsening infarcts in the left PICA as well as patchy infarcts in the brainstem as well.  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  Patient had significant dysphagia and ataxia.  He was transferred for inpatient rehab at [facility] made gradual improvement.  He is currently living at home with his sister and has a caregiver during the day.  He requires 1 person assist with a safety belt to get him to stand and walk with close supervision.  Needs help with feeding himself and dressing himself.  He cannot be left alone.  His speech and swallowing have improved but gait and balance have not.  He is tolerating aspirin and Plavix well without bruising or bleeding.  His blood pressure is well controlled and today it is 138/83.  He has not had any follow-up lipid profile checked.  Is tolerating Lipitor well without muscle aches and pains.   ROS:   14 system review of systems is positive for those listed in HPI and all other systems negative  PMH:  Past Medical History:  Diagnosis Date   Stroke [facility])     Social History:  Social History   Socioeconomic History   Marital status: Single    Spouse name: Not on file   Number of children: Not on file   Years of education: Not on file   Highest education level: Not on file  Occupational History   Not on file  Tobacco Use   Smoking status: Never   Smokeless tobacco: Never  Vaping Use   Vaping Use: Never used  Substance and Sexual Activity   Alcohol use: Not Currently   Drug use: Not Currently   Sexual activity: Not Currently  Other Topics  Concern   Not on file  Social History Narrative   Lives with sister and niece  Bea & Joanna   Left Handed   Drinks 1-2 cups caffeine daily   Social Determinants of Health   Financial Resource Strain: Not on file  Food Insecurity: Not on file  Transportation Needs: Not on file  Physical Activity: Not on file  Stress: Not on file  Social Connections: Not on file  Intimate Partner Violence: Not on file    Medications:   Current Outpatient Medications on File Prior to Visit  Medication Sig Dispense Refill   acetaminophen (TYLENOL) 325 MG tablet Take 2 tablets (650 mg total) by mouth every 6 (six) hours as needed for mild pain (or Fever >/= 101).     amLODipine (NORVASC) 5 MG tablet TAKE 1 TABLET (5 MG TOTAL) BY MOUTH DAILY. 30 tablet 0   ASPIRIN PO Take 81 mg by mouth.     cromolyn (OPTICROM) 4 % ophthalmic solution Administer 1 drop to both eyes four (4) times a day.     fluticasone (FLONASE) 50 MCG/ACT  nasal spray Place 1 spray into both nostrils daily.     loratadine (CLARITIN) 10 MG tablet Take 10 mg by mouth daily.     polyethylene glycol (MIRALAX / GLYCOLAX) 17 g packet Take 17 g by mouth daily. MIX IN 4-8 OZ OF FLUID     tamsulosin (FLOMAX) 0.4 MG CAPS capsule Take 1 tablet by mouth daily.     No current facility-administered medications on file prior to visit.    Allergies:  No Known Allergies  Physical Exam Today's Vitals   11/27/20 1420  BP: 122/86  Pulse: 98  SpO2: 97%  Weight: 159 lb (72.1 kg)  Height: 5\' 5"  (1.651 m)   Body mass index is 26.46 kg/m.  General: Frail very pleasant elderly African-American male, seated, in no evident distress Head: head normocephalic and atraumatic.   Neck: supple with no carotid or supraclavicular bruits Cardiovascular: regular rate and rhythm, no murmurs Musculoskeletal: no deformity Skin:  no rash/petichiae Vascular:  Normal pulses all extremities  Neurologic Exam Mental Status: Awake and fully alert.  Fluent speech  and language.  Oriented to place and time. Recent and remote memory intact. Attention span, concentration and fund of knowledge appropriate. Mood and affect appropriate.  Cranial Nerves: Pupils equal, briskly reactive to light. Extraocular movements full without nystagmus. Visual fields full to confrontation. Hearing intact. Facial sensation intact.  Mild right nasolabial fold asymmetry., tongue, palate moves normally and symmetrically.  Motor: Normal bulk and tone. Normal strength in all tested extremity muscles except slight decreased right hand dexterity and grip strength and mildly increased tone Sensory.: intact to touch , pinprick , position and vibratory sensation.  Coordination: RUE mild finger-to-nose dysmetria. Normal BLE.  Orbits left arm over right arm Gait and Station: Stands from seated position with mild difficulty.  Stance slightly hunched.  Gait demonstrates slow cautious steps and mild imbalance with use of rolling walker.  Tandem walk and heel toe not attempted  Reflexes: 2+ RUE otherwise 1+ and symmetric. Toes downgoing.       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  Vascular risk factors of hypertension, hyperlipidemia and intracranial occlusive disease.     PLAN:  -Continue to participate in therapies and use of rolling walker at all times unless otherwise instructed -Continue aspirin 81 mg daily and atorvastatin 80 mg daily for secondary stroke prevention -Continue to follow closely with PCP for aggressive stroke risk factor management including BP goal<130/90 and LDL goal <70   Overall stable from stroke standpoint routinely followed by PCP; follow up as needed   CC:  Hester, January 2022., MD   I spent 32 minutes of face-to-face and non-face-to-face time with patient and sister.  This  included previsit chart review, lab review, study review, electronic health record documentation, and patient and sister education and discussion regarding history of prior stroke and secondary stroke prevention measures and aggressive stroke risk factor management, residual deficits and answered all other questions to patient and and sisters satisfaction  Sheryle Hail, AGNP-BC  [facility] [address] [place], [place]  Phone [phone] Fax [phone] Note: This document was prepared with digital dictation and possible smart phrase technology. Any transcriptional errors that result from this process are unintentional.

## 2020-11-27 NOTE — Patient Instructions (Signed)
No changes today  Continue working with therapies  Continue aspirin 81 mg daily  and atorvastatin 80 mg daily for secondary stroke prevention  Continue to follow up with PCP regarding cholesterol and blood pressure management  Maintain strict control of hypertension with blood pressure goal below 130/90 and cholesterol with LDL cholesterol (bad cholesterol) goal below 70 mg/dL.           Thank you for coming to see [place] at [facility]. I hope we have been able to provide you high quality care today.  You may receive a patient satisfaction survey over the next few weeks. We would appreciate your feedback and comments so that we may continue to improve ourselves and the health of our patients.

## 2021-07-14 IMAGING — CR DG FOOT COMPLETE 3+V*R*
3 series · 3 of 3 positions shown · non-contrast
Comparison: No prior.

CLINICAL DATA: Chronic right foot pain.

EXAM:
RIGHT FOOT COMPLETE - 3+ VIEW

[foot ap]
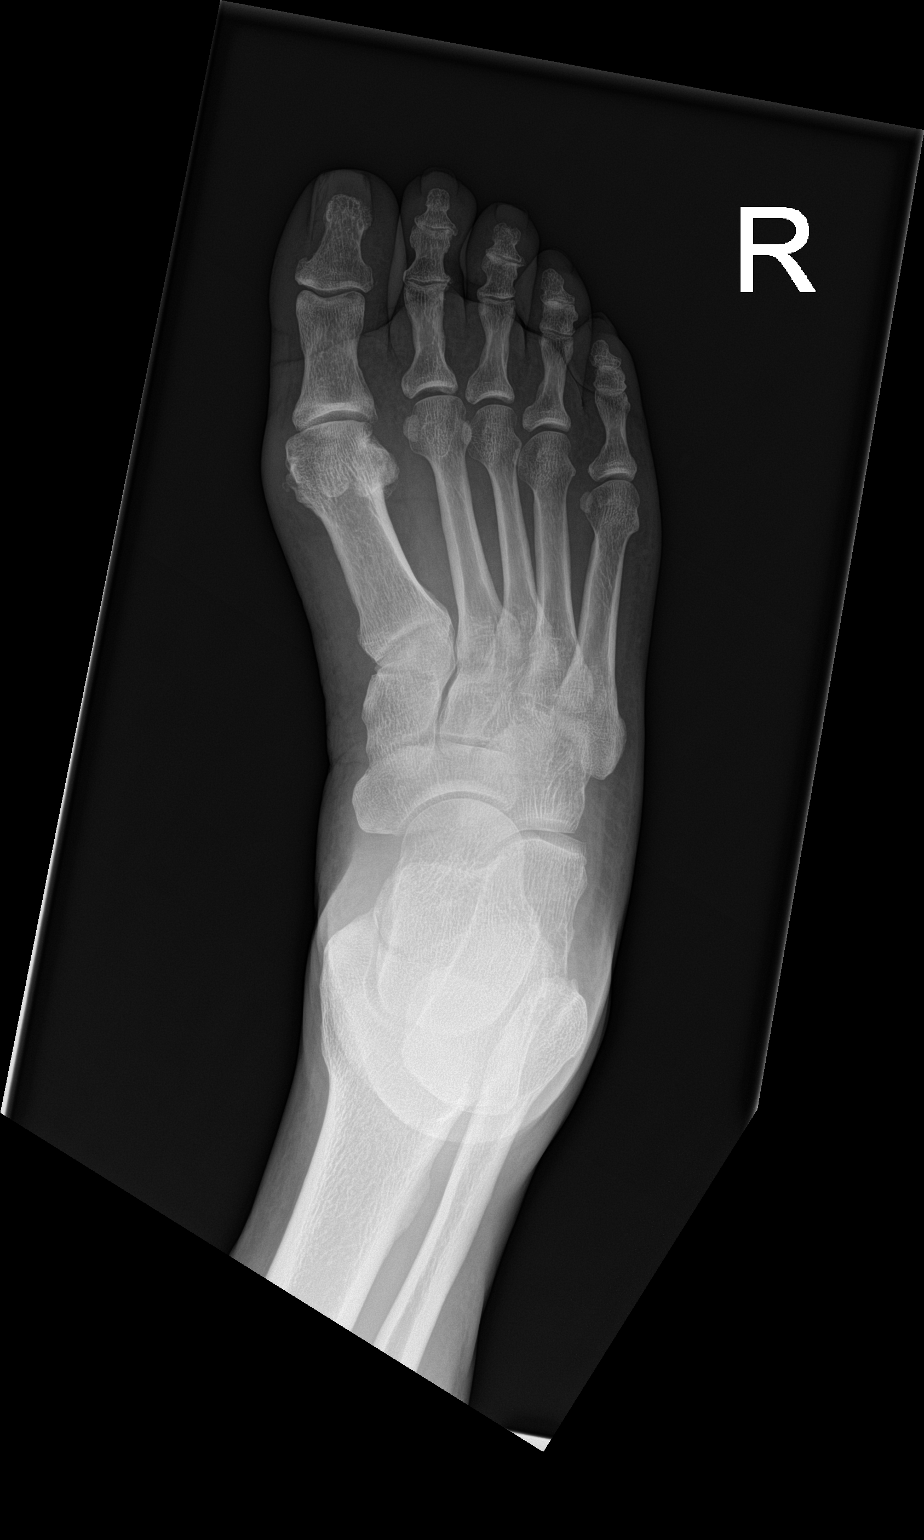

[foot obl]
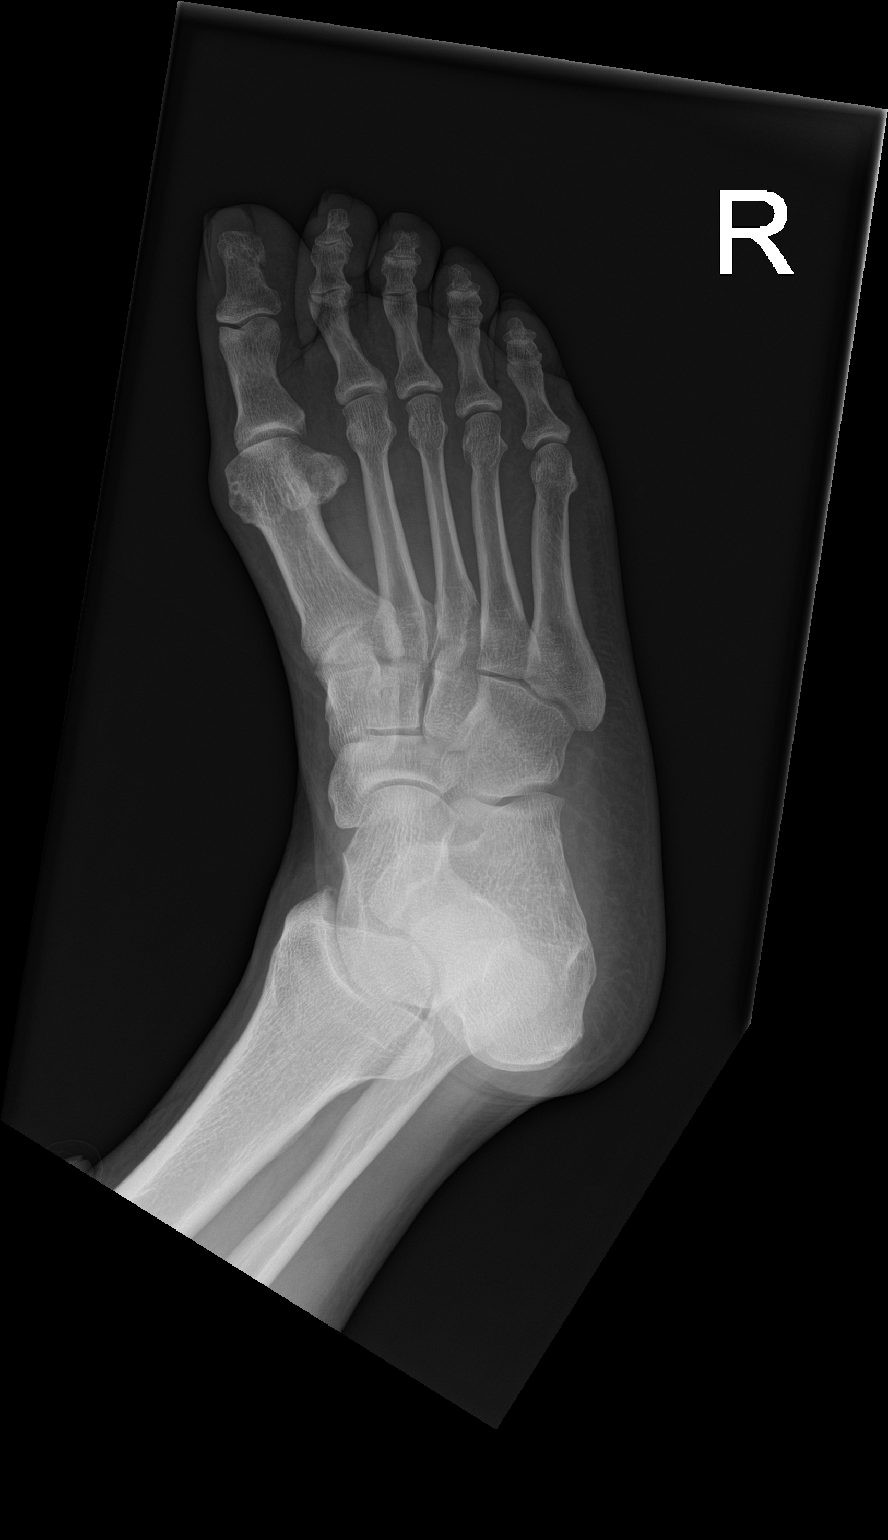

[foot lat]
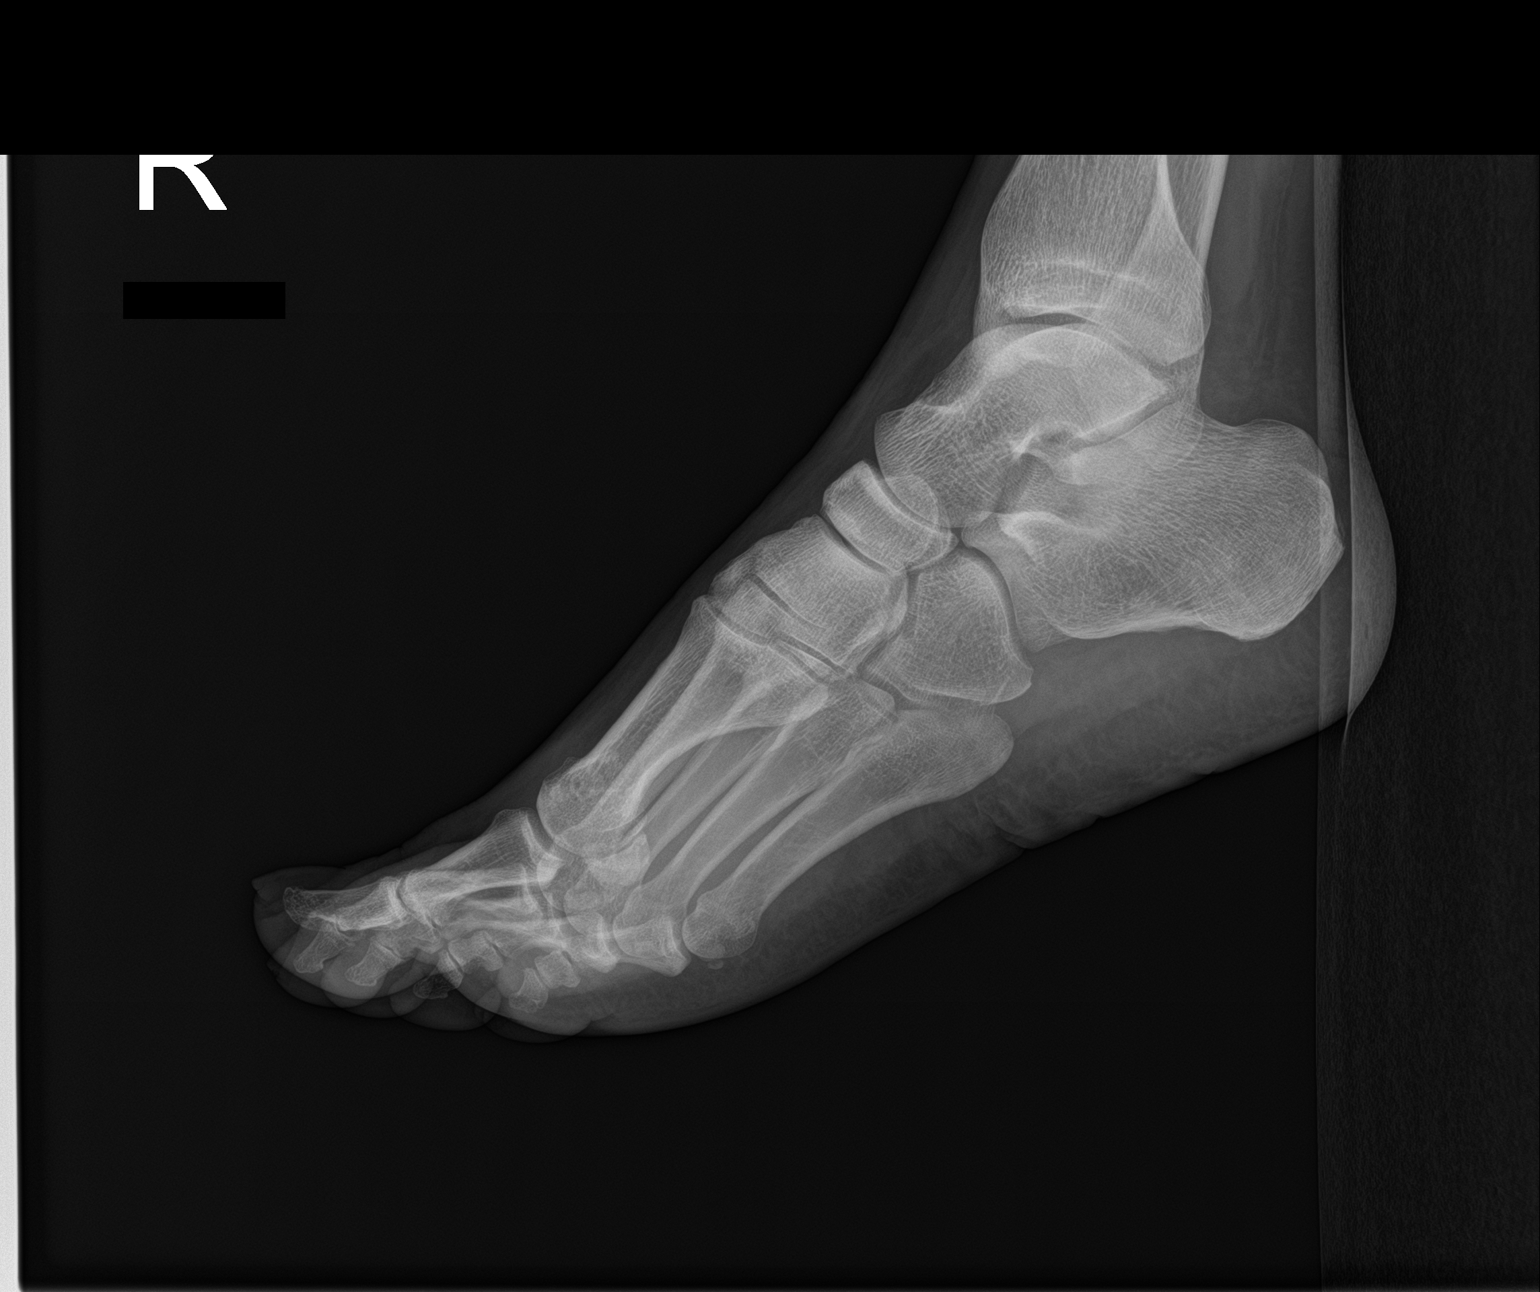

[3 of 3 positions shown; findings below may reference images not displayed]

FINDINGS: No acute bony or joint abnormality identified. No evidence of
fracture or dislocation.
IMPRESSION: No acute abnormality.
